# Patient Record
Sex: Female | Born: 1960 | Race: Black or African American | Hispanic: No | Marital: Married | Smoking: Never smoker
Health system: Southern US, Community
[De-identification: ages and names within clinical notes are randomized; demographics above are authoritative.]

## PROBLEM LIST (undated history)

## (undated) DIAGNOSIS — M51369 Other intervertebral disc degeneration, lumbar region without mention of lumbar back pain or lower extremity pain: Secondary | ICD-10-CM

## (undated) DIAGNOSIS — F4310 Post-traumatic stress disorder, unspecified: Secondary | ICD-10-CM

## (undated) DIAGNOSIS — M5136 Other intervertebral disc degeneration, lumbar region: Secondary | ICD-10-CM

## (undated) DIAGNOSIS — K219 Gastro-esophageal reflux disease without esophagitis: Secondary | ICD-10-CM

## (undated) DIAGNOSIS — M1990 Unspecified osteoarthritis, unspecified site: Secondary | ICD-10-CM

## (undated) DIAGNOSIS — H269 Unspecified cataract: Secondary | ICD-10-CM

## (undated) DIAGNOSIS — M5126 Other intervertebral disc displacement, lumbar region: Secondary | ICD-10-CM

## (undated) DIAGNOSIS — F329 Major depressive disorder, single episode, unspecified: Secondary | ICD-10-CM

## (undated) DIAGNOSIS — I10 Essential (primary) hypertension: Secondary | ICD-10-CM

## (undated) DIAGNOSIS — G4730 Sleep apnea, unspecified: Secondary | ICD-10-CM

## (undated) DIAGNOSIS — E1140 Type 2 diabetes mellitus with diabetic neuropathy, unspecified: Secondary | ICD-10-CM

## (undated) DIAGNOSIS — E7800 Pure hypercholesterolemia, unspecified: Secondary | ICD-10-CM

## (undated) DIAGNOSIS — F32A Depression, unspecified: Secondary | ICD-10-CM

## (undated) HISTORY — PX: CHOLECYSTECTOMY: SHX55

## (undated) HISTORY — DX: Unspecified cataract: H26.9

## (undated) HISTORY — PX: ABDOMINAL HYSTERECTOMY: SHX81

## (undated) HISTORY — PX: COLONOSCOPY: SHX174

---

## 1998-09-28 ENCOUNTER — Other Ambulatory Visit: Admission: RE | Admit: 1998-09-28 | Discharge: 1998-09-28 | Payer: Self-pay | Admitting: Obstetrics and Gynecology

## 1998-10-21 ENCOUNTER — Ambulatory Visit (HOSPITAL_COMMUNITY): Admission: RE | Admit: 1998-10-21 | Discharge: 1998-10-21 | Payer: Self-pay | Admitting: *Deleted

## 1999-01-19 ENCOUNTER — Inpatient Hospital Stay (HOSPITAL_COMMUNITY): Admission: RE | Admit: 1999-01-19 | Discharge: 1999-01-22 | Payer: Self-pay | Admitting: *Deleted

## 1999-02-14 ENCOUNTER — Encounter: Payer: Self-pay | Admitting: Emergency Medicine

## 1999-02-14 ENCOUNTER — Emergency Department (HOSPITAL_COMMUNITY): Admission: EM | Admit: 1999-02-14 | Discharge: 1999-02-14 | Payer: Self-pay | Admitting: Emergency Medicine

## 2000-08-22 ENCOUNTER — Encounter: Payer: Self-pay | Admitting: General Surgery

## 2000-08-22 ENCOUNTER — Encounter: Admission: RE | Admit: 2000-08-22 | Discharge: 2000-08-22 | Payer: Self-pay | Admitting: General Surgery

## 2000-09-09 ENCOUNTER — Encounter (INDEPENDENT_AMBULATORY_CARE_PROVIDER_SITE_OTHER): Payer: Self-pay | Admitting: Specialist

## 2000-09-09 ENCOUNTER — Observation Stay (HOSPITAL_COMMUNITY): Admission: RE | Admit: 2000-09-09 | Discharge: 2000-09-10 | Payer: Self-pay | Admitting: General Surgery

## 2001-02-28 ENCOUNTER — Inpatient Hospital Stay (HOSPITAL_COMMUNITY): Admission: EM | Admit: 2001-02-28 | Discharge: 2001-03-01 | Payer: Self-pay | Admitting: *Deleted

## 2001-03-01 ENCOUNTER — Encounter: Payer: Self-pay | Admitting: *Deleted

## 2001-06-09 ENCOUNTER — Other Ambulatory Visit: Admission: RE | Admit: 2001-06-09 | Discharge: 2001-06-09 | Payer: Self-pay | Admitting: *Deleted

## 2002-05-03 ENCOUNTER — Emergency Department (HOSPITAL_COMMUNITY): Admission: EM | Admit: 2002-05-03 | Discharge: 2002-05-03 | Payer: Self-pay | Admitting: Emergency Medicine

## 2002-05-03 ENCOUNTER — Encounter: Payer: Self-pay | Admitting: Emergency Medicine

## 2002-07-30 ENCOUNTER — Ambulatory Visit (HOSPITAL_COMMUNITY): Admission: RE | Admit: 2002-07-30 | Discharge: 2002-07-30 | Payer: Self-pay | Admitting: Pulmonary Disease

## 2003-03-18 ENCOUNTER — Ambulatory Visit (HOSPITAL_COMMUNITY): Admission: RE | Admit: 2003-03-18 | Discharge: 2003-03-18 | Payer: Self-pay | Admitting: Pulmonary Disease

## 2003-06-04 ENCOUNTER — Ambulatory Visit (HOSPITAL_COMMUNITY): Admission: RE | Admit: 2003-06-04 | Discharge: 2003-06-04 | Payer: Self-pay | Admitting: Pulmonary Disease

## 2003-12-15 ENCOUNTER — Other Ambulatory Visit: Admission: RE | Admit: 2003-12-15 | Discharge: 2003-12-15 | Payer: Self-pay | Admitting: *Deleted

## 2003-12-31 ENCOUNTER — Emergency Department (HOSPITAL_COMMUNITY): Admission: EM | Admit: 2003-12-31 | Discharge: 2003-12-31 | Payer: Self-pay | Admitting: Emergency Medicine

## 2004-11-17 ENCOUNTER — Inpatient Hospital Stay (HOSPITAL_COMMUNITY): Admission: EM | Admit: 2004-11-17 | Discharge: 2004-11-18 | Payer: Self-pay | Admitting: Emergency Medicine

## 2004-11-17 ENCOUNTER — Ambulatory Visit: Payer: Self-pay | Admitting: *Deleted

## 2004-11-29 ENCOUNTER — Ambulatory Visit: Payer: Self-pay | Admitting: Cardiology

## 2005-03-14 ENCOUNTER — Emergency Department (HOSPITAL_COMMUNITY): Admission: EM | Admit: 2005-03-14 | Discharge: 2005-03-14 | Payer: Self-pay | Admitting: Emergency Medicine

## 2005-05-24 ENCOUNTER — Other Ambulatory Visit: Admission: RE | Admit: 2005-05-24 | Discharge: 2005-05-24 | Payer: Self-pay | Admitting: Obstetrics and Gynecology

## 2006-02-20 ENCOUNTER — Observation Stay (HOSPITAL_COMMUNITY): Admission: AD | Admit: 2006-02-20 | Discharge: 2006-02-21 | Payer: Self-pay | Admitting: Cardiology

## 2006-02-20 ENCOUNTER — Ambulatory Visit: Payer: Self-pay | Admitting: Cardiology

## 2007-06-11 ENCOUNTER — Encounter (HOSPITAL_COMMUNITY): Payer: Self-pay | Admitting: Obstetrics and Gynecology

## 2007-06-12 ENCOUNTER — Ambulatory Visit (HOSPITAL_COMMUNITY): Admission: RE | Admit: 2007-06-12 | Discharge: 2007-06-12 | Payer: Self-pay | Admitting: Obstetrics and Gynecology

## 2008-09-20 ENCOUNTER — Ambulatory Visit (HOSPITAL_COMMUNITY): Admission: RE | Admit: 2008-09-20 | Discharge: 2008-09-20 | Payer: Self-pay | Admitting: Pulmonary Disease

## 2008-11-25 ENCOUNTER — Ambulatory Visit: Payer: Self-pay | Admitting: Internal Medicine

## 2008-12-20 ENCOUNTER — Ambulatory Visit (HOSPITAL_COMMUNITY): Admission: RE | Admit: 2008-12-20 | Discharge: 2008-12-20 | Payer: Self-pay | Admitting: Internal Medicine

## 2008-12-20 ENCOUNTER — Ambulatory Visit: Payer: Self-pay | Admitting: Internal Medicine

## 2008-12-21 ENCOUNTER — Encounter: Payer: Self-pay | Admitting: Internal Medicine

## 2008-12-21 LAB — CONVERTED CEMR LAB: Creatinine, Ser: 0.63 mg/dL (ref 0.40–1.20)

## 2008-12-24 ENCOUNTER — Ambulatory Visit (HOSPITAL_COMMUNITY): Admission: RE | Admit: 2008-12-24 | Discharge: 2008-12-24 | Payer: Self-pay | Admitting: Internal Medicine

## 2009-03-10 ENCOUNTER — Ambulatory Visit (HOSPITAL_COMMUNITY): Admission: RE | Admit: 2009-03-10 | Discharge: 2009-03-10 | Payer: Self-pay | Admitting: Pulmonary Disease

## 2009-04-12 ENCOUNTER — Encounter: Payer: Self-pay | Admitting: Internal Medicine

## 2009-05-08 ENCOUNTER — Emergency Department (HOSPITAL_COMMUNITY): Admission: EM | Admit: 2009-05-08 | Discharge: 2009-05-08 | Payer: Self-pay | Admitting: Emergency Medicine

## 2009-10-12 ENCOUNTER — Emergency Department (HOSPITAL_COMMUNITY): Admission: EM | Admit: 2009-10-12 | Discharge: 2009-10-12 | Payer: Self-pay | Admitting: Emergency Medicine

## 2009-10-23 ENCOUNTER — Emergency Department (HOSPITAL_COMMUNITY): Admission: EM | Admit: 2009-10-23 | Discharge: 2009-10-23 | Payer: Self-pay | Admitting: Emergency Medicine

## 2010-06-04 ENCOUNTER — Inpatient Hospital Stay (HOSPITAL_COMMUNITY): Admission: EM | Admit: 2010-06-04 | Discharge: 2010-06-06 | Payer: Self-pay | Admitting: Emergency Medicine

## 2010-07-03 ENCOUNTER — Ambulatory Visit (HOSPITAL_COMMUNITY): Admission: RE | Admit: 2010-07-03 | Discharge: 2010-07-03 | Payer: Self-pay | Admitting: Family Medicine

## 2010-07-28 ENCOUNTER — Ambulatory Visit: Admission: RE | Admit: 2010-07-28 | Discharge: 2010-07-28 | Payer: Self-pay | Admitting: Pulmonary Disease

## 2010-08-05 ENCOUNTER — Emergency Department (HOSPITAL_COMMUNITY): Admission: EM | Admit: 2010-08-05 | Discharge: 2010-08-05 | Payer: Self-pay | Admitting: Emergency Medicine

## 2010-10-26 ENCOUNTER — Emergency Department (HOSPITAL_COMMUNITY): Admission: EM | Admit: 2010-10-26 | Discharge: 2010-10-26 | Payer: Self-pay | Admitting: Emergency Medicine

## 2011-02-22 LAB — URINE MICROSCOPIC-ADD ON

## 2011-02-22 LAB — CBC
HCT: 42 % (ref 36.0–46.0)
Hemoglobin: 13.7 g/dL (ref 12.0–15.0)
MCH: 26.5 pg (ref 26.0–34.0)
MCHC: 32.5 g/dL (ref 30.0–36.0)
MCV: 81.7 fL (ref 78.0–100.0)
Platelets: 290 10*3/uL (ref 150–400)
RBC: 5.15 MIL/uL — ABNORMAL HIGH (ref 3.87–5.11)
RDW: 14.8 % (ref 11.5–15.5)
WBC: 6.9 10*3/uL (ref 4.0–10.5)

## 2011-02-22 LAB — URINALYSIS, ROUTINE W REFLEX MICROSCOPIC
Bilirubin Urine: NEGATIVE
Glucose, UA: >1000 mg/dL — AB
Hgb urine dipstick: NEGATIVE
Ketones, ur: NEGATIVE mg/dL
Leukocytes, UA: NEGATIVE
Nitrite: NEGATIVE
Protein, ur: NEGATIVE mg/dL
Specific Gravity, Urine: 1.025 (ref 1.005–1.030)
Urobilinogen, UA: 0.2 mg/dL (ref 0.0–1.0)
pH: 5.5 (ref 5.0–8.0)

## 2011-02-22 LAB — DIFFERENTIAL
Basophils Absolute: 0 10*3/uL (ref 0.0–0.1)
Basophils Relative: 0 % (ref 0–1)
Eosinophils Absolute: 0 10*3/uL (ref 0.0–0.7)
Eosinophils Relative: 0 % (ref 0–5)
Lymphocytes Relative: 25 % (ref 12–46)
Lymphs Abs: 1.7 10*3/uL (ref 0.7–4.0)
Monocytes Absolute: 0.3 10*3/uL (ref 0.1–1.0)
Monocytes Relative: 5 % (ref 3–12)
Neutro Abs: 4.8 10*3/uL (ref 1.7–7.7)
Neutrophils Relative %: 70 % (ref 43–77)

## 2011-02-22 LAB — COMPREHENSIVE METABOLIC PANEL
ALT: 19 U/L (ref 0–35)
AST: 23 U/L (ref 0–37)
Albumin: 3.7 g/dL (ref 3.5–5.2)
Alkaline Phosphatase: 69 U/L (ref 39–117)
BUN: 10 mg/dL (ref 6–23)
CO2: 24 mEq/L (ref 19–32)
Calcium: 8.8 mg/dL (ref 8.4–10.5)
Chloride: 97 mEq/L (ref 96–112)
Creatinine, Ser: 0.72 mg/dL (ref 0.4–1.2)
GFR calc Af Amer: >60 mL/min (ref >60)
GFR calc non Af Amer: >60 mL/min (ref >60)
Glucose, Bld: 260 mg/dL — ABNORMAL HIGH (ref 70–99)
Potassium: 3.5 mEq/L (ref 3.5–5.1)
Sodium: 131 mEq/L — ABNORMAL LOW (ref 135–145)
Total Bilirubin: 0.4 mg/dL (ref 0.3–1.2)
Total Protein: 7.5 g/dL (ref 6.0–8.3)

## 2011-02-22 LAB — LIPASE, BLOOD: Lipase: 25 U/L (ref 11–59)

## 2011-02-25 LAB — GLUCOSE, CAPILLARY
Glucose-Capillary: 224 mg/dL — ABNORMAL HIGH (ref 70–99)
Glucose-Capillary: 239 mg/dL — ABNORMAL HIGH (ref 70–99)
Glucose-Capillary: 272 mg/dL — ABNORMAL HIGH (ref 70–99)
Glucose-Capillary: 280 mg/dL — ABNORMAL HIGH (ref 70–99)
Glucose-Capillary: 288 mg/dL — ABNORMAL HIGH (ref 70–99)

## 2011-02-25 LAB — BASIC METABOLIC PANEL
BUN: 9 mg/dL (ref 6–23)
CO2: 26 mEq/L (ref 19–32)
Calcium: 8.8 mg/dL (ref 8.4–10.5)
Chloride: 100 mEq/L (ref 96–112)
Creatinine, Ser: 0.68 mg/dL (ref 0.4–1.2)
GFR calc Af Amer: >60 mL/min (ref >60)
GFR calc non Af Amer: >60 mL/min (ref >60)
Glucose, Bld: 214 mg/dL — ABNORMAL HIGH (ref 70–99)
Potassium: 3.9 mEq/L (ref 3.5–5.1)
Sodium: 132 mEq/L — ABNORMAL LOW (ref 135–145)

## 2011-02-25 LAB — PROTIME-INR
INR: 0.94 (ref 0.00–1.49)
Prothrombin Time: 12.8 seconds (ref 11.6–15.2)

## 2011-02-25 LAB — CBC
HCT: 37.5 % (ref 36.0–46.0)
Hemoglobin: 12.6 g/dL (ref 12.0–15.0)
MCH: 27.3 pg (ref 26.0–34.0)
MCHC: 33.6 g/dL (ref 30.0–36.0)
MCV: 81.4 fL (ref 78.0–100.0)
Platelets: 235 10*3/uL (ref 150–400)
RBC: 4.61 MIL/uL (ref 3.87–5.11)
RDW: 14.4 % (ref 11.5–15.5)
WBC: 5.3 10*3/uL (ref 4.0–10.5)

## 2011-02-25 LAB — LIPID PANEL
HDL: 50 mg/dL (ref >39)
Total CHOL/HDL Ratio: 4.9 RATIO
Triglycerides: 286 mg/dL — ABNORMAL HIGH (ref <150)
VLDL: 57 mg/dL — ABNORMAL HIGH (ref 0–40)

## 2011-02-25 LAB — DIFFERENTIAL
Basophils Absolute: 0 10*3/uL (ref 0.0–0.1)
Basophils Relative: 1 % (ref 0–1)
Eosinophils Absolute: 0.1 10*3/uL (ref 0.0–0.7)
Eosinophils Relative: 1 % (ref 0–5)
Lymphocytes Relative: 45 % (ref 12–46)
Lymphs Abs: 2.4 10*3/uL (ref 0.7–4.0)
Monocytes Absolute: 0.4 10*3/uL (ref 0.1–1.0)
Monocytes Relative: 8 % (ref 3–12)
Neutro Abs: 2.4 10*3/uL (ref 1.7–7.7)
Neutrophils Relative %: 45 % (ref 43–77)

## 2011-02-25 LAB — POCT CARDIAC MARKERS: CKMB, poc: <1 ng/mL — ABNORMAL LOW (ref 1.0–8.0)

## 2011-02-25 LAB — APTT: aPTT: 29 seconds (ref 24–37)

## 2011-03-05 ENCOUNTER — Emergency Department (HOSPITAL_COMMUNITY)
Admission: EM | Admit: 2011-03-05 | Discharge: 2011-03-06 | Disposition: A | Discharge status: Home / Self Care | Payer: Medicare Other | Attending: Emergency Medicine | Admitting: Emergency Medicine

## 2011-03-05 DIAGNOSIS — M549 Dorsalgia, unspecified: Secondary | ICD-10-CM | POA: Insufficient documentation

## 2011-03-05 DIAGNOSIS — R197 Diarrhea, unspecified: Secondary | ICD-10-CM | POA: Insufficient documentation

## 2011-03-05 DIAGNOSIS — Z79899 Other long term (current) drug therapy: Secondary | ICD-10-CM | POA: Insufficient documentation

## 2011-03-05 DIAGNOSIS — R112 Nausea with vomiting, unspecified: Secondary | ICD-10-CM | POA: Insufficient documentation

## 2011-03-05 LAB — COMPREHENSIVE METABOLIC PANEL
AST: 23 U/L (ref 0–37)
Albumin: 3.7 g/dL (ref 3.5–5.2)
BUN: 13 mg/dL (ref 6–23)
Chloride: 98 mEq/L (ref 96–112)
Creatinine, Ser: 0.79 mg/dL (ref 0.4–1.2)
GFR calc Af Amer: >60 mL/min (ref >60)
Potassium: 4.3 mEq/L (ref 3.5–5.1)
Total Protein: 7.5 g/dL (ref 6.0–8.3)

## 2011-03-05 LAB — CBC
MCH: 26.5 pg (ref 26.0–34.0)
MCV: 79.6 fL (ref 78.0–100.0)
Platelets: 237 10*3/uL (ref 150–400)
RBC: 5.2 MIL/uL — ABNORMAL HIGH (ref 3.87–5.11)
RDW: 13.8 % (ref 11.5–15.5)
WBC: 6.3 10*3/uL (ref 4.0–10.5)

## 2011-03-05 LAB — DIFFERENTIAL
Basophils Relative: 0 % (ref 0–1)
Eosinophils Absolute: 0 10*3/uL (ref 0.0–0.7)
Eosinophils Relative: 0 % (ref 0–5)
Lymphs Abs: 0.4 10*3/uL — ABNORMAL LOW (ref 0.7–4.0)
Neutrophils Relative %: 91 % — ABNORMAL HIGH (ref 43–77)

## 2011-03-14 LAB — BASIC METABOLIC PANEL
BUN: 8 mg/dL (ref 6–23)
Chloride: 100 mEq/L (ref 96–112)
Chloride: 104 mEq/L (ref 96–112)
GFR calc Af Amer: >60 mL/min (ref >60)
GFR calc non Af Amer: >60 mL/min (ref >60)
GFR calc non Af Amer: >60 mL/min (ref >60)
Potassium: 3.4 mEq/L — ABNORMAL LOW (ref 3.5–5.1)
Potassium: 3.7 mEq/L (ref 3.5–5.1)
Sodium: 132 mEq/L — ABNORMAL LOW (ref 135–145)
Sodium: 136 mEq/L (ref 135–145)

## 2011-03-14 LAB — DIFFERENTIAL
Eosinophils Relative: 2 % (ref 0–5)
Eosinophils Relative: 2 % (ref 0–5)
Lymphocytes Relative: 44 % (ref 12–46)
Lymphocytes Relative: 55 % — ABNORMAL HIGH (ref 12–46)
Lymphs Abs: 1.8 10*3/uL (ref 0.7–4.0)
Lymphs Abs: 1.8 10*3/uL (ref 0.7–4.0)
Monocytes Absolute: 0.2 10*3/uL (ref 0.1–1.0)
Monocytes Relative: 5 % (ref 3–12)
Monocytes Relative: 9 % (ref 3–12)

## 2011-03-14 LAB — URINALYSIS, ROUTINE W REFLEX MICROSCOPIC
Bilirubin Urine: NEGATIVE
Glucose, UA: >1000 mg/dL — AB
Glucose, UA: NEGATIVE mg/dL
Hgb urine dipstick: NEGATIVE
Hgb urine dipstick: NEGATIVE
Ketones, ur: NEGATIVE mg/dL
Ketones, ur: NEGATIVE mg/dL
Leukocytes, UA: NEGATIVE
pH: 5.5 (ref 5.0–8.0)
pH: 5.5 (ref 5.0–8.0)

## 2011-03-14 LAB — CBC
HCT: 38.1 % (ref 36.0–46.0)
HCT: 39.2 % (ref 36.0–46.0)
Hemoglobin: 13 g/dL (ref 12.0–15.0)
Hemoglobin: 13.3 g/dL (ref 12.0–15.0)
MCV: 80.2 fL (ref 78.0–100.0)
MCV: 80.6 fL (ref 78.0–100.0)
Platelets: 220 10*3/uL (ref 150–400)
RBC: 4.75 MIL/uL (ref 3.87–5.11)
RBC: 4.87 MIL/uL (ref 3.87–5.11)
WBC: 3.2 10*3/uL — ABNORMAL LOW (ref 4.0–10.5)
WBC: 4 10*3/uL (ref 4.0–10.5)

## 2011-03-14 LAB — GLUCOSE, CAPILLARY
Glucose-Capillary: 158 mg/dL — ABNORMAL HIGH (ref 70–99)
Glucose-Capillary: 169 mg/dL — ABNORMAL HIGH (ref 70–99)
Glucose-Capillary: 283 mg/dL — ABNORMAL HIGH (ref 70–99)

## 2011-03-14 LAB — KETONES, QUALITATIVE: Acetone, Bld: NEGATIVE

## 2011-03-20 LAB — BASIC METABOLIC PANEL
CO2: 24 mEq/L (ref 19–32)
Chloride: 100 mEq/L (ref 96–112)
GFR calc Af Amer: >60 mL/min (ref >60)
Sodium: 134 mEq/L — ABNORMAL LOW (ref 135–145)

## 2011-03-20 LAB — URINALYSIS, ROUTINE W REFLEX MICROSCOPIC
Bilirubin Urine: NEGATIVE
Glucose, UA: >1000 mg/dL — AB
Ketones, ur: NEGATIVE mg/dL
Leukocytes, UA: NEGATIVE
Nitrite: NEGATIVE
Protein, ur: NEGATIVE mg/dL
Specific Gravity, Urine: 1.02 (ref 1.005–1.030)
Urobilinogen, UA: 0.2 mg/dL (ref 0.0–1.0)
pH: 5.5 (ref 5.0–8.0)

## 2011-03-20 LAB — DIFFERENTIAL
Basophils Absolute: 0 K/uL (ref 0.0–0.1)
Basophils Relative: 1 % (ref 0–1)
Eosinophils Absolute: 0.1 K/uL (ref 0.0–0.7)
Eosinophils Relative: 2 % (ref 0–5)
Lymphocytes Relative: 50 % — ABNORMAL HIGH (ref 12–46)
Lymphs Abs: 2.3 K/uL (ref 0.7–4.0)
Monocytes Absolute: 0.3 K/uL (ref 0.1–1.0)
Monocytes Relative: 7 % (ref 3–12)
Neutro Abs: 1.9 K/uL (ref 1.7–7.7)
Neutrophils Relative %: 40 % — ABNORMAL LOW (ref 43–77)

## 2011-03-20 LAB — GLUCOSE, CAPILLARY: Glucose-Capillary: 281 mg/dL — ABNORMAL HIGH (ref 70–99)

## 2011-03-20 LAB — CBC
HCT: 38.3 % (ref 36.0–46.0)
Hemoglobin: 13.2 g/dL (ref 12.0–15.0)
MCHC: 34.4 g/dL (ref 30.0–36.0)
MCV: 80.6 fL (ref 78.0–100.0)
Platelets: 224 K/uL (ref 150–400)
RBC: 4.75 MIL/uL (ref 3.87–5.11)
RDW: 14.1 % (ref 11.5–15.5)
WBC: 4.7 K/uL (ref 4.0–10.5)

## 2011-03-20 LAB — URINE MICROSCOPIC-ADD ON

## 2011-03-20 LAB — WET PREP, GENITAL
Trich, Wet Prep: NONE SEEN
WBC, Wet Prep HPF POC: NONE SEEN

## 2011-03-20 LAB — SYPHILIS: RPR W/REFLEX TO RPR TITER AND TREPONEMAL ANTIBODIES, TRADITIONAL SCREENING AND DIAGNOSIS ALGORITHM: RPR Ser Ql: NONREACTIVE

## 2011-03-26 LAB — GLUCOSE, CAPILLARY: Glucose-Capillary: 203 mg/dL — ABNORMAL HIGH (ref 70–99)

## 2011-04-24 NOTE — Op Note (Signed)
NAMESTEPHANI, Hannah Schultz              ACCOUNT NO.:  [id number]   MEDICAL RECORD NO.:  [id number]          PATIENT TYPE:  AMB   LOCATION:  [place]                           FACILITY:  [facility]   PHYSICIAN:  Marylynn Pearson, MD    DATE OF BIRTH:  1961-11-09   DATE OF PROCEDURE:  06/12/2007  DATE OF DISCHARGE:                               OPERATIVE REPORT   PREOPERATIVE DIAGNOSIS:  Right labial nodules.   POSTOPERATIVE DIAGNOSIS:  Right labial cysts x2.   PROCEDURE:  Excision of two right labial cysts.   SURGEON:  Marylynn Pearson, M.D.   FINDINGS:  Inferior right labial cyst with thick chocolate-like  substance, superior cyst excised intact with clear fluid within.   ANESTHESIA:  General.   SPECIMEN:  Right labial cyst and cyst wall.   ESTIMATED BLOOD LOSS:  Minimal.   COMPLICATIONS:  None.   CONDITION:  Stable and extubated to recovery room.   PROCEDURE IN DETAIL:  Hannah Schultz was taken to the operating room where  general anesthesia was obtained. She was placed in the dorsal lithotomy  position using Allen stirrups.  She was prepped and draped in sterile  fashion and a catheter was used to drain her bladder for approximately  50 mL of clear urine.  Right labial cysts were identified.  Two Allis  clamps were placed on either end of the inferior right labial cyst and  incision was made with the scalpel.  The cyst wall was separated from  the overlying epithelium.  During manipulation of the cyst, the cyst  ruptured and drained a thick chocolate looking substance.  The cyst wall  was then removed and our attention was turned to the superior right  labial cyst. Again, approximately 5 mm in size, two Allis clamps were  placed on either and an incision was made atop the cyst and the cyst was  excised using blunt and sharp dissection with Metzenbaum scissors.  The  cyst was excised intact.  Both were placed on Telfa and sent off for  pathology.  Due to the proximity of the superior  right  labial cyst to the urethra, a catheter was placed again in the  bladder.  The epithelium was reapproximated using a 3-0 Vicryl Rapide in  a running locked fashion.  Hemostasis was assured, the catheter was  easily removed, and the patient was taken to the recovery room in stable  condition.      Marylynn Pearson, MD  Electronically Signed     GA/MEDQ  D:  06/12/2007  T:  06/12/2007  Job:  [id number]

## 2011-04-24 NOTE — Op Note (Signed)
NAMECORINE, Hannah Schultz              ACCOUNT NO.:  [id number]   MEDICAL RECORD NO.:  [id number]          PATIENT TYPE:  AMB   LOCATION:  DAY                           FACILITY:  [facility]   PHYSICIAN:  R. Garfield Cornea, M.D. DATE OF BIRTH:  11-22-1961   DATE OF PROCEDURE:  DATE OF DISCHARGE:                               OPERATIVE REPORT   INDICATIONS FOR PROCEDURE:  A 50 year old lady with burning left lower  quadrant abdominal pain.  Otherwise, she does not have any GI symptoms.  Colonoscopy is now being done.  Family history is positive for rectal  polyps in her sister at age 76.  Colonoscopy is now being done.  Risks,  benefits, alternatives, limitations have been reviewed, questions  answered.  She is agreeable.  Please see the documentation in the  medical record.   PROCEDURE NOTE:  O2 saturation, blood pressure, pulse, respirations were  monitored throughout the entire procedure.   CONSCIOUS SEDATION:  Versed 2 mg IV, Demerol 50 mg IV, in a single dose.   INSTRUMENT:  Pentax video chip system.   FINDINGS:  Digital rectal exam revealed no abnormalities.  Endoscopic  findings:  Prep was good.  Colon:  Colonic mucosa was surveyed from the  rectosigmoid junction through the left transverse, right colon,  appendiceal orifice, ileocecal valve, and cecum.  These structures were  well seen and photographed for the record.  Terminal ileum will be  intubated 5 cm.  From this level, scope was withdrawn.  All previously  mentioned mucosal surfaces were again seen.  The patient had few  scattered pancolonic diverticula.  Remainder of colonic mucosa appeared  normal.  Scope was pulled down the rectum.  A thorough examination of  the rectal mucosa including retroflexed view of the anal verge  demonstrated no abnormalities.  The patient tolerated the procedure very  well and was reactive to Endoscopy.   IMPRESSION:  Normal rectum, very few scattered pancolonic diverticula,  and colonic mucosa  appeared normal.  There was really nothing in her  colon that would explain her symptoms which may be neuropathic in  origin.  She does have a diabetic neuropathy and is already on  Neurontin.   RECOMMENDATIONS:  We will do an abdominopelvic CT with IV normal  contrast.  If this is not revealing for any underlying pathology which  could be attributable to her left-sided abdominal pain, then I would  then recommended she be seen by a pain management specialist.      Bridgette Habermann, M.D.  Electronically Signed     RMR/MEDQ  D:  12/20/2008  T:  12/21/2008  Job:  [id number]   cc:   Percell Miller L. Luan Pulling, M.D.  Fax: [phone]

## 2011-04-24 NOTE — H&P (Signed)
[name], [name]              ACCOUNT NO.:  [id number]   MEDICAL RECORD NO.:  [id number]          PATIENT TYPE:  AMB   LOCATION:  DAY                           FACILITY:  [facility]   PHYSICIAN:  R. Garfield Cornea, M.D. DATE OF BIRTH:  12/30/1960   DATE OF ADMISSION:  DATE OF DISCHARGE:  LH                              HISTORY & PHYSICAL   HISTORY OF PRESENT ILLNESS:  [name] is a very pleasant 50-  year-old obese African American female with long-standing poorly-  controlled diabetes mellitus, who presents today in referral from Dr.  Luan Pulling to further evaluate a 2-46-month history of left mid midabdomen  burning.  She describes this pain like someone pouring gasoline on her  left abdomen saying it blazed.  It is a hot burning sensation.  It does  not have any radiation.  It is localized to the left midabdomen where  she places her palm, does not come around from the back, is not  associated with movement, bowel function, or eating.  In fact, she has  no bowel symptoms.  No constipation, diarrhea, melena, or hematochezia.  No odynophagia, dysphagia, or sign of reflux symptoms.  She is  significantly over her ideal body weight.  She has diabetes for years  and has a hemoglobin A1c recently of 8.9.  She did not bring her  medication list with her and really cannot tell me what she is taking at  this point in time.  She also has similar burning in both hands, chest,  and lower extremities in a stocking glove distribution and occasionally  her legs go out from under her and she falls.  She had been told this is  related to her diabetes and she does have neuropathy.  There is no prior  history gastrointestinal illness.  She is status post cholecystectomy,  hysterectomy, ovarian cyst removal, removed in [place].  She was  having some rectal bleeding.   CURRENT MEDICATIONS:  Not known.  She is not on Coumadin.  She is not  sure if she is taking Neurontin.  To be reviewed.   ALLERGIES:  No known drug allergies.   FAMILY HISTORY:  Sister has rectal polyps at age 38.  No history of  colorectal cancer.  Mother with a history of CVA.  Father with history  of leukemia.   SOCIAL HISTORY:  The patient is married, has 2 healthy sons aged 22 and  45.  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  No tobacco.  No alcohol.   REVIEW OF SYSTEMS:  No chest pain, dyspnea on exertion.  No fever,  chills.   PHYSICAL EXAMINATION:  GENERAL:  Pleasant obese 50 year old [place]  American female, resting comfortably, 264, height 5 feet 5.  VITAL SIGNS:  Temperature 98.1, BP 130/78, pulse 72.  SKIN:  Warm, pink, and dry.  HEENT:  No scleral icterus.  JVD is not prominent.  CHEST:  Lungs are clear to auscultation.  CARDIAC:  Regular rate and rhythm without murmur, gallop, or rub.  BREAST:  Deferred.  ABDOMEN:  Obese,  positive bowel sounds, soft and nontender.  I  appreciate no rash or skin abnormalities.  No appreciable  hepatosplenomegaly or mass.  BACK:  There is no CVA tenderness.  EXTREMITIES:  No edema.  RECTAL:  Deferred colonoscopy.   IMPRESSION:  [name] is a pleasant 50 year old obese diabetic  with poorly controlled blood sugars, has a history of neuropathy, who  has had a 2-48-month history of nonradiating unremitting burning left-  sided abdominal wall pain.  I doubt this is a herpes zoster.  I doubt  this is a sign of any visceral GI pathology more likely this is part of  partial diabetic neuropathy.  I doubt that she has a diskogenic pain.   It is notable, however, that her sister has a history of rectal polyps  which were removed recently and [name] is now at the threshold  screening age for African Americans for colorectal cancer screening,  although I do not think at all there is anything in her colon that would  be contributing to her symptoms of left-sided abdominal pain.   RECOMMENDATIONS:  We will go  ahead and review her medications when they  become available.  I have offered [name] a screening colonoscopy in  the very near future.  I spent some time with her recommending the  importance of weight loss, exercise, and maximization of glycemic  control.  We will need to see whether or not she has been on Neurontin.  We consider a course of Lidoderm patch and therapy and potentially a  referral to a Pain Management Center.  We will make further  recommendations once I reviewed her medication list and an opportunity  to perform a colonoscopy.   I would like to thank Dr. Sinda Du for allowing me to see this  very nice lady in consultation.      Bridgette Habermann, M.D.  Electronically Signed     RMR/MEDQ  D:  11/25/2008  T:  11/26/2008  Job:  [id number]

## 2011-04-27 NOTE — Discharge Summary (Signed)
[name], Hannah Schultz              ACCOUNT NO.:  [id number]   MEDICAL RECORD NO.:  [id number]          PATIENT TYPE:  INP   LOCATION:  3709                         FACILITY:  [facility]   PHYSICIAN:  Kirk Ruths, M.D. LHCDATE OF BIRTH:  1961/05/26   DATE OF ADMISSION:  11/17/2004  DATE OF DISCHARGE:  11/18/2004                                 DISCHARGE SUMMARY   DISCHARGE DIAGNOSES:  1.  Chest pain, etiology unclear.  2.  Insignificant coronary artery disease by catheterization this admission.  3.  Good left ventricular function.  4.  Elevated D-dimer with chest CT negative for pulmonary embolism this      admission.  5.  Hypertension.  6.  History of panic attacks.   HOSPITAL COURSE:  Please seen consultation note from November 17, 2004.  Briefly, this 50 year old patient was transferred from [facility]  on November 17, 2004, for further evaluation of chest discomfort.  Her  cardiac enzymes were negative for myocardial infarction.  She had an  echocardiogram performed at [facility] that showed distal  anteroseptal hypokinesis and good LV function.  She was transferred to [facility] for cardiac catheterization.  This was done by Dr. Vicenta Aly  on December 9.  She had some spasm in the ostial circumflex relieved with  intracoronary nitroglycerin.  She had distal 30% stenosis in the LAD.  She  had no significant coronary artery disease by catheterization.  D-dimer was  checked.  This was elevated at 0.64.  The patient underwent chest CT on  November 18, 2004.  This showed no pulmonary embolus, linear atelectasis in  the lower lobes.  Therefore, it was felt the patient was stable enough for  discharge to home. She will resume her same home medications and follow up  with the PA for Dr. Wilhemina Cash in [place] in a couple of weeks.   The patient had hypokalemia upon admission at [facility] and was  placed on potassium supplementation.  This will be  discontinued at  discharge, and the patient has been asked to eat potassium-containing foods  at home to help her potassium level stay normal.   LABORATORY AND X-RAY DATA:  White count 3900, hemoglobin 11.3, hematocrit  34.1, platelet count 225,000.  D-dimer as noted above.  INR 0.9.  Sodium  139, potassium 4, chloride 107, CO2 20, glucose 102, BUN 6, creatinine 0.8.  Total bilirubin 0.7, alkaline phosphatase 54, AST 25, ALT 20, total protein  6.3, albumin 3.2, calcium 8.4. Total cholesterol 202, triglycerides 207, HDL  46, LDL 102.  TSH 0.902.   Chest CT as noted above.   Chest x-ray from December 8:  No acute cardiopulmonary findings.   DISCHARGE MEDICATIONS:  Mavik 1 mg daily.   PAIN MANAGEMENT:  Tylenol as needed.   ACTIVITY:  No driving, heavy lifting, exertion, work, or sex for two days.   DIET:  Low-fat, low-sodium.  He has been advised to eat an extra banana  every other day to keep potassium level up.   WOUND CARE:  The patient is to call our office in [place]  for any groin  swelling, pain, or bruising.   FOLLOW UP:  She can see Dr. Luan Pulling as needed.  She will see the physician  assistant for Dr. Wilhemina Cash on Wednesday, December 21, at 1 p.m.       SW/MEDQ  D:  11/18/2004  T:  11/18/2004  Job:  [id number]   cc:   Percell Miller L. Luan Pulling, M.D.  [address]  [place]  [place] [address]  Fax: [phone]   Scarlett Presto, M.D.  Fax: [phone]

## 2011-04-27 NOTE — Cardiovascular Report (Signed)
Hannah Schultz, Hannah Schultz              ACCOUNT NO.:  [id number]   MEDICAL RECORD NO.:  [id number]          PATIENT TYPE:  INP   LOCATION:  3709                         FACILITY:  [facility]   PHYSICIAN:  Junious Silk, M.D. LHCDATE OF BIRTH:  12-13-1960   DATE OF PROCEDURE:  11/17/2004  DATE OF DISCHARGE:                              CARDIAC CATHETERIZATION   PROCEDURE PERFORMED:  Left heart catheterization with coronary angiography  and left ventriculography.   INDICATION:  Hannah Schultz is a 50 year old woman with cardiac risk factors.  She was admitted to [facility] with substernal chest pain.  Cardiac  markers showed an elevation of total CK and CK-MB.  However, the relative  index of the CK-MB was less than 3.  Troponin levels were all normal.  An  echocardiogram was read by Dr. Wilhemina Cash and felt to show a possible anterior  septal hypokinesis.  She was therefore referred to [facility] for  cardiac catheterization to rule out coronary artery disease.   CATHETERIZATION PROCEDURAL NOTE:  A 6 French sheath was placed in the right  femoral artery.  The patient had a very short left main coronary artery and  our catheter tended to subselect the left circumflex.  We initially  performed left diagnostic images with a 6 French JL-3.5 catheter.  We then  switched out to a JL-3 guiding catheter for better visualization of the left  anterior descending artery.  The right coronary artery was imaged with a JR-  4 catheter.  Left ventriculography was performed with angled pigtail  catheter.  Contrast was Omnipaque.  There were no complications.   CATHETERIZATION RESULTS:   HEMODYNAMICS:  1.  Left ventricular pressure 126/18.  2.  Aortic pressure 126/84.  3.  There is no aortic valve gradient.   LEFT VENTRICULOGRAM:  Wall motion is normal.  Ejection fraction estimated at  greater than or equal to 65%.  There is no mitral regurgitation.   CORONARY ARTERIOGRAPHY (RIGHT DOMINANT):   Left main is short, but normal.   Left anterior descending artery is a very tortuous vessel.  It gives rise to  two small diagonal branches.  The distal LAD has a tubular 30% stenosis.   Left circumflex had some catheter related spasm in the ostium which was  relieved with intracoronary nitroglycerin.  Circumflex was otherwise normal  giving rise to a large first obtuse marginal, normal size second obtuse  marginal, and a small posterior lateral branch.   Right coronary artery is a dominant vessel giving rise to a large posterior  descending and a very small posterior lateral branch.   IMPRESSION:  1.  Normal left ventricular systolic function.  2.  No significant coronary artery disease identified.   PLAN:  Alternative etiologies for the patient's chest pain will be  investigated.      Mark   MWP/MEDQ  D:  11/17/2004  T:  11/18/2004  Job:  [id number]   cc:   Percell Miller L. Luan Pulling, M.D.  [address]  [place]  [place] [address]  Fax: [phone]   Scarlett Presto, M.D.  Fax: [phone]

## 2011-04-27 NOTE — Consult Note (Signed)
Hannah, Schultz              ACCOUNT NO.:  [id number]   MEDICAL RECORD NO.:  [id number]          PATIENT TYPE:  INP   LOCATION:  IC07                          FACILITY:  [facility]   PHYSICIAN:  Scarlett Presto, M.D.   DATE OF BIRTH:  03/25/1961   DATE OF CONSULTATION:  11/17/2004  DATE OF DISCHARGE:                                   CONSULTATION   PRIMARY CARE PHYSICIAN:  Dr. Sinda Du.   HISTORY OF PRESENT ILLNESS:  [name] is a 50 year old female with no  known coronary artery disease who presents to [facility] with chest  discomfort.  She has been having intermittent discomfort for the last three  days.  She notes deep pressure substernally with occasional sharp pain with  shortness of breath, nausea, diaphoresis.  She reports the pain is  progressive over the last couple of days.  It got so bad she reported to the  emergency department where she was admitted, evaluated and started on  nitroglycerin drip.  The pain worsens with exertion.  It does radiate down  both of her arms. It is associated with some low-back pain.  She has dyspnea  on exertion but no orthopnea, PND or lower-extremity edema.  She will be  given morphine and nitroglycerin in the ER with some relief of the pain, but  it has now returned, and it is equal in intensity.  Prior to admission, she  was on Mavik 1 mg a day.   PAST MEDICAL HISTORY:  Significant for hypertension, history of panic  attacks, hyperlipidemia, history of laparoscopic cholecystectomy in the past  which was without significance.  Back in 2002, she was seen in the hospital  for chest discomfort.  She had a stress Cardiolite which showed a normal  ejection fraction, no ischemia, no evidence of scar, no wall motion  abnormalities.  In the hospital, she was on Protonix, K-Dur, nitroglycerin  drip and Mavik.   SOCIAL HISTORY:  She lives in [place].  She has two children.  Both of  them are healthy.  Her mother died of cardiac  sudden death in her 51's.  Father died in his 32's of coronary artery disease.  She has a sister who  had a recent myocardial infarction in her 82's and a brother who died of  cardiac sudden death at age 29.  She works at [employer].  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   REVIEW OF SYSTEMS:  Generally negative except for that reviewed in the  history of present illness.   PHYSICAL EXAMINATION:  GENERAL:  She is an obese, black female, in mild  distress secondary to her discomfort.  VITAL SIGNS:  She weighs 255 pounds.  Pulse 71.  Respirations 20.  Blood  pressure 115/71.  She is saturating 99% on room air.  HEENT:  Examination of the head, ears, eyes, nose and throat is  unremarkable.  NECK:  Supple.  There is no jugular venous distension or carotid bruits.  CARDIAC:  Exam is regular.  She has a soft, systolic ejection murmur heard  best at the  upper sternal border.  She has an S4 but no S3.  ABDOMEN:  Soft, nontender, obese.  Normoactive bowel sounds.  GU/RECTAL:  Exams are deferred.  EXTREMITIES:  Without significant clubbing, cyanosis or edema.  PULSES:  Her pulses are all 2+.  She has no femoral bruits.  NEUROLOGIC/MUSCULOSKELETAL:  Without significant abnormality.   Her chest x-ray is currently pending.  Her echocardiogram shows normal left  ventricular systolic function.  No significant valvular heart disease, but  she has a wall motion abnormality on her anterior septum from the mid  ventricle to the apex which is concerning.  Electrocardiogram shows sinus  rhythm at a rate of 85 with a normal axis, normal intervals, nonspecific ST-  T wave changes with poor R wave progression.   LABORATORY DATA:  White blood cell count 5.1, H&H of 12 and 38.  Sodium 128,  potassium 3.1. Chloride 96.  Bicarbonate of 26.  BUN 8.  Creatinine 0.7, and  her blood sugar is 109.  A single set of cardiac enzymes is not consistent  with acute myocardial infarction.   ASSESSMENT:   This is a woman with ongoing chest discomfort which is  intermittent with an abnormal wall motion on her echocardiogram. At this  point, with her substantial family history of coronary artery disease and  wall motion abnormality with the ongoing chest pain,  I think it is probably  reasonable to assume that she has a significant unstable angina, and treat  as such.  We are going to add heparin to her medical therapy.  I am going to  add aspirin her to her medical therapy, and I am going to transport her to  [facility] for heart catheterization today.     Merry Proud   JH/MEDQ  D:  11/17/2004  T:  11/17/2004  Job:  [id number]

## 2011-04-27 NOTE — Op Note (Signed)
[facility]  Patient:    Hannah Schultz                        MRN: [id number] Proc. Date: 09/09/00 Attending:  Lew Dawes. Rosana Hoes, M.D.                           Operative Report  PREOPERATIVE DIAGNOSIS:  Chronic cholecystolithiasis.  POSTOPERATIVE DIAGNOSIS:  Chronic cholecystolithiasis.  OPERATION:  Laparoscopic cholecystectomy.  SURGEON:  Timothy E. Rosana Hoes, M.D.  ASSISTANT:  Edsel Petrin. Dalbert Batman, M.D.  ANESTHESIA:  CRNA supervised by M.D.  INDICATIONS:  Hannah Schultz is 32, grossly obese, element of hypertension, significant gastroesophageal reflux with acute and chronic cholecystolithiasis.  After careful consideration of all the issues, the patient wishes to proceed with a laparoscopic cholecystectomy.  Her laboratory data, ECG were otherwise negative.  She has been treated for reflux and she is on a weight losing regimen.  DESCRIPTION OF PROCEDURE:  The patient was brought to the operating room and placed supine.  General endotracheal anesthesia administered.  The abdomen was scrubbed, prepped and draped in the usual fashion.  The keloid scar at the infraumbilical rim was removed.  The midline fascia was identified, opened vertically and the peritoneum entered without complication.  The Hasson catheter placed, tied in place and the abdomen insufflated.  Peritoneoscopy revealed a chronically inflamed gallbladder.  The remainder of the upper abdomen appeared satisfactory.  In the lower abdomen there were adhesions of the omentum and small bowel to the lower midline.  We did not attempt to dissect that area.  Attention was turned to the gallbladder, second 10 mm trocar placed in midepigastrium, and two 5 mm trocars in the right upper quadrant.  Each puncture site had been injected with 0.25% Marcaine with epinephrine. Then under direct vision the gallbladder was grasped, placed on tension, careful dissection at the base of the gallbladder revealed a  spiraled otherwise normal cystic duct.  This was carefully dissected out until it was relatively straight.  Behind that was a cystic artery.  The cystic duct was then triply clipped and divided and the cystic artery was dissected out of the peritoneum up to where it entered the gallbladder where it was triply clipped and divided.  The gallbladder was then removed from the gallbladder bed without complication or incident.  The gallbladder bed was dry.  The clips were all intact.  Irrigant was clear.  The gallbladder was removed through the infraumbilical incision after removal of multiple stones percutaneously through the gallbladder in the umbilicus.  That incision was then closed with #1 Vicryl.  Irrigation was carried out and was clear.  All irrigant, CO2, instruments and trocars removed under direct vision.  The skin incisions were then closed with interrupted 4-0 Monocryl.  Steri-Strips carefully applied. She tolerated it well.  Counts were correct.  She was then awakened and taken to the recovery room in good condition. DD:  09/09/00 TD:  09/10/00 Job: [id number]

## 2011-04-27 NOTE — Discharge Summary (Signed)
Hannah Schultz, Hannah Schultz              ACCOUNT NO.:  [id number]   MEDICAL RECORD NO.:  [id number]          PATIENT TYPE:  INP   LOCATION:  3709                         FACILITY:  [facility]   PHYSICIAN:  Edward L. Luan Pulling, M.D.DATE OF BIRTH:  November 29, 1961   DATE OF ADMISSION:  11/17/2004  DATE OF DISCHARGE:  12/10/2005LH                                 DISCHARGE SUMMARY   FINAL DISCHARGE DIAGNOSES:  1.  Chest pain without definite myocardial infarction.  2.  Panic attacks.  3.  Hypertension.  4.  Wall motion abnormality on echocardiogram.  5.  Hypokalemia.  6.  Hyponatremia.   HISTORY:  [name] is a 50 year old who was in her usual state of fairly  good health with a history of hypertension and panic attacks who came to the  emergency room because of a several day history of chest discomfort.  The  chest discomfort was tightness in her chest.  The tightness was a sensation  that it was pressure on her chest.  She came to the emergency room where she  had an electrocardiogram that did not show any acute changes, and initial  cardiac markers were negative.   PHYSICAL EXAMINATION:  GENERAL:  Obese female who was in no acute distress.  CHEST:  Clear.  HEART:  Regular without gallop.  ABDOMEN:  Soft, no masses were felt.  EXTREMITIES:  No edema.   LABORATORY DATA:  Sodium and potassium were both decreased.   HOSPITAL COURSE:  She had serial cardiac enzymes which were negative for  definite evidence of myocardial infarction.  Serial EKG's also negative for  definite myocardial infarction.  Consultation was obtained with [facility]  Cardiology team and an echocardiogram was ordered.  She was seen on the  echocardiogram to have a wall motion abnormality, and because of that, she  was transferred to [facility] for cardiac catheterization.  Followup will be depending on the results of her cardiac catheterization.     Edwa   ELH/MEDQ  D:  11/19/2004  T:  11/19/2004  Job:  [id number]

## 2011-04-27 NOTE — Discharge Summary (Signed)
[place]  Patient:    Hannah Schultz, Hannah Schultz                     MRN: [id number] Adm. Date:  [id number] Disc. Date: [id number] Attending:  Allene Dillon Dictator:   Tad Moore, P.A. CC:         Sinda Du, M.D., [address]  [place], [place] [address]  Allene Dillon, M.D. [facility]   Discharge Summary  DISCHARGE DIAGNOSIS:  Chest pain, status post negative cardiac exam.  HOSPITAL COURSE:  The patient was transferred from [facility] to [place]. [facility] for evaluation of substernal chest pain.  She denied any radiation or syncope.  She did report associated nausea, shortness of breath, and diaphoresis.  She also reported that she had noticed increased fatigue and dyspnea on exertion over the previous week.  She was seen and admitted by Allene Dillon, M.D.  He felt that her symptoms were somewhat worrisome for unstable angina, however, they were somewhat atypical. His plan was to follow serial cardiac enzymes and schedule the patient for GXT Cardiolite the following day if they were negative.  The following day, the patient underwent a stress Cardiolite exam.  Nuclear imaging revealed no evidence of ischemia or scarring.  The ejection fraction was greater than 70%.  With these results in mind, Denice Bors. Stanford Breed, M.D., felt she was stable for discharge.  DISCHARGE MEDICATIONS:  Vioxx 25 mg q.d.  ACTIVITY:  The patient is advised to return to her normal level of activity.  DIET:  She is to follow a low-fat diet.  FOLLOW-UP:  She is to follow up with Sinda Du, M.D., as needed or scheduled.  LABORATORY VALUES:  Sodium 136, potassium 4.2, chloride 98, CO2 28, BUN 11, creatinine 0.7, glucose 113.  White count 3.5, hemoglobin 13.4, hematocrit 40.2, platelets 349.  Serial cardiac enzymes were negative for MI. Total cholesterol 188, triglycerides 153, HDL 47, LDL 110, total cholesterol to HDL ratio 4.0.  The  TSH was 0.857.  The electrocardiogram revealed sinus rhythm at approximately 78 with sinus arrhythmia noted.  The PR interval was 0.156, QRS 0.074, QTC 0.405, and axis 216. DD:  03/01/01 TD:  03/03/01 Job: [id number]

## 2011-04-27 NOTE — Procedures (Signed)
[name], [name]              ACCOUNT NO.:  [id number]   MEDICAL RECORD NO.:  [id number]          PATIENT TYPE:  INP   LOCATION:  3709                         FACILITY:  [facility]   PHYSICIAN:  Scarlett Presto, M.D.   DATE OF BIRTH:  16-Sep-1961   DATE OF PROCEDURE:  11/17/2004  DATE OF DISCHARGE:                                  ECHOCARDIOGRAM   PRIMARY CARE PHYSICIAN:  Dr. Luan Pulling.   TAPE NUMBER:  [id number].   TAPE COUNT:  [id number] through 3675.   HISTORY:  This is a 50 year old woman in the intensive care unit with  ongoing chest discomfort.  Technical quality of this study is severely  limited.  M-mode tracings are inaccurate.  The evaluation is really limited  just to the left ventricle, and there appears to be normal systolic function  with left ventricular hypertrophy.  There is a wall motion abnormality that  can be seen in certain views, specifically, not so well in the parasternal  lung and parasternal short axis views, but a little bit better in the apical  views.  There appears to be some evidence of some hypokinesis in the  anterior septum from mid ventricle to the apex which is concerning for  coronary artery disease.  Otherwise, this study is quite limited.     Merry Proud   JH/MEDQ  D:  11/17/2004  T:  11/18/2004  Job:  [id number]

## 2011-04-27 NOTE — Discharge Summary (Signed)
NAMEGREIDYS, DELAND NO.:  [id number]   MEDICAL RECORD NO.:  [id number]          PATIENT TYPE:  INP   LOCATION:  5003                         FACILITY:  [facility]   PHYSICIAN:  Dola Argyle, M.D.     DATE OF BIRTH:  Apr 28, 1961   DATE OF ADMISSION:  02/20/2006  DATE OF DISCHARGE:  02/21/2006                           DISCHARGE SUMMARY - REFERRING   DISCHARGE DIAGNOSES:  1.  Prolonged atypical chest discomfort of uncertain etiology, probably      musculoskeletal.  2.  History of hypertension.  3.  Panic attacks.  4.  Elevated hemoglobin A1c without specific diagnosis of diabetes.  5.  Obesity.   SUMMARY OF HISTORY:  Ms. Nix is a 50 year old African American female who  was transferred from [facility] to [place]. [facility]  for further evaluation of chest discomfort.   Ms. Erich describes a left-sided sharp chest discomfort radiating through  to her back for the preceding two weeks.  Usually, it is a 1 to 2 on a scale  of 0 to 10 and would last up to three hours, then go away for about 30  minutes but then come right back.  She denies any associated shortness of  breath, nausea or vomiting, diaphoresis, coughing, wheezing or pleuritic  component to her chest discomfort.  She states it has not changed with  aspirin, Zantac, movement, exertion nor does she have any tenderness.  She  states that the last time it was a zero was approximately two days prior to  admission.  She scheduled an appointment with her primary care physician  this coming Friday secondary to her symptoms.  However, today her discomfort  was worse.  She gave it a 5 on a scale of 0 to 10.  This became worse at  approximately 11 a.m. 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  She went to the medical center at work, they summoned EMS  who transported her to [facility] ER.  She states her discomfort is  different from her hospitalization  in December 2005.   ALLERGIES:  NO KNOWN DRUG ALLERGIES.   MEDICATIONS PRIOR TO ADMISSION:  1.  Mavik 4 mg daily.  2.  Zantac unknown dosage on a p.r.n. basis.  3.  Aspirin 81 daily.   PAST MEDICAL HISTORY:  1.  Hypertension.  2.  Panic attacks.  3.  Hyperlipidemia, unknown last check.  4.  Obesity.   During her admission in December 2005, a cardiac catheterization showed EF  of 65% and a 30% distal LAD.  A D-dimer at that time was elevated but a  chest CT did not show any evidence of pulmonary embolus.  Echocardiogram  prior to her heart catheterization showed possible anterior hypokinesis.  She also has a history of a stress Myoview in March 2002, this showed an EF  of 72%, no ischemia.   PAST SURGICAL HISTORY:  1.  Status post laparoscopic cholecystectomy.  2.  BTL.  3.  Hysterectomy.   LABORATORY DATA:  Chest x-ray on admission did not  show any active disease.   Admission H&H was 12.4 and 37.6, normal indices, platelets 252, wbc 3.5.  PTT 29, PT 13.7.  Sodium 138, potassium 3.7, BUN 8, creatinine 0.7, glucose  113, normal LFTs.  Hemoglobin A1c was elevated at 7.6.  CK-MB and troponins  were negative for myocardial infarction x3.  TSH was normal at 0.96.   EKGs shows normal sinus rhythm, baseline artifact, left axis deviation,  nonspecific changes.   HOSPITAL COURSE:  Ms. Urquiza was admitted to [facility]  overnight for observation. It was felt that if her enzymes were negative for  myocardial infarction, she could be discharged home with outpatient  evaluation by her primary care physician, since her discomfort was atypical  and prior cardiac work-up was essentially unremarkable.  Overnight, she did  not have any further discomfort.  Enzymes and EKGs were negative for  myocardial infarction.  After review, Dr. Ron Parker felt that she could be  discharged home with follow-up of her primary care physician.   DISPOSITION:  Ms. Hable is discharged home.   Asked to maintain a low salt,  fat and cholesterol diet.  Activities are not restricted.  She was asked to  begin a blood pressure diary and to bring all medications and her blood  pressures to Dr. Luan Pulling for review.  She was asked to continue on Mavik 4  mg p.o. daily, aspirin 325 daily, and Zantac as previously.  She was asked  to keep her appointment with Dr. Luan Pulling on Friday.  Dr. Luan Pulling will assess  for the possible diagnosis of diabetes given her hemoglobin A1c of 7.6.      Sharyl Nimrod, P.A. [facility]    ______________________________  Dola Argyle, M.D.    EW/MEDQ  D:  02/21/2006  T:  02/22/2006  Job:  [id number]   cc:   Percell Miller L. Luan Pulling, M.D.  Fax: [phone]   Scarlett Presto, M.D.  Fax: [phone]

## 2011-04-27 NOTE — H&P (Signed)
Hannah Schultz, Hannah Schultz NO.:  [id number]   MEDICAL RECORD NO.:  [id number]          PATIENT TYPE:  INP   LOCATION:  7425                         FACILITY:  [facility]   PHYSICIAN:  Kirk Ruths, M.D. LHCDATE OF BIRTH:  [birth year]   DATE OF ADMISSION:  02/20/2006  DATE OF DISCHARGE:                                HISTORY & PHYSICAL   BRIEF HISTORY:  Hannah Schultz is a  50 year old African-American female who  was transferred from [facility] to [facility] for further  evaluation of her chest discomfort.   She describes a 2-week history of left-sided chest sharp pain radiating  straight through to her back. She states that they rate 1 to 2 on a scale of  0/10 and last up to 3 hours. They may go away for approximately 30 minutes  but then come right back. She denies any associated shortness of breath,  nausea, vomiting, coughing, wheezing pleuritic component to her discomfort.  It does not change with Zantac. She denies any tenderness with movement,  changes with aspirin, recent injuries or accidents. She feels that this is  different from her discomfort from December 2005. She states she presented  today because it was worse at approximately 11:00 a.m. 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 She stated that her discomfort became much worse, a 25 on  a scale of zero to 10. She went to the [facility] at work who summoned  EMS and ultimately transferred her to [facility]. There they called her  primary care physician and were told to transfer her to [facility].   Hannah Schultz states that she had scheduled an appointment with her primary care  physician for Friday in regards to the above. She states that in the  emergency room she received sublingual nitroglycerin which might have eased  it off slightly, though not much.  She states now she has a headache. She  states the last time her discomfort was a zero was over 2 days ago.   PAST MEDICAL HISTORY:  No known drug  allergies.   MEDICATIONS PRIOR TO ADMISSION:  1.  Mavik 4 mg p.o. daily  2.  Zantac p.r.n..  3.  Aspirin 81 mg daily.   She has a history of:  1.  Hypertension.  2.  Panic attacks.  3.  Hyperlipidemia, unknown last check.  4.  Obesity.  5.  She was hospitalized in December 2005 for chest discomfort. D-dimer was      elevated at that time; however, chest CT was negative for pulmonary      embolism. An echocardiogram in December 2005 at [facility]      showed possible anterior hypokinesis. She was sent to [facility] for cardiac      catheterization.  This was performed and showed EF of 65%, a distal 30%      LAD lesion. Other cardiac testing was in March 2002 when she had a      stress Cardiolite that showed EF of 72%, no ischemia.  6.  Her surgical history is notable for laparoscopic cholecystectomy,  bilateral tubal ligation, status post hysterectomy.   She denies any history of cancer, myocardial infarction, CVA, COPD,  diabetes, bleeding dyscrasias, or thyroid dysfunction.   SOCIAL HISTORY:  She resides in [place] with her husband. 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 She has two children alive  and well. She denies any history of tobacco, alcohol or drug use or herbal  medication use. She does not follow a specific diet. She occasionally does  sit ups and occasionally walks but does not exercise on a regular basis.   She stated that her mother died in her 58s with a history of stroke and  myocardial infarction. Her mother also had diabetes. Father died in his 79s  after two myocardial infarctions and also had leukemia.  She has one sister  who has had a myocardial infarction in her 75s and a brother who died at the  age of 52 of sudden death.   REVIEW OF SYSTEMS:  In addition to the above, noted for occasional headache,  glasses, 1-year history of dyspnea on exertion which has not changed,  postmenopausal.   PHYSICAL EXAMINATION:   GENERAL:  Well-nourished, well-developed, pleasant  white a pleasant African-American female in no apparent distress.  VITAL SIGNS: Temperature is 98.2, blood pressure 127/80, pulse 75,  respirations 20, 100% saturation on room air.  HEENT: Is unremarkable except for glasses.  NECK: Supple without thyromegaly, adenopathy, JVD or carotid bruits.  HEART:  PMI is not displaced. Regular rate and rhythm. Cannot appreciate any  murmurs, rubs, clicks or gallops. All pulses are symmetrical and intact.  CHEST: Lungs clear to auscultation bilaterally.  SKIN:  Integument appears to be intact.  ABDOMEN:  Obese. Bowel sounds present without organomegaly, masses or  tenderness. She is obese.  EXTREMITIES: No cyanosis, clubbing or edema. Peripheral pulses are  symmetrical and intact.  MUSCULOSKELETAL:  She does not have any apparent joint deformities or  effusions. However, her discomfort is reproducible with palpation to the  left chest wall and the left posterior rib cage. Her discomfort is also  reproducible with upper extremity range of motion exercises.  NEUROLOGIC: Grossly intact.   Chest x-ray at [facility] shows no active disease.   EKG from [facility] shows normal sinus rhythm, left axis deviation, normal  intervals baseline artifact nonspecific ST-T wave changes.   Hemoglobin was 12.4, hematocrit 37.6, normal indices, platelets 252, WBC  3.5. Point care markers were negative x2.   She has a history of hypertension, hyperlipidemia, and panic attacks,  nonobstructive coronary artery disease on catheterization in December 2005   PLAN:  Dr. Stanford Breed reviewed the patient's history, spoke with and examined  the patient, and agrees with the above. He agrees that her chest discomfort  is most compatible with musculoskeletal discomfort. We will treat her  with his nonsteroidal anti-inflammatory agents, rule out myocardial infarction, and continue her home medications. If her enzymes are  negative,  anticipation of discharge in the morning without further cardiac workup and  to follow up with her primary care physician.      Sharyl Nimrod, P.A. [facility]    ______________________________  Kirk Ruths, M.D. [facility]    EW/MEDQ  D:  02/20/2006  T:  02/20/2006  Job:  [id number]   cc:   Percell Miller L. Luan Pulling, M.D.  Fax: [phone]   Scarlett Presto, M.D.  Fax: [phone]

## 2011-04-27 NOTE — H&P (Signed)
[name], [name]              ACCOUNT NO.:  [id number]   MEDICAL RECORD NO.:  [id number]          PATIENT TYPE:  INP   LOCATION:  IC07                          FACILITY:  [facility]   PHYSICIAN:  Edward L. Juanetta Gosling, M.D.DATE OF BIRTH:  [birth year]   DATE OF ADMISSION:  11/16/2004  DATE OF DISCHARGE:  LH                                HISTORY & PHYSICAL   Hannah Schultz is a 50 year old who was in her usual state of fairly good health  when she developed substernal chest pain about three days ago.  She had some  shortness of breath associated with it.  The pain initially was on somewhat  to the right of the sternum, then moved to the left of the sternum.  She did  not get diarrhea.  She did not get any nausea.  She did not get any  vomiting.  She has just not felt well.  She had eventually gotten to the  point where she came to the emergency room.  She was evaluated in the  emergency room and admitted.  She continues to have chest pain now.  She was  admitted about 1:00 a.m. this morning.   PAST MEDICAL HISTORY:  1.  History of panic attacks, but this feels very different.  2.  She has a history of hypertension and has been on Mavik.   There is a very positive family history of heart disease in that her mother  had sudden cardiac death, her father died in his 82s of cardiac disease.  She has a sister who is in her 40s who recently had a myocardial infarction.   SOCIAL HISTORY:  She works for the [employer].  She is a nonsmoker.  She does not drink any alcohol.   REVIEW OF SYSTEMS:  Except as mentioned, negative.  She is not really having  any other complaints.  She did receive Ativan in the emergency room.   PHYSICAL EXAMINATION:  GENERAL:  Exam now shows a well-developed, well-  nourished female who is in no acute distress.  She still has chest  discomfort, however.  VITAL SIGNS:  Pulse 69; respirations 16; blood pressure 105/63; O2 sat is  93%.  HEENT:   Pupils are reactive.  Nose and throat are clear.  NECK:  Supple without masses.  CHEST:  Fairly clear with decreased breath sounds.  HEART:  Regular without gallop.  ABDOMEN:  Soft.  No masses are felt.  EXTREMITIES:  No edema.  CNS:  Grossly intact.   Electrocardiogram really does not show any ischemia.  Her lab work so far is  negative, but her potassium is 3.1, sodium is down to 129.   ASSESSMENT:  Since she is still having chest discomfort, despite the fact  that she has had a previous Cardiolite stress test about three years ago, I  do not think we can make the assumption that this is not cardiac, so I am  going to go ahead and start her on nitroglycerin infusion, I have asked for  [facility] consultation, give her some oxygen and then re-evaluate.  Edwa   ELH/MEDQ  D:  11/17/2004  T:  11/17/2004  Job:  [id number]

## 2011-05-24 ENCOUNTER — Ambulatory Visit: Payer: Self-pay | Admitting: Orthopedic Surgery

## 2011-05-29 ENCOUNTER — Other Ambulatory Visit (HOSPITAL_COMMUNITY): Payer: Self-pay | Admitting: Pulmonary Disease

## 2011-05-29 DIAGNOSIS — Z139 Encounter for screening, unspecified: Secondary | ICD-10-CM

## 2011-07-09 ENCOUNTER — Ambulatory Visit (HOSPITAL_COMMUNITY)
Admission: RE | Admit: 2011-07-09 | Discharge: 2011-07-09 | Disposition: A | Discharge status: Home / Self Care | Payer: Medicare Other | Source: Ambulatory Visit | Attending: Pulmonary Disease | Admitting: Pulmonary Disease

## 2011-07-09 DIAGNOSIS — Z139 Encounter for screening, unspecified: Secondary | ICD-10-CM

## 2011-07-09 DIAGNOSIS — Z1231 Encounter for screening mammogram for malignant neoplasm of breast: Secondary | ICD-10-CM | POA: Insufficient documentation

## 2011-08-10 ENCOUNTER — Other Ambulatory Visit (HOSPITAL_COMMUNITY): Payer: Self-pay | Admitting: "Endocrinology

## 2011-08-10 ENCOUNTER — Other Ambulatory Visit (HOSPITAL_COMMUNITY): Payer: Self-pay | Admitting: Internal Medicine

## 2011-08-10 DIAGNOSIS — E049 Nontoxic goiter, unspecified: Secondary | ICD-10-CM

## 2011-08-15 ENCOUNTER — Ambulatory Visit (HOSPITAL_COMMUNITY): Payer: Medicare HMO

## 2011-09-25 LAB — COMPREHENSIVE METABOLIC PANEL
Albumin: 3.6
Alkaline Phosphatase: 73
BUN: 12
Creatinine, Ser: 0.6
Potassium: 4.4
Total Protein: 6.7

## 2011-09-25 LAB — CBC
Hemoglobin: 12.7
RDW: 14.3 — ABNORMAL HIGH

## 2011-09-25 LAB — TYPE AND SCREEN: Antibody Screen: NEGATIVE

## 2011-09-30 ENCOUNTER — Emergency Department (HOSPITAL_COMMUNITY)
Admission: EM | Admit: 2011-09-30 | Discharge: 2011-09-30 | Disposition: A | Discharge status: Home / Self Care | Payer: Medicare HMO | Attending: Emergency Medicine | Admitting: Emergency Medicine

## 2011-09-30 DIAGNOSIS — I10 Essential (primary) hypertension: Secondary | ICD-10-CM | POA: Insufficient documentation

## 2011-09-30 DIAGNOSIS — F329 Major depressive disorder, single episode, unspecified: Secondary | ICD-10-CM | POA: Insufficient documentation

## 2011-09-30 DIAGNOSIS — B9789 Other viral agents as the cause of diseases classified elsewhere: Secondary | ICD-10-CM | POA: Insufficient documentation

## 2011-09-30 DIAGNOSIS — E119 Type 2 diabetes mellitus without complications: Secondary | ICD-10-CM | POA: Insufficient documentation

## 2011-09-30 DIAGNOSIS — F3289 Other specified depressive episodes: Secondary | ICD-10-CM | POA: Insufficient documentation

## 2011-09-30 DIAGNOSIS — N898 Other specified noninflammatory disorders of vagina: Secondary | ICD-10-CM

## 2011-09-30 DIAGNOSIS — K219 Gastro-esophageal reflux disease without esophagitis: Secondary | ICD-10-CM | POA: Insufficient documentation

## 2011-09-30 DIAGNOSIS — F4310 Post-traumatic stress disorder, unspecified: Secondary | ICD-10-CM | POA: Insufficient documentation

## 2011-09-30 DIAGNOSIS — Z794 Long term (current) use of insulin: Secondary | ICD-10-CM | POA: Insufficient documentation

## 2011-09-30 HISTORY — DX: Gastro-esophageal reflux disease without esophagitis: K21.9

## 2011-09-30 HISTORY — DX: Type 2 diabetes mellitus with diabetic neuropathy, unspecified: E11.40

## 2011-09-30 HISTORY — DX: Depression, unspecified: F32.A

## 2011-09-30 HISTORY — DX: Major depressive disorder, single episode, unspecified: F32.9

## 2011-09-30 HISTORY — DX: Post-traumatic stress disorder, unspecified: F43.10

## 2011-09-30 HISTORY — DX: Essential (primary) hypertension: I10

## 2011-09-30 MED ORDER — FLUCONAZOLE 100 MG PO TABS
150.0000 mg | ORAL_TABLET | Freq: Once | ORAL | Status: AC
  Administered 2011-09-30: 150 mg via ORAL
  Filled 2011-09-30: qty 2

## 2011-09-30 NOTE — ED Notes (Signed)
Pt seen and assessed by physician.

## 2011-09-30 NOTE — ED Notes (Signed)
"  scratchy throat " since Wednesday, started coughing tonight, unable to sleep, feels like throat is closing up

## 2011-09-30 NOTE — ED Provider Notes (Signed)
History     CSN: [id number] Arrival date & time: 09/30/2011  4:23 AM   First MD Initiated Contact with Patient 09/30/11 0434      Chief Complaint  Patient presents with  . Sore Throat    (Consider location/radiation/quality/duration/timing/severity/associated sxs/prior treatment) HPI Comments: Seen 2426. Patient with scratchy throat since Wednesday. Has been treating sore throat with OTC cold medications. Tonight developed cough. Between sore throat and cough, unable to sleep. Has lost her voice. Denies fever, headache, nausea, vomiting. Patient has a h/o diabetes under the care of Dr. Dorris Fetch who is working to get better control of sugars. Patient sugars have in in 200-300's. She has noticed vaginal  itching and discharge for several days. Single partner.   Patient is a 50 y.o. female presenting with pharyngitis. The history is provided by the patient.  Sore Throat This is a new (scratchy throat began Wednesday, cough yesterday.) problem. The current episode started more than 2 days ago. The problem has not changed since onset.Pertinent negatives include no chest pain, no abdominal pain, no headaches and no shortness of breath. Exacerbated by: lying down. The symptoms are relieved by nothing. Treatments tried: OTC cold medicines. The treatment provided no relief.    Past Medical History  Diagnosis Date  . Hypertension   . Diabetes mellitus   . Diabetic neuropathy   . Depression   . Post traumatic stress disorder (PTSD)   . Acid reflux     Past Surgical History  Procedure Date  . Abdominal hysterectomy   . Cholecystectomy     No family history on file.  History  Substance Use Topics  . Smoking status: Never Smoker   . Smokeless tobacco: Not on file  . Alcohol Use: No    OB History    Grav Para Term Preterm Abortions TAB SAB Ect Mult Living                  Review of Systems  HENT: Positive for sore throat.   Respiratory: Positive for cough. Negative for shortness  of breath and wheezing.   Cardiovascular: Negative for chest pain.  Gastrointestinal: Negative for abdominal pain.  Genitourinary: Positive for vaginal discharge.  Neurological: Negative for headaches.    Allergies  Review of patient's allergies indicates no known allergies.  Home Medications   Current Outpatient Rx  Name Route Sig Dispense Refill  . CITALOPRAM HYDROBROMIDE 40 MG PO TABS Oral Take 40 mg by mouth daily.      Marland Kitchen CLONAZEPAM 0.5 MG PO TABS Oral Take 0.5 mg by mouth 3 (three) times daily as needed.      Marland Kitchen GABAPENTIN 300 MG PO CAPS Oral Take 300 mg by mouth 2 (two) times daily.      . INSULIN ASPART 100 UNIT/ML [place] SOLN Subcutaneous Inject into the skin 3 (three) times daily before meals. Sliding scale only when bs up     . INSULIN DETEMIR 100 UNIT/ML [place] SOLN Subcutaneous Inject 80 Units into the skin at bedtime.      . NEBIVOLOL HCL 5 MG PO TABS Oral Take 5 mg by mouth daily.      Marland Kitchen RANITIDINE HCL 150 MG PO TABS Oral Take 150 mg by mouth 2 (two) times daily.      . TRAZODONE HCL 300 MG PO TABS Oral Take 300 mg by mouth at bedtime.      Marland Kitchen VITAMIN B-12 500 MCG PO TABS Oral Take 500 mcg by mouth daily.  BP 142/77  Pulse 82  Temp(Src) 98.4 F (36.9 C) (Oral)  Resp 22  Ht $R'5\' 5"'kX$  (1.651 m)  Wt 255 lb (115.667 kg)  BMI 42.43 kg/m2  SpO2 98%  Physical Exam  Nursing note and vitals reviewed. Constitutional: She is oriented to person, place, and time. She appears well-developed and well-nourished.  HENT:  Head: Normocephalic and atraumatic.  Right Ear: External ear normal.  Left Ear: External ear normal.  Nose: Nose normal.  Mouth/Throat: Oropharynx is clear and moist. No oropharyngeal exudate.  Eyes: EOM are normal.  Neck: Normal range of motion. Neck supple. Tracheal deviation present. No thyromegaly present.  Cardiovascular: Normal rate, normal heart sounds and intact distal pulses.   Pulmonary/Chest: Effort normal and breath sounds normal. No respiratory  distress. She has no wheezes. She has no rales. She exhibits no tenderness.  Abdominal: Soft. Bowel sounds are normal.  Genitourinary:       Scant vaginal discharge  Musculoskeletal: Normal range of motion.  Lymphadenopathy:    She has no cervical adenopathy.  Neurological: She is alert and oriented to person, place, and time. She has normal reflexes.  Skin: Skin is warm and dry.    ED Course  Procedures (including critical care time)  Labs Reviewed - No data to display No results found.   No diagnosis found.    MDM  Patient with cold symptoms x 3 days. Sore throat, cough, congestion, laryngitis. Also with vaginal discharge in the setting of elevated glucose readings. Reviewed symptomatic treatment for viral illnesses. Provided diflucan for yeast infection.The patient appears reasonably screened and/or stabilized for discharge and I doubt any other medical condition or other [facility] requiring further screening, evaluation, or treatment in the ED at this time prior to discharge. MDM Reviewed: nursing note and vitals           Hannah Schultz. Olin Hauser, MD 09/30/11 3545

## 2012-01-10 ENCOUNTER — Ambulatory Visit (INDEPENDENT_AMBULATORY_CARE_PROVIDER_SITE_OTHER): Payer: Medicare HMO | Admitting: Otolaryngology

## 2012-01-10 DIAGNOSIS — J351 Hypertrophy of tonsils: Secondary | ICD-10-CM

## 2012-01-10 DIAGNOSIS — J310 Chronic rhinitis: Secondary | ICD-10-CM

## 2012-01-10 DIAGNOSIS — M950 Acquired deformity of nose: Secondary | ICD-10-CM

## 2012-03-06 ENCOUNTER — Ambulatory Visit (INDEPENDENT_AMBULATORY_CARE_PROVIDER_SITE_OTHER): Payer: Medicare HMO | Admitting: Otolaryngology

## 2012-03-06 DIAGNOSIS — J351 Hypertrophy of tonsils: Secondary | ICD-10-CM

## 2012-03-06 DIAGNOSIS — J3501 Chronic tonsillitis: Secondary | ICD-10-CM

## 2012-03-19 ENCOUNTER — Other Ambulatory Visit: Payer: Self-pay | Admitting: Otolaryngology

## 2012-03-25 ENCOUNTER — Encounter (HOSPITAL_COMMUNITY): Payer: Self-pay | Admitting: Pharmacy Technician

## 2012-03-26 ENCOUNTER — Encounter (HOSPITAL_COMMUNITY)
Admission: RE | Admit: 2012-03-26 | Discharge: 2012-03-26 | Disposition: A | Discharge status: Home / Self Care | Payer: Medicare HMO | Source: Ambulatory Visit | Attending: Anesthesiology | Admitting: Anesthesiology

## 2012-03-26 ENCOUNTER — Encounter (HOSPITAL_COMMUNITY)
Admission: RE | Admit: 2012-03-26 | Discharge: 2012-03-26 | Disposition: A | Discharge status: Home / Self Care | Payer: Medicare HMO | Source: Ambulatory Visit | Attending: Otolaryngology | Admitting: Otolaryngology

## 2012-03-26 ENCOUNTER — Encounter (HOSPITAL_COMMUNITY): Payer: Self-pay

## 2012-03-26 HISTORY — DX: Sleep apnea, unspecified: G47.30

## 2012-03-26 LAB — BASIC METABOLIC PANEL
BUN: 12 mg/dL (ref 6–23)
Chloride: 99 mEq/L (ref 96–112)
Creatinine, Ser: 0.83 mg/dL (ref 0.50–1.10)
GFR calc Af Amer: >90 mL/min (ref >90)
GFR calc non Af Amer: 82 mL/min — ABNORMAL LOW (ref >90)
Glucose, Bld: 250 mg/dL — ABNORMAL HIGH (ref 70–99)

## 2012-03-26 LAB — SURGICAL PCR SCREEN: MRSA, PCR: NEGATIVE

## 2012-03-26 LAB — CBC
HCT: 39.2 % (ref 36.0–46.0)
Hemoglobin: 13.1 g/dL (ref 12.0–15.0)
MCH: 26.5 pg (ref 26.0–34.0)
MCHC: 33.4 g/dL (ref 30.0–36.0)
RDW: 13.7 % (ref 11.5–15.5)

## 2012-03-26 NOTE — Pre-Procedure Instructions (Signed)
[place]  03/26/2012   Your procedure is scheduled on:  April 02, 2012  Report to [facility] at 8:00 AM.  Call this number if you have problems the morning of surgery: [phone]   Remember:   Do not eat food:After Midnight.  May have clear liquids: up to 4 Hours before arrival.  Clear liquids include soda, tea, black coffee, apple or grape juice, broth.  Take these medicines the morning of surgery with A SIP OF WATER: KLONOPIN AS NEEDED, BYSTOLIC   Do not wear jewelry, make-up or nail polish.  Do not wear lotions, powders, or perfumes. You may wear deodorant.  Do not shave 48 hours prior to surgery.  Do not bring valuables to the hospital.  Contacts, dentures or bridgework may not be worn into surgery.  Leave suitcase in the car. After surgery it may be brought to your room.  For patients admitted to the hospital, checkout time is 11:00 AM the day of discharge.   Patients discharged the day of surgery will not be allowed to drive home.  Name and phone number of your driver: roland Favaro  Special Instructions: CHG Shower Use Special Wash: 1/2 bottle night before surgery and 1/2 bottle morning of surgery.   Please read over the following fact sheets that you were given: Pain Booklet, MRSA Information and Surgical Site Infection Prevention

## 2012-04-02 ENCOUNTER — Encounter (HOSPITAL_COMMUNITY)
Admission: RE | Disposition: A | Discharge status: Home / Self Care | Payer: Self-pay | Source: Ambulatory Visit | Attending: Otolaryngology

## 2012-04-02 ENCOUNTER — Ambulatory Visit (HOSPITAL_COMMUNITY)
Admission: RE | Admit: 2012-04-02 | Discharge: 2012-04-02 | Disposition: A | Discharge status: Home / Self Care | Payer: Medicare HMO | Source: Ambulatory Visit | Attending: Otolaryngology | Admitting: Otolaryngology

## 2012-04-02 ENCOUNTER — Encounter (HOSPITAL_COMMUNITY): Payer: Self-pay | Admitting: Anesthesiology

## 2012-04-02 ENCOUNTER — Encounter (HOSPITAL_COMMUNITY): Payer: Self-pay | Admitting: Surgery

## 2012-04-02 ENCOUNTER — Ambulatory Visit (HOSPITAL_COMMUNITY): Payer: Medicare HMO | Admitting: Anesthesiology

## 2012-04-02 DIAGNOSIS — Z794 Long term (current) use of insulin: Secondary | ICD-10-CM | POA: Insufficient documentation

## 2012-04-02 DIAGNOSIS — G4730 Sleep apnea, unspecified: Secondary | ICD-10-CM | POA: Insufficient documentation

## 2012-04-02 DIAGNOSIS — J312 Chronic pharyngitis: Secondary | ICD-10-CM | POA: Insufficient documentation

## 2012-04-02 DIAGNOSIS — J3501 Chronic tonsillitis: Secondary | ICD-10-CM | POA: Insufficient documentation

## 2012-04-02 DIAGNOSIS — Z9089 Acquired absence of other organs: Secondary | ICD-10-CM

## 2012-04-02 DIAGNOSIS — Z01812 Encounter for preprocedural laboratory examination: Secondary | ICD-10-CM | POA: Insufficient documentation

## 2012-04-02 DIAGNOSIS — J351 Hypertrophy of tonsils: Secondary | ICD-10-CM

## 2012-04-02 DIAGNOSIS — K219 Gastro-esophageal reflux disease without esophagitis: Secondary | ICD-10-CM | POA: Insufficient documentation

## 2012-04-02 DIAGNOSIS — E109 Type 1 diabetes mellitus without complications: Secondary | ICD-10-CM | POA: Insufficient documentation

## 2012-04-02 DIAGNOSIS — Z01810 Encounter for preprocedural cardiovascular examination: Secondary | ICD-10-CM | POA: Insufficient documentation

## 2012-04-02 DIAGNOSIS — Z01818 Encounter for other preprocedural examination: Secondary | ICD-10-CM | POA: Insufficient documentation

## 2012-04-02 DIAGNOSIS — I10 Essential (primary) hypertension: Secondary | ICD-10-CM | POA: Insufficient documentation

## 2012-04-02 HISTORY — PX: TONSILLECTOMY: SHX5217

## 2012-04-02 LAB — GLUCOSE, CAPILLARY
Glucose-Capillary: 253 mg/dL — ABNORMAL HIGH (ref 70–99)
Glucose-Capillary: 301 mg/dL — ABNORMAL HIGH (ref 70–99)

## 2012-04-02 SURGERY — TONSILLECTOMY
Anesthesia: General | Site: Mouth | Laterality: Bilateral | Wound class: Clean Contaminated

## 2012-04-02 MED ORDER — LACTATED RINGERS IV SOLN
INTRAVENOUS | Status: DC | PRN
  Administered 2012-04-02 (×2): via INTRAVENOUS

## 2012-04-02 MED ORDER — ONDANSETRON HCL 4 MG/2ML IJ SOLN
INTRAMUSCULAR | Status: DC | PRN
  Administered 2012-04-02: 4 mg via INTRAVENOUS

## 2012-04-02 MED ORDER — SODIUM CHLORIDE 0.9 % IR SOLN
Status: DC | PRN
  Administered 2012-04-02 (×2): 1000 mL

## 2012-04-02 MED ORDER — HYDROMORPHONE HCL PF 1 MG/ML IJ SOLN
0.2500 mg | INTRAMUSCULAR | Status: DC | PRN
  Administered 2012-04-02: 0.25 mg via INTRAVENOUS

## 2012-04-02 MED ORDER — LIDOCAINE HCL (CARDIAC) 20 MG/ML IV SOLN
INTRAVENOUS | Status: DC | PRN
  Administered 2012-04-02: 50 mg via INTRAVENOUS

## 2012-04-02 MED ORDER — ACETAMINOPHEN 10 MG/ML IV SOLN
INTRAVENOUS | Status: AC
  Filled 2012-04-02: qty 100

## 2012-04-02 MED ORDER — PROPOFOL 10 MG/ML IV EMUL
INTRAVENOUS | Status: DC | PRN
  Administered 2012-04-02: 200 mg via INTRAVENOUS
  Administered 2012-04-02 (×2): 50 mg via INTRAVENOUS

## 2012-04-02 MED ORDER — DEXAMETHASONE SODIUM PHOSPHATE 4 MG/ML IJ SOLN
INTRAMUSCULAR | Status: DC | PRN
  Administered 2012-04-02: 8 mg via INTRAVENOUS

## 2012-04-02 MED ORDER — SUCCINYLCHOLINE CHLORIDE 20 MG/ML IJ SOLN
INTRAMUSCULAR | Status: DC | PRN
  Administered 2012-04-02: 100 mg via INTRAVENOUS

## 2012-04-02 MED ORDER — LACTATED RINGERS IV SOLN
INTRAVENOUS | Status: DC
  Administered 2012-04-02: 09:00:00 via INTRAVENOUS

## 2012-04-02 MED ORDER — MIDAZOLAM HCL 5 MG/5ML IJ SOLN
INTRAMUSCULAR | Status: DC | PRN
  Administered 2012-04-02: 2 mg via INTRAVENOUS

## 2012-04-02 MED ORDER — FENTANYL CITRATE 0.05 MG/ML IJ SOLN
INTRAMUSCULAR | Status: DC | PRN
  Administered 2012-04-02: 150 ug via INTRAVENOUS
  Administered 2012-04-02: 100 ug via INTRAVENOUS

## 2012-04-02 MED ORDER — OXYMETAZOLINE HCL 0.05 % NA SOLN
NASAL | Status: DC | PRN
  Administered 2012-04-02: 1

## 2012-04-02 SURGICAL SUPPLY — 25 items
CANISTER SUCTION 2500CC (MISCELLANEOUS) ×3 IMPLANT
CATH ROBINSON RED A/P 10FR (CATHETERS) ×2 IMPLANT
CLOTH BEACON ORANGE TIMEOUT ST (SAFETY) ×3 IMPLANT
ELECT REM PT RETURN 9FT ADLT (ELECTROSURGICAL) ×3
ELECT REM PT RETURN 9FT PED (ELECTROSURGICAL)
ELECTRODE REM PT RETRN 9FT PED (ELECTROSURGICAL) IMPLANT
ELECTRODE REM PT RTRN 9FT ADLT (ELECTROSURGICAL) ×1 IMPLANT
GAUZE SPONGE 4X4 16PLY XRAY LF (GAUZE/BANDAGES/DRESSINGS) ×3 IMPLANT
GLOVE BIO SURGEON STRL SZ7.5 (GLOVE) ×3 IMPLANT
GLOVE SURG SS PI 6.5 STRL IVOR (GLOVE) ×2 IMPLANT
GOWN STRL NON-REIN LRG LVL3 (GOWN DISPOSABLE) ×6 IMPLANT
KIT BASIN OR (CUSTOM PROCEDURE TRAY) ×3 IMPLANT
KIT ROOM TURNOVER OR (KITS) ×3 IMPLANT
NS IRRIG 1000ML POUR BTL (IV SOLUTION) ×3 IMPLANT
PACK SURGICAL SETUP 50X90 (CUSTOM PROCEDURE TRAY) ×3 IMPLANT
PAD ARMBOARD 7.5X6 YLW CONV (MISCELLANEOUS) ×6 IMPLANT
SPECIMEN JAR SMALL (MISCELLANEOUS) ×4 IMPLANT
SPONGE TONSIL 1 RF SGL (DISPOSABLE) ×3 IMPLANT
SYR BULB 3OZ (MISCELLANEOUS) ×3 IMPLANT
TOWEL OR 17X24 6PK STRL BLUE (TOWEL DISPOSABLE) ×2 IMPLANT
TOWEL OR 17X26 10 PK STRL BLUE (TOWEL DISPOSABLE) ×2 IMPLANT
TUBE CONNECTING 12X1/4 (SUCTIONS) ×3 IMPLANT
TUBE SALEM SUMP 12R W/ARV (TUBING) IMPLANT
WAND COBLATOR 70 EVAC XTRA (SURGICAL WAND) ×3 IMPLANT
WATER STERILE IRR 1000ML POUR (IV SOLUTION) ×1 IMPLANT

## 2012-04-02 NOTE — Progress Notes (Signed)
Call to dr. Verita Lamb regarding blood sugar of 301.  No new orders received.

## 2012-04-02 NOTE — Op Note (Signed)
DATE OF PROCEDURE:  04/02/2012                              OPERATIVE REPORT  SURGEON:  Leta Baptist, MD  PREOPERATIVE DIAGNOSES: 1. Tonsillar hypertrophy. 2. Chronic tonsillitis and pharyngitis  POSTOPERATIVE DIAGNOSES: 1. Tonsillar hypertrophy. 2. Chronic tonsillitis and pharyngitis  PROCEDURE PERFORMED:  Adult tonsillectomy.  ANESTHESIA:  General endotracheal tube anesthesia.  COMPLICATIONS:  None.  ESTIMATED BLOOD LOSS:  Minimal.  INDICATION FOR PROCEDURE:  Hannah Schultz is a 51 y.o. female with a history of chronic tonsillitis/pharyngitis and halitosis.  According to the patient, She has been experiencing chronic throat discomfort with halitosis for several years. The patient continues to be symptomatic despite medical treatments. On examination, the patient was noted to have bilateral cryptic tonsils, with numerous tonsilloliths. Based on the above findings, the decision was made for the patient to undergo the tonsillectomy procedure. Likelihood of success in reducing symptoms was also discussed.  The risks, benefits, alternatives, and details of the procedure were discussed with the mother.  Questions were invited and answered.  Informed consent was obtained.  DESCRIPTION:  The patient was taken to the operating room and placed supine on the operating table.  General endotracheal tube anesthesia was administered by the anesthesiologist.  The patient was positioned and prepped and draped in a standard fashion for adenotonsillectomy.  A Crowe-Davis mouth gag was inserted into the oral cavity for exposure. 3+ cryptic tonsils were noted bilaterally.  No bifidity was noted.  Indirect mirror examination of the nasopharynx revealed no adenoid hypertrophy. Hemostasis was achieved with the Coblator device.  The right tonsil was then grasped with a straight Allis clamp and retracted medially.  It was resected free from the underlying pharyngeal constrictor muscles with the Coblator device.  The  same procedure was repeated on the left side without exception.  The surgical sites were copiously irrigated.  The mouth gag was removed.  The care of the patient was turned over to the anesthesiologist.  The patient was awakened from anesthesia without difficulty.  The patient was extubated and transferred to the recovery room in good condition.  OPERATIVE FINDINGS:  Tonsillar hypertrophy.  SPECIMEN:  Bilateral tonsils  FOLLOWUP CARE:  The patient will be discharged home once awake and alert.  She will be placed on amoxicillin 800 mg p.o. b.i.d. for 5 days, and Roxicet 5-19ml po q 6 hours for postop pain control.   The patient will follow up in my office in approximately 2 weeks.  Lam Bjorklund,SUI W 04/02/2012 10:30 AM

## 2012-04-02 NOTE — Transfer of Care (Signed)
Immediate Anesthesia Transfer of Care Note  Patient: Hannah Schultz  Procedure(s) Performed: Procedure(s) (LRB): TONSILLECTOMY (Bilateral)  Patient Location: PACU  Anesthesia Type: General  Level of Consciousness: responds to stimulation, follows commands  Airway & Oxygen Therapy: Patient Spontanous Breathing and Patient connected to nasal cannula oxygen  Post-op Assessment: Report given to PACU RN and Post -op Vital signs reviewed and stable  Post vital signs: Reviewed and stable  Complications: No apparent anesthesia complications

## 2012-04-02 NOTE — Anesthesia Postprocedure Evaluation (Signed)
  Anesthesia Post-op Note  Patient: Hannah Schultz  Procedure(s) Performed: Procedure(s) (LRB): TONSILLECTOMY (Bilateral)  Patient Location: PACU  Anesthesia Type: General  Level of Consciousness: awake  Airway and Oxygen Therapy: Patient Spontanous Breathing and Patient connected to nasal cannula oxygen  Post-op Pain: mild  Post-op Assessment: Post-op Vital signs reviewed, Patient's Cardiovascular Status Stable, Respiratory Function Stable, Patent Airway and No signs of Nausea or vomiting  Post-op Vital Signs: Reviewed and stable  Complications: No apparent anesthesia complications

## 2012-04-02 NOTE — Brief Op Note (Signed)
04/02/2012  10:29 AM  PATIENT:  Hannah Schultz  51 y.o. female  PRE-OPERATIVE DIAGNOSIS:  Tonsillar hypertrophy and chronic tonsillitis  POST-OPERATIVE DIAGNOSIS:  Tonsillar hypertrophy and chronic tonsillitis  PROCEDURE:  Procedure(s) (LRB): TONSILLECTOMY (Bilateral)  SURGEON:  Surgeon(s) and Role:    * Ascencion Dike, MD - Primary  PHYSICIAN ASSISTANT:   ASSISTANTS: none   ANESTHESIA:   general  EBL:     BLOOD ADMINISTERED:none  DRAINS: none   LOCAL MEDICATIONS USED:  NONE  SPECIMEN:  Source of Specimen:  Bilateral tonsils  DISPOSITION OF SPECIMEN:  PATHOLOGY  COUNTS:  YES  TOURNIQUET:  * No tourniquets in log *  DICTATION: .Note written in EPIC  PLAN OF CARE: Discharge to home after PACU  PATIENT DISPOSITION:  PACU - hemodynamically stable.   Delay start of Pharmacological VTE agent (>24hrs) due to surgical blood loss or risk of bleeding: not applicable

## 2012-04-02 NOTE — H&P (Signed)
Cc: Recurrent tonsillitis, halitosis, loud snoring  HPI: The patient returns today complaining of recurrent tonsillitis and halitosis. She has a history of chronic halitosis secondary to cryptic tonsils and chronic rhinitis. She has been treated with Mary's magic mouthwash and Flonase nasal spray. The patient reports improvement in her nasal drainage. However, she complains that her throat symptoms have not improved with Mary's magic mouthwash. The patient also complains of loud snoring at night. She has a history of obstructive sleep apnea. No other ENT, GI, or respiratory issue noted since the last visit.  The patient's review of systems (constitutional, eyes, ENT, cardiovascular, respiratory, GI, musculoskeletal, skin, neurologic, psychiatric, endocrine, hematologic, allergic) is noted in the ROS questionnaire.  It is reviewed with the patient.    Past Medical History (Major events, hospitalizations, surgeries):  Hysterectomy.     Known allergies: NKDA.     Ongoing medical problems: Hypertension, reflux, depression.     Family medical history: Diabetes, heart disease.     Social history: The patient is married. She denies the use of alcohol, tobacco or illegal drugs.  Exam: General: Communicates without difficulty, well nourished, no acute distress. Head: Normocephalic, no evidence injury, no tenderness, facial buttresses intact without stepoff. Eyes: PERRL, EOMI. No scleral icterus, conjunctivae clear. Neuro: CN II exam reveals vision grossly intact. No nystagmus at any point of gaze. Ears: Auricles well formed without lesions. Ear canals are intact without mass or lesion. No erythema or edema is appreciated. The TMs are intact without fluid. Nose: External evaluation reveals normal support and skin without lesions. Dorsum is intact. Anterior rhinoscopy reveals congested mucosa and crusting over anterior aspect of inferior turbinates and nasal cavity. A small 65mm anterior septal perforation is  noted. No bleeding or purulence. Oral:  Oral cavity and oropharynx are intact, symmetric, without erythema or edema.  Mucosa is moist without lesions. Tonsils are 2+ and cryptic. Tonsils free of erythema and exudate. Neck: Full range of motion without pain. There is no significant lymphadenopathy. No masses palpable. Thyroid bed within normal limits to palpation. Parotid glands and submandibular glands equal bilaterally without mass. Trachea is midline. Neuro:  CN 2-12 grossly intact. Wide-based gait.  A: Chronic tonsillitis/pharyngitis with tonsillar hypertrophy.  P: 1. The treatment options include continuing conservative observation versus tonsillectomy. Based on the patient's history and physical exam findings, she will likely benefit from having the tonsils and possible adenoid removed. The risks, benefits, alternatives, and details of the procedure are reviewed with the patient. Questions are invited and answered.  2. The patient is interested in proceeding with the  procedure. We will schedule the procedure for [facility] main OR.  Kaijah Abts Raynelle Bring, MD

## 2012-04-02 NOTE — Discharge Instructions (Addendum)
[facility] Raynelle Bring M.D., P.A[facility] Postoperative Instructions for Tonsillectomy & Adenoidectomy (T&A) Activity Restrict activity at home for the first two days, resting as much as possible. Light indoor activity is best. You may usually return to school or work within a week but void strenuous activity and sports for two weeks. Sleep with your head elevated on 2-3 pillows for 3-4 days to help decrease swelling. Diet Due to tissue swelling and throat discomfort, you may have little desire to drink for several days. However fluids are very important to prevent dehydration. You will find that non-acidic juices, soups, popsicles, Jell-O, custard, puddings, and any soft or mashed foods taken in small quantities can be swallowed fairly easily. Try to increase your fluid and food intake as the discomfort subsides. It is recommended that a child receive 1-1/2 quarts of fluid in a 24-hour period. Adult require twice this amount.  Discomfort Your sore throat may be relieved by applying an ice collar to your neck and/or by taking Tylenol. You may experience an earache, which is due to referred pain from the throat. Referred ear pain is commonly felt at night when trying to rest.  Bleeding                        Although rare, there is risk of having some bleeding during the first 2 weeks after having a T&A. This usually happens between days 7-10 postoperatively. If you or your child should have any bleeding, try to remain calm. We recommend sitting up quietly in a chair and gently spitting out the blood into a bowl. For adults, gargling gently with ice water may help. If the bleeding does not stop after a short time (5 minutes), is more than 1 teaspoonful, or if you become worried, please call our office at [phone] or go directly to the nearest hospital emergency room. Do not eat or drink anything prior to going to the hospital as you may need to be taken to the operating room in order to control the bleeding. GENERAL  CONSIDERATIONS 1. Brush your teeth regularly. Avoid mouthwashes and gargles for three weeks. You may gargle gently with warm salt-water as necessary or spray with Chloraseptic. You may make salt-water by placing 2 teaspoons of table salt into a quart of fresh water. Warm the salt-water in a microwave to a luke warm temperature.  2. Avoid exposure to colds and upper respiratory infections if possible.  3. If you look into a mirror or into your child's mouth, you will see white-gray patches in the back of the throat. This is normal after having a T&A and is like a scab that forms on the skin after an abrasion. It will disappear once the back of the throat heals completely. However, it may cause a noticeable odor; this too will disappear with time. Again, warm salt-water gargles may be used to help keep the throat clean and promote healing.  4. You may notice a temporary change in voice quality, such as a higher pitched voice or a nasal sound, until healing is complete. This may last for 1-2 weeks and should resolve.  5. Do not take or give you child any medications that we have not prescribed or recommended.  6. Snoring may occur, especially at night, for the first week after a T&A. It is due to swelling of the soft palate and will usually resolve.  Please call our office at [phone] if you have any questions.  Instructions Following General Anesthetic, Adult A nurse specialized in giving anesthesia (anesthetist) or a doctor specialized in giving anesthesia (anesthesiologist) gave you a medicine that made you sleep while a procedure was performed. For as long as 24 hours following this procedure, you may feel:  Dizzy.   Weak.   Drowsy.  AFTER THE PROCEDURE After surgery, you will be taken to the recovery area where a nurse will monitor your progress. You will be allowed to go home when you are awake, stable, taking fluids well, and without complications. For the first 24 hours following an  anesthetic:  Have a responsible person with you.   Do not drive a car. If you are alone, do not take public transportation.   Do not drink alcohol.   Do not take medicine that has not been prescribed by your caregiver.   Do not sign important papers or make important decisions.   You may resume normal diet and activities as directed.   Change bandages (dressings) as directed.   Only take over-the-counter or prescription medicines for pain, discomfort, or fever as directed by your caregiver.  If you have questions or problems that seem related to the anesthetic, call the hospital and ask for the anesthetist or anesthesiologist on call. SEEK IMMEDIATE MEDICAL CARE IF:   You develop a rash.   You have difficulty breathing.   You have chest pain.   You develop any allergic problems.  Document Released: 03/04/2001 Document Revised: 11/15/2011 Document Reviewed: 10/13/2007 [facility] Patient Information 2012 [place].

## 2012-04-02 NOTE — Preoperative (Signed)
Beta Blockers   Reason not to administer Beta Blockers:Not Applicable, took Bystolic at [id number]

## 2012-04-02 NOTE — Anesthesia Preprocedure Evaluation (Addendum)
Anesthesia Evaluation  Patient identified by MRN, date of birth, ID band Patient awake    Reviewed: Allergy & Precautions, H&P , NPO status , Patient's Chart, lab work & pertinent test results, reviewed documented beta blocker date and time   Airway Mallampati: II TM Distance: >3 FB Neck ROM: Full    Dental No notable dental hx. (+) Teeth Intact and Dental Advisory Given   Pulmonary sleep apnea ,  breath sounds clear to auscultation  Pulmonary exam normal       Cardiovascular hypertension, On Medications and On Home Beta Blockers Rhythm:Regular Rate:Normal     Neuro/Psych PSYCHIATRIC DISORDERS negative neurological ROS     GI/Hepatic Neg liver ROS, GERD-  Medicated and Controlled,  Endo/Other  Diabetes mellitus-, Type 1, Insulin DependentMorbid obesity  Renal/GU negative Renal ROS  negative genitourinary   Musculoskeletal   Abdominal (+) + obese,   Peds  Hematology negative hematology ROS (+)   Anesthesia Other Findings   Reproductive/Obstetrics negative OB ROS                          Anesthesia Physical Anesthesia Plan  ASA: III  Anesthesia Plan: General   Post-op Pain Management:    Induction: Intravenous  Airway Management Planned: Oral ETT  Additional Equipment:   Intra-op Plan:   Post-operative Plan: Extubation in OR  Informed Consent: I have reviewed the patients History and Physical, chart, labs and discussed the procedure including the risks, benefits and alternatives for the proposed anesthesia with the patient or authorized representative who has indicated his/her understanding and acceptance.   Dental advisory given  Plan Discussed with: CRNA and Surgeon  Anesthesia Plan Comments:        Anesthesia Quick Evaluation

## 2012-04-03 ENCOUNTER — Encounter (HOSPITAL_COMMUNITY): Payer: Self-pay | Admitting: Otolaryngology

## 2012-04-03 ENCOUNTER — Telehealth (INDEPENDENT_AMBULATORY_CARE_PROVIDER_SITE_OTHER): Payer: Self-pay

## 2012-04-03 NOTE — Progress Notes (Signed)
Clinical Social Work Department BRIEF PSYCHOSOCIAL ASSESSMENT 04/03/2012  Patient:  Hannah Schultz, Hannah Schultz     Account Number:  [id number]     Admit date:  03/30/2012  Clinical Social Worker:  [employer]  Date/Time:  04/02/2012 02:00 PM  Referred by:  RN  Date Referred:  04/02/2012 Referred for  Other - See comment   Other Referral:   food, nutrition   Interview type:  Patient Other interview type:   and patient mother    PSYCHOSOCIAL DATA Living Status:  FAMILY Admitted from facility:   Level of care:   Primary support name:  Ms. Markgraf Primary support relationship to patient:  PARENT Degree of support available:   adequate    CURRENT CONCERNS Current Concerns  Other - See comment   Other Concerns:   food resources    SOCIAL WORK ASSESSMENT / PLAN CSW met iwht pt and pt mother in patient room in regards to food resources. patient stated that since his mother lost her job, sometimes he is not able to get as much food as he needs. Pt states he eats, however needs more food.    CSW and Pt discussed food pantries, and the free meals in . pt stated that he had tranportation to get to the pantries and to the locations of meals.    Pt mother who was present in the room stated that she needed a job, and this csw needed to find her one.    CSW and Pt mother discussed the [facility] and their role in helping find employment and other resources iwthin the community for food and assistance. CSW provided pt and pt mother with the irc information and food pantry information.    CSW and pt also dicussed applying for food stamps at [place].    Pt plans to dc home with self car.e no futher csw needs, sign off.   Assessment/plan status:  No Further Intervention Required Other assessment/ plan:   Information/referral to community resources:   food pantry, irc, and dss-food stamps    PATIENT'S/FAMILY'S RESPONSE TO PLAN OF CARE: Pt appreicatied csw concern and support. Pt  mother was frustrated and wanted csw to find pt mother employment. CSW provided pt and pt family with adequate resources. Pt and pt family agreed this should help in the mean time while pt mother looks for a job. no further csw needs, sign off.      Dorathy Kinsman, [place]  [phone] 04/02/2012 1600 pm

## 2012-04-03 NOTE — Telephone Encounter (Signed)
Incoming call from Mrs. Hannah Schultz, called in stating she's having difficulty eating, drinking and pain when swallowing.  Review of records shows patient is s/p Tonsillectomy by Dr. Benjamine Mola.  Patient given correct phone number to Surgeon that performed her procedure.

## 2012-04-17 ENCOUNTER — Ambulatory Visit (INDEPENDENT_AMBULATORY_CARE_PROVIDER_SITE_OTHER): Payer: Medicare HMO | Admitting: Otolaryngology

## 2012-08-13 ENCOUNTER — Other Ambulatory Visit (HOSPITAL_COMMUNITY): Payer: Self-pay | Admitting: Pulmonary Disease

## 2012-08-13 DIAGNOSIS — Z139 Encounter for screening, unspecified: Secondary | ICD-10-CM

## 2012-08-19 ENCOUNTER — Ambulatory Visit (HOSPITAL_COMMUNITY)
Admission: RE | Admit: 2012-08-19 | Discharge: 2012-08-19 | Disposition: A | Discharge status: Home / Self Care | Payer: Medicare HMO | Source: Ambulatory Visit | Attending: Pulmonary Disease | Admitting: Pulmonary Disease

## 2012-08-19 DIAGNOSIS — Z139 Encounter for screening, unspecified: Secondary | ICD-10-CM

## 2012-08-19 DIAGNOSIS — Z1231 Encounter for screening mammogram for malignant neoplasm of breast: Secondary | ICD-10-CM | POA: Insufficient documentation

## 2012-11-19 ENCOUNTER — Other Ambulatory Visit (HOSPITAL_COMMUNITY): Payer: Self-pay | Admitting: Pulmonary Disease

## 2012-11-19 ENCOUNTER — Ambulatory Visit (HOSPITAL_COMMUNITY)
Admission: RE | Admit: 2012-11-19 | Discharge: 2012-11-19 | Disposition: A | Discharge status: Home / Self Care | Payer: Medicare HMO | Source: Ambulatory Visit | Attending: Pulmonary Disease | Admitting: Pulmonary Disease

## 2012-11-19 DIAGNOSIS — M79609 Pain in unspecified limb: Secondary | ICD-10-CM | POA: Insufficient documentation

## 2012-11-19 DIAGNOSIS — M79646 Pain in unspecified finger(s): Secondary | ICD-10-CM

## 2012-11-20 LAB — BASIC METABOLIC PANEL
BUN: 13 (ref 4–21)
Chloride: 102 (ref 99–108)
Creatinine: 0.7 (ref <=1.1)
Glucose: 119
Potassium: 4.2 (ref 3.4–5.3)
Sodium: 139 (ref 137–147)

## 2012-11-20 LAB — LIPID PANEL
Cholesterol: 166 (ref 0–200)
HDL: 51 (ref 35–70)
LDL Cholesterol: 89
Triglycerides: 128 (ref 40–160)

## 2012-11-20 LAB — HEPATIC FUNCTION PANEL
ALT: 20 (ref 7–35)
AST: 18 (ref 13–35)
Alkaline Phosphatase: 84 (ref 25–125)

## 2012-11-20 LAB — POCT ERYTHROCYTE SEDIMENTATION RATE, NON-AUTOMATED: Sed Rate: 32

## 2012-11-20 LAB — COMPREHENSIVE METABOLIC PANEL: Calcium: 9.1 (ref 8.7–10.7)

## 2012-11-27 ENCOUNTER — Encounter: Payer: Self-pay | Admitting: Orthopedic Surgery

## 2012-11-27 ENCOUNTER — Ambulatory Visit (INDEPENDENT_AMBULATORY_CARE_PROVIDER_SITE_OTHER): Payer: Medicare HMO | Admitting: Orthopedic Surgery

## 2012-11-27 ENCOUNTER — Telehealth: Payer: Self-pay | Admitting: *Deleted

## 2012-11-27 VITALS — BP 150/90 | Ht 65.0 in | Wt 267.6 lb

## 2012-11-27 DIAGNOSIS — M65311 Trigger thumb, right thumb: Secondary | ICD-10-CM | POA: Insufficient documentation

## 2012-11-27 DIAGNOSIS — M6530 Trigger finger, unspecified finger: Secondary | ICD-10-CM

## 2012-11-27 DIAGNOSIS — G5600 Carpal tunnel syndrome, unspecified upper limb: Secondary | ICD-10-CM

## 2012-11-27 MED ORDER — HYDROCODONE-ACETAMINOPHEN 5-325 MG PO TABS: 1.0000 | ORAL_TABLET | Freq: Four times a day (QID) | ORAL | Status: DC | PRN

## 2012-11-27 NOTE — Patient Instructions (Addendum)
Carpal tunnel test   Wear splint x 10 days   After 10 days wear wrist splint at night   Take norco 5 mg for pain

## 2012-11-27 NOTE — Progress Notes (Signed)
Patient ID: Hannah Schultz, female   DOB: Mar 06, 1961, 51 y.o.   MRN: [id number] Chief Complaint  Patient presents with  . Hand Pain    Right hand pain finger locking up and pain running up arm. Started 2 months ago no injury.    HPI: 51 YO female with two-month history of pain in her palm radiating upper arm and locking of the RIGHT thumb. She has her brace that she wears during the day and she is taking some ibuprofen without relief.  She complains of sharp throbbing, burning, 9/10. Constant pain, worse with writing, associated with tingling, swelling, and locking especially of the thumb. Review of systems is negative for chest pain, and adverse food, reaction otherwise, all other systems had a positive finding including snoring, heartburn, frequency, itching, nervousness, anxiety, depression, easy bleeding and bruising, excessive thirst, and excessive urination, as well as blurred vision, bordering of the eyes and weight gain.  Past Medical History  Diagnosis Date  . Hypertension   . Diabetes mellitus   . Diabetic neuropathy   . Depression   . Post traumatic stress disorder (PTSD)   . Acid reflux   . Sleep apnea     Physical Exam(12)  Vital signs:   GENERAL: normal development   CDV: pulses are normal   Skin: normal  Lymph: nodes were not palpable/normal  Psychiatric: awake, alert and oriented  Neuro: Decreased sensation in the RIGHT upper extremity, mainly in the hand along the palm area MSK  Gait: Ambulation is normal 1 Inspection RIGHT hand shows a locked RIGHT thumb in flexion, tenderness in the palm 2 Range of Motion Normal range of motion in the wrist 3 Motor No muscle atrophy 4 Stability Wrist joint, stable  Imaging No imaging  Assessment: Differential diagnosis includes trigger thumb, and carpal tunnel syndrome. Cervical radiculopathy. However, the pain is radiating distal to proximal    Plan: Injected the RIGHT trigger thumb and splint in extension. She is  already on Neurontin continue. Had Norco for pain relief.  Recommend carpal tunnel test. Return after test for review.

## 2012-11-27 NOTE — Telephone Encounter (Signed)
Faxed referral to Dr. Merlene Laughter. Awaiting appointment

## 2012-12-29 ENCOUNTER — Other Ambulatory Visit: Payer: Self-pay | Admitting: *Deleted

## 2012-12-29 DIAGNOSIS — M65319 Trigger thumb, unspecified thumb: Secondary | ICD-10-CM

## 2013-01-20 ENCOUNTER — Other Ambulatory Visit (HOSPITAL_COMMUNITY): Payer: Self-pay | Admitting: Pulmonary Disease

## 2013-01-20 ENCOUNTER — Ambulatory Visit (HOSPITAL_COMMUNITY)
Admission: RE | Admit: 2013-01-20 | Discharge: 2013-01-20 | Disposition: A | Discharge status: Home / Self Care | Payer: Medicare HMO | Source: Ambulatory Visit | Attending: Pulmonary Disease | Admitting: Pulmonary Disease

## 2013-01-20 DIAGNOSIS — R079 Chest pain, unspecified: Secondary | ICD-10-CM

## 2013-07-06 ENCOUNTER — Other Ambulatory Visit: Payer: Self-pay | Admitting: *Deleted

## 2013-07-06 MED ORDER — INSULIN GLARGINE 100 UNIT/ML ~~LOC~~ SOLN: SUBCUTANEOUS | Status: DC

## 2013-07-06 MED ORDER — INSULIN REGULAR HUMAN (CONC) 500 UNIT/ML ~~LOC~~ SOLN: SUBCUTANEOUS | Status: DC

## 2013-07-20 ENCOUNTER — Other Ambulatory Visit: Payer: Self-pay | Admitting: *Deleted

## 2013-07-20 DIAGNOSIS — E119 Type 2 diabetes mellitus without complications: Secondary | ICD-10-CM | POA: Insufficient documentation

## 2013-07-22 ENCOUNTER — Other Ambulatory Visit (HOSPITAL_COMMUNITY): Payer: Self-pay | Admitting: Pulmonary Disease

## 2013-07-22 ENCOUNTER — Ambulatory Visit: Payer: Medicare HMO | Admitting: Endocrinology

## 2013-07-22 ENCOUNTER — Other Ambulatory Visit (INDEPENDENT_AMBULATORY_CARE_PROVIDER_SITE_OTHER): Payer: Medicare HMO

## 2013-07-22 DIAGNOSIS — E119 Type 2 diabetes mellitus without complications: Secondary | ICD-10-CM

## 2013-07-22 DIAGNOSIS — Z139 Encounter for screening, unspecified: Secondary | ICD-10-CM

## 2013-07-22 LAB — URINALYSIS
Bilirubin Urine: NEGATIVE
Hgb urine dipstick: NEGATIVE
Nitrite: NEGATIVE
Urine Glucose: >=1000
Urobilinogen, UA: 0.2 (ref 0.0–1.0)

## 2013-07-22 LAB — MICROALBUMIN / CREATININE URINE RATIO: Microalb, Ur: 1.2 mg/dL (ref 0.0–1.9)

## 2013-07-22 LAB — COMPREHENSIVE METABOLIC PANEL
ALT: 28 U/L (ref 0–35)
CO2: 27 mEq/L (ref 19–32)
Calcium: 9 mg/dL (ref 8.4–10.5)
Chloride: 104 mEq/L (ref 96–112)
GFR: 103.52 mL/min (ref >60.00)
Glucose, Bld: 88 mg/dL (ref 70–99)
Sodium: 139 mEq/L (ref 135–145)
Total Bilirubin: 0.6 mg/dL (ref 0.3–1.2)
Total Protein: 7.3 g/dL (ref 6.0–8.3)

## 2013-08-04 ENCOUNTER — Encounter: Payer: Self-pay | Admitting: Endocrinology

## 2013-08-04 ENCOUNTER — Ambulatory Visit (INDEPENDENT_AMBULATORY_CARE_PROVIDER_SITE_OTHER): Payer: Medicare HMO | Admitting: Endocrinology

## 2013-08-04 VITALS — BP 110/70 | HR 63 | Temp 98.1°F | Resp 12 | Ht 65.0 in | Wt 264.1 lb

## 2013-08-04 DIAGNOSIS — E119 Type 2 diabetes mellitus without complications: Secondary | ICD-10-CM

## 2013-08-04 MED ORDER — INSULIN DETEMIR 100 UNIT/ML FLEXPEN: 90.0000 [IU] | PEN_INJECTOR | Freq: Two times a day (BID) | SUBCUTANEOUS | Status: DC

## 2013-08-04 MED ORDER — PIOGLITAZONE HCL 15 MG PO TABS: 15.0000 mg | ORAL_TABLET | Freq: Every day | ORAL | Status: DC

## 2013-08-04 NOTE — Patient Instructions (Addendum)
Pioglitazone 15 mg daily  U-500 insulin 18 in am and lunch and 10 at supper  Lantus 60 in am 25 in pm and check on cost of Levemir  Invokana $RemoveB'300mg'MgsRRKit$ , 1/2 daily

## 2013-08-04 NOTE — Progress Notes (Signed)
Patient ID: Hannah Schultz, female   DOB: [birth year], 52 y.o.   MRN: [id number]  Hannah Schultz is an 52 y.o. female.   Reason for Appointment: Diabetes follow-up   History of Present Illness   Diagnosis: Type 2 DIABETES MELITUS, long-standing         She has had poorly controlled diabetes for several years and has been on various treatment regimens She has usually required very large doses of insulin but with continued poor control She also has difficulty losing weight  Since she was not well controlled with basal bolus regimen of Lantus and NovoLog the NovoLog was changed to U-500 insulin  about 6 months ago Her dose was adjusted on the last visit but she is still having consistently high readings during the day However her fasting blood sugars are generally better She was also given Invokana 2 months ago to help with control but blood sugars have been only modestly better She also has had some difficulties with yeast infections and has taken Diflucan twice Also the last 2 weeks she has not taken any U-500 insulin because of cost   Oral hypoglycemic drugs: Invokana       Side effects from medications: victoza and metformin cause GI side effects, candidiasis from Invokana Insulin regimen: Lantus 55 units AM, 30 in p.m. U.-500 a.c. 15-15-12 units           Proper timing of medications in relation to meals: Yes.She is   taking U-500 insulin about 20 minutes before eating        Monitors blood glucose: Once a day.    Glucometer: ?         Blood Glucose readings from recall: readings before breakfast: 243 today; usually readings before: Am 125; acl 212  Acs 230. At bedtime 186   Hypoglycemia frequency: Never.          Meals: 3 meals per day.          Physical activity: exercise: Unable to do much           Dietician visit: Most recent: ? 2 years ago    The last HbgA1c was reported as 8.5?  Wt Readings from Last 3 Encounters:  08/04/13 264 lb 1.6 oz (119.795 kg)  11/27/12 267 lb 9.6  oz (121.383 kg)  03/26/12 256 lb 2.8 oz (116.2 kg)    No visits with results within 1 Week(s) from this visit. Latest known visit with results is:  Appointment on 07/22/2013  Component Date Value Range Status  . Color, Urine 07/22/2013 LT. YELLOW  Yellow;Lt. Yellow Final  . APPearance 07/22/2013 CLEAR  Clear Final  . Specific Gravity, Urine 07/22/2013 1.020  1.000-1.030 Final  . pH 07/22/2013 5.5  5.0 - 8.0 Final  . Total Protein, Urine 07/22/2013 NEGATIVE  Negative Final  . Urine Glucose 07/22/2013 >=1000  Negative Final  . Ketones, ur 07/22/2013 NEGATIVE  Negative Final  . Bilirubin Urine 07/22/2013 NEGATIVE  Negative Final  . Hgb urine dipstick 07/22/2013 NEGATIVE  Negative Final  . Urobilinogen, UA 07/22/2013 0.2  0.0 - 1.0 Final  . Leukocytes, UA 07/22/2013 NEGATIVE  Negative Final  . Nitrite 07/22/2013 NEGATIVE  Negative Final  . Microalb, Ur 07/22/2013 1.2  0.0 - 1.9 mg/dL Final  . Creatinine,U 07/22/2013 101.6   Final  . Microalb Creat Ratio 07/22/2013 1.2  0.0 - 30.0 mg/g Final  . Hemoglobin A1C 07/22/2013 9.6* 4.6 - 6.5 % Final   Glycemic Control Guidelines for People  with Diabetes:Non Diabetic:  <6%Goal of Therapy: <7%Additional Action Suggested:  >8%   . Sodium 07/22/2013 139  135 - 145 mEq/L Final  . Potassium 07/22/2013 3.6  3.5 - 5.1 mEq/L Final  . Chloride 07/22/2013 104  96 - 112 mEq/L Final  . CO2 07/22/2013 27  19 - 32 mEq/L Final  . Glucose, Bld 07/22/2013 88  70 - 99 mg/dL Final  . BUN 07/22/2013 13  6 - 23 mg/dL Final  . Creatinine, Ser 07/22/2013 0.8  0.4 - 1.2 mg/dL Final  . Total Bilirubin 07/22/2013 0.6  0.3 - 1.2 mg/dL Final  . Alkaline Phosphatase 07/22/2013 88  39 - 117 U/L Final  . AST 07/22/2013 26  0 - 37 U/L Final  . ALT 07/22/2013 28  0 - 35 U/L Final  . Total Protein 07/22/2013 7.3  6.0 - 8.3 g/dL Final  . Albumin 07/22/2013 3.9  3.5 - 5.2 g/dL Final  . Calcium 07/22/2013 9.0  8.4 - 10.5 mg/dL Final  . GFR 07/22/2013 103.52  >60.00 mL/min  Final      Medication List       This list is accurate as of: 08/04/13 12:11 PM.  Always use your most recent med list.               atorvastatin 40 MG tablet  Commonly known as:  LIPITOR  Take 40 mg by mouth daily.     BD INSULIN SYRINGE ULTRAFINE 31G X 5/16" 1 ML Misc  Generic drug:  Insulin Syringe-Needle U-100     citalopram 40 MG tablet  Commonly known as:  CELEXA  Take 40 mg by mouth daily.     clonazePAM 0.5 MG tablet  Commonly known as:  KLONOPIN  Take 0.5 mg by mouth 3 (three) times daily as needed. For anxiety     fluconazole 100 MG tablet  Commonly known as:  DIFLUCAN     gabapentin 300 MG capsule  Commonly known as:  NEURONTIN  Take 600 mg by mouth 3 (three) times daily.     HYDROcodone-acetaminophen 5-325 MG per tablet  Commonly known as:  NORCO/VICODIN  Take 1 tablet by mouth every 6 (six) hours as needed for pain.     insulin glargine 100 UNIT/ML injection  Commonly known as:  LANTUS  Take 55 units in the am and 30 units in pm     insulin regular human CONCENTRATED 500 UNIT/ML Soln injection  Commonly known as:  HUMULIN R  Take 15 units in the am, 15 units at lunch and 12 units in pm     insulin regular 100 units/mL injection  Commonly known as:  NOVOLIN R,HUMULIN R  Inject into the skin 3 (three) times daily before meals.     INVOKANA 300 MG Tabs  Generic drug:  Canagliflozin  Take 300 mg by mouth.     nebivolol 5 MG tablet  Commonly known as:  BYSTOLIC  Take 5 mg by mouth daily.     ONE TOUCH ULTRA TEST test strip  Generic drug:  glucose blood     OVER THE COUNTER MEDICATION  Take 1 tablet by mouth daily as needed. [employer] allergy relief     simvastatin 40 MG tablet  Commonly known as:  ZOCOR  Take 40 mg by mouth every evening.     trazodone 300 MG tablet  Commonly known as:  DESYREL  Take 300 mg by mouth at bedtime.     vitamin B-12 500 MCG tablet  Commonly known as:  CYANOCOBALAMIN  Take 500 mcg by mouth daily.      Vitamin D (Ergocalciferol) 50000 UNITS Caps capsule  Commonly known as:  DRISDOL  Take 50,000 Units by mouth.        Allergies:  Allergies  Allergen Reactions  . Adhesive [Tape] Other (See Comments)    Adhesive tape "tears skin"    Past Medical History  Diagnosis Date  . Hypertension   . Diabetes mellitus   . Diabetic neuropathy   . Depression   . Post traumatic stress disorder (PTSD)   . Acid reflux   . Sleep apnea     Past Surgical History  Procedure Laterality Date  . Abdominal hysterectomy    . Cholecystectomy    . Tonsillectomy  04/02/2012    Procedure: TONSILLECTOMY;  Surgeon: Ascencion Dike, MD;  Location: [facility] OR;  Service: ENT;  Laterality: Bilateral;    Family History  Problem Relation Age of Onset  . Heart disease    . Cancer    . Diabetes      Social History:  reports that she has never smoked. She does not have any smokeless tobacco history on file. She reports that she does not drink alcohol or use illicit drugs.  Review of Systems:  HYPERTENSION:  this is well-controlled with Bystolic, followed by PCP  HYPERLIPIDEMIA: The lipid abnormality consists of elevated LDL and is on treatment with simvastatin.     Examination:   BP 110/70  Pulse 63  Temp(Src) 98.1 F (36.7 C)  Resp 12  Ht $R'5\' 5"'AJ$  (1.651 m)  Wt 264 lb 1.6 oz (119.795 kg)  BMI 43.95 kg/m2  SpO2 97%  Body mass index is 43.95 kg/(m^2).   ASSESSMENT/ PLAN::   Diabetes type 2   Her blood sugars are still poorly controlled despite large doses of insulin and also using Invokana 100 mg She continues to be insulin resistant and having difficulty losing weight Also recently is having trouble with her finances and being in the donut hole and blood sugars are higher with A1c 9.6 from noncompliance  PLAN:  She will be given co-pay cards and forms have been filled out for assistance from drug companies  Start Pioglitazone 15 mg daily for insulin resistance  New doses of insulin: U-500 insulin  18 in am and lunch and 10 at supper  Lantus 60 in am 25 in pm and check on cost of Levemir  May use Invokana $RemoveBefo'300mg'QgFtghYYchS$ , 1/2 daily  Berneda Piccininni 08/04/2013, 12:11 PM

## 2013-08-12 ENCOUNTER — Telehealth: Payer: Self-pay | Admitting: Endocrinology

## 2013-08-12 MED ORDER — INSULIN PEN NEEDLE 32G X 4 MM MISC: 1.0000 | Freq: Four times a day (QID) | Status: DC

## 2013-08-12 NOTE — Telephone Encounter (Signed)
rx sent

## 2013-08-25 ENCOUNTER — Ambulatory Visit (HOSPITAL_COMMUNITY)
Admission: RE | Admit: 2013-08-25 | Discharge: 2013-08-25 | Disposition: A | Discharge status: Home / Self Care | Payer: Medicare HMO | Source: Ambulatory Visit | Attending: Pulmonary Disease | Admitting: Pulmonary Disease

## 2013-08-25 DIAGNOSIS — Z1231 Encounter for screening mammogram for malignant neoplasm of breast: Secondary | ICD-10-CM | POA: Insufficient documentation

## 2013-08-25 DIAGNOSIS — Z139 Encounter for screening, unspecified: Secondary | ICD-10-CM

## 2013-09-04 ENCOUNTER — Emergency Department (HOSPITAL_COMMUNITY): Payer: Medicare HMO

## 2013-09-04 ENCOUNTER — Encounter (HOSPITAL_COMMUNITY): Payer: Self-pay

## 2013-09-04 ENCOUNTER — Ambulatory Visit: Payer: Medicare HMO | Admitting: Endocrinology

## 2013-09-04 ENCOUNTER — Emergency Department (HOSPITAL_COMMUNITY)
Admission: EM | Admit: 2013-09-04 | Discharge: 2013-09-04 | Disposition: A | Discharge status: Home / Self Care | Payer: Medicare HMO | Attending: Emergency Medicine | Admitting: Emergency Medicine

## 2013-09-04 DIAGNOSIS — Z8719 Personal history of other diseases of the digestive system: Secondary | ICD-10-CM | POA: Insufficient documentation

## 2013-09-04 DIAGNOSIS — Z79899 Other long term (current) drug therapy: Secondary | ICD-10-CM | POA: Insufficient documentation

## 2013-09-04 DIAGNOSIS — R61 Generalized hyperhidrosis: Secondary | ICD-10-CM | POA: Insufficient documentation

## 2013-09-04 DIAGNOSIS — R079 Chest pain, unspecified: Secondary | ICD-10-CM

## 2013-09-04 DIAGNOSIS — F329 Major depressive disorder, single episode, unspecified: Secondary | ICD-10-CM | POA: Insufficient documentation

## 2013-09-04 DIAGNOSIS — E1149 Type 2 diabetes mellitus with other diabetic neurological complication: Secondary | ICD-10-CM | POA: Insufficient documentation

## 2013-09-04 DIAGNOSIS — I10 Essential (primary) hypertension: Secondary | ICD-10-CM | POA: Insufficient documentation

## 2013-09-04 DIAGNOSIS — Z794 Long term (current) use of insulin: Secondary | ICD-10-CM | POA: Insufficient documentation

## 2013-09-04 DIAGNOSIS — F3289 Other specified depressive episodes: Secondary | ICD-10-CM | POA: Insufficient documentation

## 2013-09-04 DIAGNOSIS — E7800 Pure hypercholesterolemia, unspecified: Secondary | ICD-10-CM | POA: Insufficient documentation

## 2013-09-04 DIAGNOSIS — E1142 Type 2 diabetes mellitus with diabetic polyneuropathy: Secondary | ICD-10-CM | POA: Insufficient documentation

## 2013-09-04 HISTORY — DX: Pure hypercholesterolemia, unspecified: E78.00

## 2013-09-04 LAB — BASIC METABOLIC PANEL
BUN: 10 mg/dL (ref 6–23)
CO2: 26 mEq/L (ref 19–32)
Calcium: 9.4 mg/dL (ref 8.4–10.5)
Creatinine, Ser: 0.62 mg/dL (ref 0.50–1.10)
Glucose, Bld: 147 mg/dL — ABNORMAL HIGH (ref 70–99)

## 2013-09-04 LAB — CBC
HCT: 42.5 % (ref 36.0–46.0)
Hemoglobin: 13.8 g/dL (ref 12.0–15.0)
MCH: 25.7 pg — ABNORMAL LOW (ref 26.0–34.0)
MCV: 79.1 fL (ref 78.0–100.0)
RBC: 5.37 MIL/uL — ABNORMAL HIGH (ref 3.87–5.11)
WBC: 3.8 10*3/uL — ABNORMAL LOW (ref 4.0–10.5)

## 2013-09-04 MED ORDER — ASPIRIN 81 MG PO CHEW
324.0000 mg | CHEWABLE_TABLET | Freq: Once | ORAL | Status: AC
  Administered 2013-09-04: 324 mg via ORAL
  Filled 2013-09-04: qty 4

## 2013-09-04 NOTE — ED Notes (Signed)
Pt informed about test results.

## 2013-09-04 NOTE — ED Notes (Signed)
Pt c/o constant non radiating Left side chest pain starting last night, pt's endocrinologists sent her to ED for further evaluation. Pt denies N/V/D, cough, congestion, back/abd pain, SOB, or weakness

## 2013-09-04 NOTE — ED Provider Notes (Signed)
CSN: [id number]     Arrival date & time 09/04/13  1131 History   First MD Initiated Contact with Patient 09/04/13 1150     Chief Complaint  Patient presents with  . Chest Pain   (Consider location/radiation/quality/duration/timing/severity/associated sxs/prior Treatment) Patient is a 52 y.o. female presenting with chest pain. The history is provided by the patient.  Chest Pain Pain location:  L chest Associated symptoms: diaphoresis   Associated symptoms: no abdominal pain, no back pain, no headache, no nausea, no numbness, no shortness of breath, not vomiting and no weakness    patient presents with dull left-sided chest pain has been going since yesterday. His been constant. No shortness of breath. No cough. Not worse with exertion. No fevers. She states she saw her endocrinologist today who sent her to the ED. No hemoptysis. No relief with ranitidine. No swelling or legs. She is a diabetic. She does have hypertension high cholesterol  Past Medical History  Diagnosis Date  . Hypertension   . Diabetes mellitus   . Diabetic neuropathy   . Depression   . Post traumatic stress disorder (PTSD)   . Acid reflux   . Sleep apnea   . High cholesterol    Past Surgical History  Procedure Laterality Date  . Abdominal hysterectomy    . Cholecystectomy    . Tonsillectomy  04/02/2012    Procedure: TONSILLECTOMY;  Surgeon: Ascencion Dike, MD;  Location: [facility] OR;  Service: ENT;  Laterality: Bilateral;   Family History  Problem Relation Age of Onset  . Heart disease    . Cancer    . Diabetes     History  Substance Use Topics  . Smoking status: Never Smoker   . Smokeless tobacco: Not on file  . Alcohol Use: No   OB History   Grav Para Term Preterm Abortions TAB SAB Ect Mult Living                 Review of Systems  Constitutional: Positive for diaphoresis. Negative for activity change and appetite change.  HENT: Negative for neck stiffness.   Eyes: Negative for pain.  Respiratory:  Negative for chest tightness and shortness of breath.   Cardiovascular: Positive for chest pain. Negative for leg swelling.  Gastrointestinal: Negative for nausea, vomiting, abdominal pain and diarrhea.  Genitourinary: Negative for flank pain.  Musculoskeletal: Negative for back pain.  Skin: Negative for rash.  Neurological: Negative for weakness, numbness and headaches.  Psychiatric/Behavioral: Negative for behavioral problems.    Allergies  Adhesive  Home Medications   Current Outpatient Rx  Name  Route  Sig  Dispense  Refill  . Canagliflozin (INVOKANA) 300 MG TABS   Oral   Take 300 mg by mouth.         . citalopram (CELEXA) 40 MG tablet   Oral   Take 40 mg by mouth daily.           . clonazePAM (KLONOPIN) 0.5 MG tablet   Oral   Take 0.5 mg by mouth 3 (three) times daily as needed. For anxiety         . gabapentin (NEURONTIN) 300 MG capsule   Oral   Take 600 mg by mouth 3 (three) times daily.          . Insulin Detemir (LEVEMIR FLEXTOUCH) 100 UNIT/ML SOPN   Subcutaneous   Inject 90 Units into the skin 2 (two) times daily.   10 pen   3   . insulin glargine (  LANTUS) 100 UNIT/ML injection   Subcutaneous   Inject 60 Units into the skin daily.         . nebivolol (BYSTOLIC) 5 MG tablet   Oral   Take 5 mg by mouth daily.           . ranitidine (ZANTAC) 75 MG tablet   Oral   Take 75 mg by mouth at bedtime.         . simvastatin (ZOCOR) 40 MG tablet   Oral   Take 40 mg by mouth every evening.         . trazodone (DESYREL) 300 MG tablet   Oral   Take 300 mg by mouth at bedtime.           . vitamin B-12 (CYANOCOBALAMIN) 500 MCG tablet   Oral   Take 500 mcg by mouth daily.           . Vitamin D, Ergocalciferol, (DRISDOL) 50000 UNITS CAPS   Oral   Take 50,000 Units by mouth every 7 (seven) days. Take on monday         . BD INSULIN SYRINGE ULTRAFINE 31G X 5/16" 1 ML MISC               . Insulin Pen Needle (BD PEN NEEDLE NANO U/F) 32G  X 4 MM MISC   Does not apply   1 each by Does not apply route 4 (four) times daily.   150 each   3   . ONE TOUCH ULTRA TEST test strip                BP 102/49  Pulse 57  Temp(Src) 97.9 F (36.6 C) (Oral)  Resp 15  Ht $R'5\' 5"'xY$  (1.651 m)  Wt 263 lb (119.296 kg)  BMI 43.77 kg/m2  SpO2 99% Physical Exam  Nursing note and vitals reviewed. Constitutional: She is oriented to person, place, and time. She appears well-developed and well-nourished.  Patient is obese  HENT:  Head: Normocephalic and atraumatic.  Eyes: EOM are normal. Pupils are equal, round, and reactive to light.  Neck: Normal range of motion. Neck supple.  Cardiovascular: Normal rate, regular rhythm and normal heart sounds.   No murmur heard. Pulmonary/Chest: Effort normal and breath sounds normal. No respiratory distress. She has no wheezes. She has no rales. She exhibits tenderness.  Tenderness to left upper chest wall. No crepitus or deformity.  Abdominal: Soft. Bowel sounds are normal. She exhibits no distension. There is no tenderness. There is no rebound and no guarding.  Musculoskeletal: Normal range of motion.  Neurological: She is alert and oriented to person, place, and time. No cranial nerve deficit.  Skin: Skin is warm and dry.  Psychiatric: She has a normal mood and affect. Her speech is normal.    ED Course  Procedures (including critical care time) Labs Review Labs Reviewed  CBC - Abnormal; Notable for the following:    WBC 3.8 (*)    RBC 5.37 (*)    MCH 25.7 (*)    All other components within normal limits  BASIC METABOLIC PANEL - Abnormal; Notable for the following:    Glucose, Bld 147 (*)    All other components within normal limits  POCT I-STAT TROPONIN I  POCT I-STAT TROPONIN I   Imaging Review Dg Chest 2 View  09/04/2013   CLINICAL DATA:  Chest pain  EXAM: CHEST  2 VIEW  COMPARISON:  01/20/2013  FINDINGS: Heart size is mildly enlarged.  Vascularity is normal. Lungs are clear without  infiltrate effusion or mass.  IMPRESSION: No active cardiopulmonary abnormality.  Mild cardiac enlargement.   Electronically Signed   By: Franchot Gallo M.D.   On: 09/04/2013 12:44     Date: 09/04/2013  Rate: 64  Rhythm: normal sinus rhythm  QRS Axis: normal  Intervals: normal  ST/T Wave abnormalities: normal  Conduction Disutrbances:none  Narrative Interpretation:   Old EKG Reviewed: unchanged   MDM   1. Chest pain    Patient with chest pain since yesterday. EKg reassuring. Enzymes negative x2. Will d/c with cardiology follow up.     Jasper Riling. Alvino Chapel, MD 09/04/13 1622

## 2013-09-16 ENCOUNTER — Ambulatory Visit (INDEPENDENT_AMBULATORY_CARE_PROVIDER_SITE_OTHER): Payer: Medicare HMO | Admitting: Endocrinology

## 2013-09-16 ENCOUNTER — Encounter: Payer: Self-pay | Admitting: Endocrinology

## 2013-09-16 VITALS — BP 122/80 | HR 68 | Temp 97.9°F | Resp 12 | Ht 65.0 in | Wt 267.0 lb

## 2013-09-16 DIAGNOSIS — I10 Essential (primary) hypertension: Secondary | ICD-10-CM

## 2013-09-16 DIAGNOSIS — E785 Hyperlipidemia, unspecified: Secondary | ICD-10-CM

## 2013-09-16 DIAGNOSIS — E119 Type 2 diabetes mellitus without complications: Secondary | ICD-10-CM

## 2013-09-16 LAB — LIPID PANEL
Cholesterol: 224 mg/dL — ABNORMAL HIGH (ref 0–200)
HDL: 49.7 mg/dL (ref >39.00)
Triglycerides: 208 mg/dL — ABNORMAL HIGH (ref 0.0–149.0)
VLDL: 41.6 mg/dL — ABNORMAL HIGH (ref 0.0–40.0)

## 2013-09-16 LAB — BASIC METABOLIC PANEL
BUN: 9 mg/dL (ref 6–23)
Chloride: 104 mEq/L (ref 96–112)
Creatinine, Ser: 0.7 mg/dL (ref 0.4–1.2)
GFR: 121.75 mL/min (ref >60.00)

## 2013-09-16 NOTE — Progress Notes (Signed)
Patient ID: Hannah Schultz, female   DOB: [birth year], 52 y.o.   MRN: [id number]  Hannah Schultz is an 52 y.o. female.   Reason for Appointment: Diabetes follow-up   History of Present Illness    Diagnosis: Type 2 DIABETES MELITUS, long-standing         Previous history: She has had poorly controlled diabetes for several years and has been on various treatment regimens She has usually required very large doses of insulin but with continued poor control She also has difficulty losing weight Since she was not well controlled with basal bolus regimen of Lantus and NovoLog the NovoLog was changed to U-500 insulin  earlier this year  months. The blood sugars did improve only a little but overall had continued poor control However her fasting blood sugars were generally better She was also given Invokana  to help with control but blood sugars improved only modestly She also has had some difficulties with yeast infections from this and has taken Diflucan twice, subsequently doing better with taking a half of 300 mg  RECENT history: For the last 8 weeks she has not taken any U-500 insulin because of cost She was told to try Levemir instead of Lantus because of the high cost but  by mistake she is taking both together and is not taking any mealtime insulin. Blood sugars are poorly controlled with only some good readings in the mornings Still has difficulty losing weight   Insulin regimen: Lantus 60 units BID ? LEVEMIR 90 BID , 30 in p.m. NOT ON U.-500 insulin previously on 15-15-12 units            Oral hypoglycemic drugs: Invokana       Side effects from medications: Victoza and metformin cause GI side effects, candidiasis from Invokana  Proper timing of medications in relation to meals:  not applicable now     Monitors blood glucose: Once a day.    Glucometer:  One Touch         Blood Glucose readings from meter download: readings before breakfast: 81-234, afternoon and evening 236-393 with  only occasional readings and highest readings are bedtime. Her monitor does not have the correct date or time   Hypoglycemia frequency: Never.          Meals: 3 meals per day.          Physical activity: exercise: walking 15 - 20 minutes, on most days           Dietician visit: Most recent: ? 2 years ago     Lab Results  Component Value Date   HGBA1C 9.6* 07/22/2013    Wt Readings from Last 3 Encounters:  09/16/13 267 lb (121.11 kg)  09/04/13 263 lb (119.296 kg)  08/04/13 264 lb 1.6 oz (119.795 kg)        Medication List       This list is accurate as of: 09/16/13 10:49 AM.  Always use your most recent med list.               BD INSULIN SYRINGE ULTRAFINE 31G X 5/16" 1 ML Misc  Generic drug:  Insulin Syringe-Needle U-100     citalopram 40 MG tablet  Commonly known as:  CELEXA  Take 40 mg by mouth daily.     clonazePAM 0.5 MG tablet  Commonly known as:  KLONOPIN  Take 0.5 mg by mouth 3 (three) times daily as needed. For anxiety     gabapentin 300  MG capsule  Commonly known as:  NEURONTIN  Take 600 mg by mouth 3 (three) times daily.     Insulin Detemir 100 UNIT/ML Sopn  Commonly known as:  LEVEMIR FLEXTOUCH  Inject 90 Units into the skin 2 (two) times daily.     insulin glargine 100 UNIT/ML injection  Commonly known as:  LANTUS  Inject 60 Units into the skin daily.     Insulin Pen Needle 32G X 4 MM Misc  Commonly known as:  BD PEN NEEDLE NANO U/F  1 each by Does not apply route 4 (four) times daily.     INVOKANA 300 MG Tabs  Generic drug:  Canagliflozin  Take 300 mg by mouth. Takes 1/2 tablet daily     nebivolol 5 MG tablet  Commonly known as:  BYSTOLIC  Take 5 mg by mouth daily.     ONE TOUCH ULTRA TEST test strip  Generic drug:  glucose blood  3 times per day     pioglitazone 15 MG tablet  Commonly known as:  ACTOS     ranitidine 75 MG tablet  Commonly known as:  ZANTAC  Take 75 mg by mouth at bedtime.     simvastatin 40 MG tablet  Commonly  known as:  ZOCOR  Take 40 mg by mouth every evening.     trazodone 300 MG tablet  Commonly known as:  DESYREL  Take 300 mg by mouth at bedtime.     vitamin B-12 500 MCG tablet  Commonly known as:  CYANOCOBALAMIN  Take 500 mcg by mouth daily.     Vitamin D (Ergocalciferol) 50000 UNITS Caps capsule  Commonly known as:  DRISDOL  Take 50,000 Units by mouth every 7 (seven) days. Take on monday        Allergies:  Allergies  Allergen Reactions  . Adhesive [Tape] Other (See Comments)    Adhesive tape "tears skin"    Past Medical History  Diagnosis Date  . Hypertension   . Diabetes mellitus   . Diabetic neuropathy   . Depression   . Post traumatic stress disorder (PTSD)   . Acid reflux   . Sleep apnea   . High cholesterol     Past Surgical History  Procedure Laterality Date  . Abdominal hysterectomy    . Cholecystectomy    . Tonsillectomy  04/02/2012    Procedure: TONSILLECTOMY;  Surgeon: Ascencion Dike, MD;  Location: [facility] OR;  Service: ENT;  Laterality: Bilateral;    Family History  Problem Relation Age of Onset  . Heart disease    . Cancer    . Diabetes      Social History:  reports that she has never smoked. She does not have any smokeless tobacco history on file. She reports that she does not drink alcohol or use illicit drugs.  Review of Systems:  HYPERTENSION:  this is well-controlled with Bystolic, followed by PCP  HYPERLIPIDEMIA: The lipid abnormality consists of elevated LDL and is on treatment with 40 mg simvastatin.  Had episode of chest pain, evaluated in ER, this was negative     Examination:   BP 122/80  Pulse 68  Temp(Src) 97.9 F (36.6 C)  Resp 12  Ht $R'5\' 5"'Xd$  (1.651 m)  Wt 267 lb (121.11 kg)  BMI 44.43 kg/m2  SpO2 98%  Body mass index is 44.43 kg/(m^2).   ASSESSMENT/ PLAN::   Diabetes type 2   Her blood sugars are still poorly controlled despite large doses of insulin and  also using Invokana 150 mg, last A1c 9.6 She continues to be insulin  resistant and having difficulty losing weight She had misunderstood directions for her insulin and is taking 2 kinds of basal insulin now. Blood sugars are significantly higher nonfasting and only occasionally good in the morning She had done relatively well with U - 500 insulin previously instead of rapid acting analog insulin She will need to go back to Lantus insulin instead of Levemir when she finishes her Levemir as she appears to be requiring larger doses of Levemir Also recently is having trouble with her finances and being in the donut hole   PLAN:  She will be given co-pay cards and forms have been filled out for assistance from drug companies  Start Pioglitazone 15 mg daily for insulin resistance  New doses of insulin: U-500 insulin 18 in am and lunch and 10 at supper  Levemir 90 in am and 60 in pm, may go back to Lantus when Levemir finished. Discussed adjusting the dose in the evening at least by 5 units up or down if blood sugar in the morning over 140 or under 100  May continue Invokana $RemoveBeforeDEI'300mg'[id number]$ , 1/2 daily  Regular exercise  Consider insulin pump, she is currently concerned about the cost  Counseling time over 50% of today's 25 minute visit  Gohan Collister 09/16/2013, 10:49 AM   Addendum: LDL 152, need to see if she is taking her simvastatin

## 2013-09-16 NOTE — Patient Instructions (Addendum)
U-500 INSULIN 30 MIN BEFORE MEALS: 18 in am and lunch and 10 at supper  STAY ON Levemir 90 in am and 60 in pm and go back on Lantus when out of Levemir  Start Pioglitazone 15mg   Please check blood sugars at least half the time about 2 hours after any meal and as directed on waking up.  Please bring blood sugar monitor to each visit

## 2013-09-17 LAB — FRUCTOSAMINE: Fructosamine: 273 umol/L (ref <285)

## 2013-09-17 LAB — LDL CHOLESTEROL, DIRECT: Direct LDL: 152.7 mg/dL

## 2013-09-18 ENCOUNTER — Telehealth: Payer: Self-pay | Admitting: *Deleted

## 2013-09-18 NOTE — Telephone Encounter (Signed)
Left message to return call 

## 2013-09-18 NOTE — Telephone Encounter (Signed)
Message copied by Roxanna Mew on Fri Sep 18, 2013 10:58 AM ------      Message from: Elayne Snare      Created: Fri Sep 18, 2013  8:31 AM       Cholesterol very high, need to know if she is taking her simvastatin ------

## 2013-09-19 DIAGNOSIS — E785 Hyperlipidemia, unspecified: Secondary | ICD-10-CM | POA: Insufficient documentation

## 2013-09-19 DIAGNOSIS — I10 Essential (primary) hypertension: Secondary | ICD-10-CM | POA: Insufficient documentation

## 2013-09-22 ENCOUNTER — Other Ambulatory Visit: Payer: Self-pay | Admitting: *Deleted

## 2013-09-22 MED ORDER — SIMVASTATIN 40 MG PO TABS: 40.0000 mg | ORAL_TABLET | Freq: Every evening | ORAL | Status: DC

## 2013-09-28 ENCOUNTER — Telehealth: Payer: Self-pay | Admitting: *Deleted

## 2013-09-28 NOTE — Telephone Encounter (Signed)
Noted, pt is aware 

## 2013-09-28 NOTE — Telephone Encounter (Signed)
No other substitute at this time

## 2013-09-28 NOTE — Telephone Encounter (Signed)
Pt said she had an allergic reaction to the Actos, she said her lips and throat and face swelled, she called the pharmacist who told her to take 2 benadryl and stop taking the medication.  She said before she tries anything else she would like a sample, she also wants to know if there is something else you want her to try?

## 2013-10-12 ENCOUNTER — Other Ambulatory Visit (INDEPENDENT_AMBULATORY_CARE_PROVIDER_SITE_OTHER): Payer: Commercial Managed Care - HMO

## 2013-10-12 DIAGNOSIS — E119 Type 2 diabetes mellitus without complications: Secondary | ICD-10-CM

## 2013-10-12 LAB — COMPREHENSIVE METABOLIC PANEL
ALT: 21 U/L (ref 0–35)
Albumin: 3.8 g/dL (ref 3.5–5.2)
CO2: 27 mEq/L (ref 19–32)
Calcium: 9.4 mg/dL (ref 8.4–10.5)
Creatinine, Ser: 0.8 mg/dL (ref 0.4–1.2)
GFR: 105.02 mL/min (ref >60.00)
Sodium: 138 mEq/L (ref 135–145)
Total Bilirubin: 0.6 mg/dL (ref 0.3–1.2)

## 2013-10-12 LAB — LIPID PANEL
HDL: 43.8 mg/dL (ref >39.00)
Triglycerides: 256 mg/dL — ABNORMAL HIGH (ref 0.0–149.0)
VLDL: 51.2 mg/dL — ABNORMAL HIGH (ref 0.0–40.0)

## 2013-10-12 LAB — LDL CHOLESTEROL, DIRECT: Direct LDL: 108.5 mg/dL

## 2013-10-15 ENCOUNTER — Ambulatory Visit: Payer: Medicare HMO | Admitting: Endocrinology

## 2013-10-15 DIAGNOSIS — Z0289 Encounter for other administrative examinations: Secondary | ICD-10-CM

## 2013-10-27 ENCOUNTER — Encounter: Payer: Medicare HMO | Attending: Endocrinology

## 2013-10-27 VITALS — Ht 65.0 in | Wt 272.7 lb

## 2013-10-27 DIAGNOSIS — Z713 Dietary counseling and surveillance: Secondary | ICD-10-CM | POA: Insufficient documentation

## 2013-10-27 DIAGNOSIS — IMO0001 Reserved for inherently not codable concepts without codable children: Secondary | ICD-10-CM | POA: Insufficient documentation

## 2013-10-29 NOTE — Progress Notes (Signed)
Patient was seen on 10/27/13 for the first of a series of three diabetes self-management courses at the [facility].  Current HbA1c: 9.5%  10/12/13  The following learning objectives were met by the patient during this class:  Describe diabetes  State some common risk factors for diabetes  Defines the role of glucose and insulin  Identifies type of diabetes and pathophysiology  Describe the relationship between diabetes and cardiovascular risk  State the members of the Healthcare Team  States the rationale for glucose monitoring  State when to test glucose  State their individual Target Range  State the importance of logging glucose readings  Describe how to interpret glucose readings  Identifies A1C target  Explain the correlation between A1c and eAG values  State symptoms and treatment of high blood glucose  State symptoms and treatment of low blood glucose  Explain proper technique for glucose testing  Identifies proper sharps disposal  Handouts given during class include:  Living Well with Diabetes book  Carb Counting and Meal Planning book  Meal Plan Card  Carbohydrate guide  Meal planning worksheet  Low Sodium Flavoring Tips  The diabetes portion plate  Low Carbohydrate Snack Suggestions  A1c to eAG Conversion Chart  Diabetes Medications  Stress Management  Diabetes Recommended Care Schedule  Diabetes Success Plan  Core Class Satisfaction Survey  Your patient has identified their diabetes care support plan as:  [facility]  Staff  Primary care Physician   Follow-Up Plan:  Attend core 2

## 2013-11-03 DIAGNOSIS — IMO0001 Reserved for inherently not codable concepts without codable children: Secondary | ICD-10-CM

## 2013-11-04 NOTE — Progress Notes (Signed)
Patient was seen on 11/03/13 for the second of a series of three diabetes self-management courses at the [facility]. The following learning objectives were met by the patient during this class:   Describe the role of different macronutrients on glucose  Explain how carbohydrates affect blood glucose  State what foods contain the most carbohydrates  Demonstrate carbohydrate counting  Demonstrate how to read Nutrition Facts food label  Describe effects of various fats on heart health  Describe the importance of good nutrition for health and healthy eating strategies  Describe techniques for managing your shopping, cooking and meal planning  List strategies to follow meal plan when dining out  Describe the effects of alcohol on glucose and how to use it safely    Follow-Up Plan:  Attend Core 3  Work towards following your personal food plan.    

## 2013-11-10 ENCOUNTER — Encounter: Payer: Medicare HMO | Attending: Endocrinology

## 2013-11-10 DIAGNOSIS — Z713 Dietary counseling and surveillance: Secondary | ICD-10-CM | POA: Insufficient documentation

## 2013-11-10 DIAGNOSIS — IMO0001 Reserved for inherently not codable concepts without codable children: Secondary | ICD-10-CM | POA: Insufficient documentation

## 2013-11-11 NOTE — Progress Notes (Signed)
Patient was seen on 11/10/13 for the third of a series of three diabetes self-management courses at the [facility]. The following learning objectives were met by the patient during this class:    State the amount of activity recommended for healthy living   Describe activities suitable for individual needs   Identify ways to regularly incorporate activity into daily life   Identify barriers to activity and ways to over come these barriers  Identify diabetes medications being personally used and their primary action for lowering glucose and possible side effects   Describe role of stress on blood glucose and develop strategies to address psychosocial issues   Identify diabetes complications and ways to prevent them  Explain how to manage diabetes during illness   Evaluate success in meeting personal goal   Establish 2-3 goals that they will plan to diligently work on until they return for the  6-month follow-up visit  Goals:  Follow Diabetes Meal Plan as instructed  Aim for 15-30 mins of physical activity daily as tolerated  Bring food record and glucose log to your follow up visit  Your patient has established the following 4 month goals in their individualized success plan:  Count carbohydrates at most meal and snacks  Increase my activity at least 5 days a week for 30 minutes  Take my diabetes medications as scheduled  Test my glucose at least 3 times a day 7 days a week  Your patient has identified these potential barriers to change:  To eat exercise, and take meds on time.  Your patient has identified their diabetes self-care support plan as  [facility]

## 2013-11-14 ENCOUNTER — Ambulatory Visit: Payer: Medicare HMO

## 2013-12-29 ENCOUNTER — Other Ambulatory Visit: Payer: Self-pay | Admitting: *Deleted

## 2013-12-29 MED ORDER — GLUCOSE BLOOD VI STRP: ORAL_STRIP | Status: DC

## 2014-03-03 ENCOUNTER — Other Ambulatory Visit: Payer: Self-pay | Admitting: Endocrinology

## 2014-03-04 ENCOUNTER — Other Ambulatory Visit: Payer: Self-pay | Admitting: Endocrinology

## 2014-03-16 ENCOUNTER — Encounter: Payer: Medicare HMO | Attending: Pulmonary Disease | Admitting: *Deleted

## 2014-03-16 ENCOUNTER — Encounter: Payer: Self-pay | Admitting: *Deleted

## 2014-03-16 NOTE — Patient Instructions (Addendum)
DO NOT SKIP MEALS Eat three small meals daily to include only 1-2 carbohydrate servings... Refer to My Plate picture Have a 1 carb serving/15grams with a protein before bed to prevent AM lows Consider switching to Reduced calorie bread Hannah Schultz) to reduce carb grams Continue walking 1 miles ( 49mins) daily Begin recording glucose readings in log book Avoid fruit juice Always have protein with carbohydrate Reduce portion of pasta to 1C per meal Continue drinking plenty of water.

## 2014-03-16 NOTE — Progress Notes (Signed)
  Patient was seen on 03/16/14 for her 4 month follow-up as a part of the diabetes self-management courses at the [facility].   In review of patient goals and achievements, the following recommendations were made:  DO NOT SKIP MEALS Eat three small meals daily to include only 1-2 carbohydrate servings... Refer to My Plate picture Have a 1 carb serving/15grams with a protein before bed to prevent AM lows Consider switching to Reduced calorie bread Lamar Benes) to reduce carb grams Continue walking 1 miles ( 31mins) daily Begin recording glucose readings in log book Avoid fruit juice Always have protein with carbohydrate Reduce portion of pasta to 1C per meal Continue drinking plenty of water.  Follow up: as needed

## 2014-08-03 ENCOUNTER — Other Ambulatory Visit (HOSPITAL_COMMUNITY): Payer: Self-pay | Admitting: Pulmonary Disease

## 2014-08-03 DIAGNOSIS — Z1231 Encounter for screening mammogram for malignant neoplasm of breast: Secondary | ICD-10-CM

## 2014-08-27 ENCOUNTER — Ambulatory Visit (HOSPITAL_COMMUNITY): Payer: Commercial Managed Care - HMO

## 2014-08-30 ENCOUNTER — Ambulatory Visit (HOSPITAL_COMMUNITY)
Admission: RE | Admit: 2014-08-30 | Discharge: 2014-08-30 | Disposition: A | Discharge status: Home / Self Care | Payer: Medicare HMO | Source: Ambulatory Visit | Attending: Pulmonary Disease | Admitting: Pulmonary Disease

## 2014-08-30 DIAGNOSIS — Z1231 Encounter for screening mammogram for malignant neoplasm of breast: Secondary | ICD-10-CM | POA: Diagnosis not present

## 2014-09-09 ENCOUNTER — Ambulatory Visit (HOSPITAL_COMMUNITY)
Admission: RE | Admit: 2014-09-09 | Discharge: 2014-09-09 | Disposition: A | Discharge status: Home / Self Care | Payer: Medicare HMO | Source: Ambulatory Visit | Attending: Pulmonary Disease | Admitting: Pulmonary Disease

## 2014-09-09 ENCOUNTER — Other Ambulatory Visit (HOSPITAL_COMMUNITY): Payer: Self-pay | Admitting: Pulmonary Disease

## 2014-09-09 DIAGNOSIS — R52 Pain, unspecified: Secondary | ICD-10-CM

## 2014-09-09 DIAGNOSIS — R200 Anesthesia of skin: Secondary | ICD-10-CM

## 2014-09-09 DIAGNOSIS — M25562 Pain in left knee: Secondary | ICD-10-CM | POA: Insufficient documentation

## 2014-09-24 ENCOUNTER — Other Ambulatory Visit: Payer: Self-pay | Admitting: Endocrinology

## 2014-10-01 ENCOUNTER — Other Ambulatory Visit: Payer: Self-pay | Admitting: Endocrinology

## 2014-12-21 ENCOUNTER — Encounter (HOSPITAL_COMMUNITY): Payer: Self-pay | Admitting: *Deleted

## 2014-12-21 ENCOUNTER — Emergency Department (HOSPITAL_COMMUNITY)
Admission: EM | Admit: 2014-12-21 | Discharge: 2014-12-21 | Disposition: A | Discharge status: Home / Self Care | Payer: Medicare HMO | Attending: Emergency Medicine | Admitting: Emergency Medicine

## 2014-12-21 DIAGNOSIS — Z794 Long term (current) use of insulin: Secondary | ICD-10-CM | POA: Diagnosis not present

## 2014-12-21 DIAGNOSIS — I10 Essential (primary) hypertension: Secondary | ICD-10-CM | POA: Diagnosis not present

## 2014-12-21 DIAGNOSIS — Z79899 Other long term (current) drug therapy: Secondary | ICD-10-CM | POA: Diagnosis not present

## 2014-12-21 DIAGNOSIS — F329 Major depressive disorder, single episode, unspecified: Secondary | ICD-10-CM | POA: Insufficient documentation

## 2014-12-21 DIAGNOSIS — Z9049 Acquired absence of other specified parts of digestive tract: Secondary | ICD-10-CM | POA: Insufficient documentation

## 2014-12-21 DIAGNOSIS — E1140 Type 2 diabetes mellitus with diabetic neuropathy, unspecified: Secondary | ICD-10-CM | POA: Insufficient documentation

## 2014-12-21 DIAGNOSIS — R112 Nausea with vomiting, unspecified: Secondary | ICD-10-CM

## 2014-12-21 DIAGNOSIS — E780 Pure hypercholesterolemia: Secondary | ICD-10-CM | POA: Diagnosis not present

## 2014-12-21 DIAGNOSIS — Z90710 Acquired absence of both cervix and uterus: Secondary | ICD-10-CM | POA: Insufficient documentation

## 2014-12-21 DIAGNOSIS — K219 Gastro-esophageal reflux disease without esophagitis: Secondary | ICD-10-CM | POA: Insufficient documentation

## 2014-12-21 DIAGNOSIS — Z8669 Personal history of other diseases of the nervous system and sense organs: Secondary | ICD-10-CM | POA: Diagnosis not present

## 2014-12-21 DIAGNOSIS — R197 Diarrhea, unspecified: Secondary | ICD-10-CM | POA: Insufficient documentation

## 2014-12-21 DIAGNOSIS — R109 Unspecified abdominal pain: Secondary | ICD-10-CM | POA: Insufficient documentation

## 2014-12-21 LAB — CBC WITH DIFFERENTIAL/PLATELET
Basophils Absolute: 0 10*3/uL (ref 0.0–0.1)
Basophils Relative: 0 % (ref 0–1)
EOS PCT: 1 % (ref 0–5)
Eosinophils Absolute: 0.1 10*3/uL (ref 0.0–0.7)
HEMATOCRIT: 39.7 % (ref 36.0–46.0)
Hemoglobin: 13.2 g/dL (ref 12.0–15.0)
LYMPHS PCT: 6 % — AB (ref 12–46)
Lymphs Abs: 0.4 10*3/uL — ABNORMAL LOW (ref 0.7–4.0)
MCH: 26.6 pg (ref 26.0–34.0)
MCHC: 33.2 g/dL (ref 30.0–36.0)
MCV: 80 fL (ref 78.0–100.0)
MONOS PCT: 5 % (ref 3–12)
Monocytes Absolute: 0.3 10*3/uL (ref 0.1–1.0)
Neutro Abs: 5.5 10*3/uL (ref 1.7–7.7)
Neutrophils Relative %: 88 % — ABNORMAL HIGH (ref 43–77)
Platelets: 227 10*3/uL (ref 150–400)
RBC: 4.96 MIL/uL (ref 3.87–5.11)
RDW: 15 % (ref 11.5–15.5)
WBC: 6.2 10*3/uL (ref 4.0–10.5)

## 2014-12-21 LAB — BASIC METABOLIC PANEL
Anion gap: 9 (ref 5–15)
BUN: 15 mg/dL (ref 6–23)
CO2: 24 mmol/L (ref 19–32)
Calcium: 8.8 mg/dL (ref 8.4–10.5)
Chloride: 101 mEq/L (ref 96–112)
Creatinine, Ser: 0.73 mg/dL (ref 0.50–1.10)
GFR calc Af Amer: >90 mL/min (ref >90)
Glucose, Bld: 203 mg/dL — ABNORMAL HIGH (ref 70–99)
Potassium: 4.2 mmol/L (ref 3.5–5.1)
Sodium: 134 mmol/L — ABNORMAL LOW (ref 135–145)

## 2014-12-21 LAB — CBG MONITORING, ED: Glucose-Capillary: 184 mg/dL — ABNORMAL HIGH (ref 70–99)

## 2014-12-21 LAB — LIPASE, BLOOD: LIPASE: 20 U/L (ref 11–59)

## 2014-12-21 MED ORDER — SODIUM CHLORIDE 0.9 % IV BOLUS (SEPSIS)
1000.0000 mL | Freq: Once | INTRAVENOUS | Status: AC
  Administered 2014-12-21: 1000 mL via INTRAVENOUS

## 2014-12-21 MED ORDER — ONDANSETRON HCL 4 MG/2ML IJ SOLN
4.0000 mg | Freq: Once | INTRAMUSCULAR | Status: DC
  Filled 2014-12-21: qty 2

## 2014-12-21 MED ORDER — HYDROMORPHONE HCL 1 MG/ML IJ SOLN
1.0000 mg | Freq: Once | INTRAMUSCULAR | Status: AC
  Administered 2014-12-21: 1 mg via INTRAVENOUS
  Filled 2014-12-21: qty 1

## 2014-12-21 MED ORDER — ONDANSETRON 8 MG PO TBDP: 8.0000 mg | ORAL_TABLET | Freq: Three times a day (TID) | ORAL | Status: DC | PRN

## 2014-12-21 MED ORDER — ONDANSETRON HCL 4 MG/2ML IJ SOLN
4.0000 mg | Freq: Once | INTRAMUSCULAR | Status: AC
  Administered 2014-12-21: 4 mg via INTRAVENOUS

## 2014-12-21 NOTE — ED Provider Notes (Signed)
CSN: [id number]     Arrival date & time 12/21/14  0217 History   First MD Initiated Contact with Patient 12/21/14 0242     Chief Complaint  Patient presents with  . Abdominal Pain      HPI Patient presents to the emergency department complaining of nausea vomiting and diarrhea since this evening.  No recent sick contacts.  She denies blood in her vomit.  No blood in her stool.  Denies fevers or chills.  Reports left-sided abdominal cramping. Symptoms are mild in severity.  Nothing worsens or improves her discomfort   Past Medical History  Diagnosis Date  . Hypertension   . Diabetes mellitus   . Diabetic neuropathy   . Depression   . Post traumatic stress disorder (PTSD)   . Acid reflux   . Sleep apnea   . High cholesterol    Past Surgical History  Procedure Laterality Date  . Abdominal hysterectomy    . Cholecystectomy    . Tonsillectomy  04/02/2012    Procedure: TONSILLECTOMY;  Surgeon: Ascencion Dike, MD;  Location: [facility] OR;  Service: ENT;  Laterality: Bilateral;   Family History  Problem Relation Age of Onset  . Heart disease    . Cancer    . Diabetes     History  Substance Use Topics  . Smoking status: Never Smoker   . Smokeless tobacco: Not on file  . Alcohol Use: No   OB History    No data available     Review of Systems  All other systems reviewed and are negative.     Allergies  Adhesive  Home Medications   Prior to Admission medications   Medication Sig Start Date End Date Taking? Authorizing Provider  FLUoxetine (PROZAC) 20 MG tablet Take 20 mg by mouth daily.   Yes Historical Provider, MD  insulin regular (NOVOLIN R,HUMULIN R) 100 units/mL injection Inject 20 Units into the skin 3 (three) times daily before meals.   Yes Historical Provider, MD  BD INSULIN SYRINGE ULTRAFINE 31G X 5/16" 1 ML MISC  07/06/13   Historical Provider, MD  gabapentin (NEURONTIN) 300 MG capsule Take 600 mg by mouth 3 (three) times daily.     Historical Provider, MD  glucose  blood (ONE TOUCH ULTRA TEST) test strip 3 times per day 12/29/13   Elayne Snare, MD  Insulin Pen Needle (BD PEN NEEDLE NANO U/F) 32G X 4 MM MISC 1 each by Does not apply route 4 (four) times daily. 08/12/13   Elayne Snare, MD  nebivolol (BYSTOLIC) 5 MG tablet Take 5 mg by mouth daily.      Historical Provider, MD  ondansetron (ZOFRAN ODT) 8 MG disintegrating tablet Take 1 tablet (8 mg total) by mouth every 8 (eight) hours as needed for nausea or vomiting. 12/21/14   Hoy Morn, MD  ranitidine (ZANTAC) 75 MG tablet Take 75 mg by mouth at bedtime.    Historical Provider, MD  simvastatin (ZOCOR) 40 MG tablet Take 1 tablet (40 mg total) by mouth every evening. 09/22/13   Elayne Snare, MD  trazodone (DESYREL) 300 MG tablet Take 300 mg by mouth at bedtime.      Historical Provider, MD  vitamin B-12 (CYANOCOBALAMIN) 500 MCG tablet Take 500 mcg by mouth daily.      Historical Provider, MD  Vitamin D, Ergocalciferol, (DRISDOL) 50000 UNITS CAPS Take 50,000 Units by mouth every 7 (seven) days. Take on monday    Historical Provider, MD   BP 147/82  mmHg  Pulse 94  Temp(Src) 98.5 F (36.9 C) (Oral)  Resp 24  Ht $R'5\' 5"'qQ$  (1.651 m)  Wt 280 lb (127.007 kg)  BMI 46.59 kg/m2  SpO2 97% Physical Exam  Constitutional: She is oriented to person, place, and time. She appears well-developed and well-nourished. No distress.  HENT:  Head: Normocephalic and atraumatic.  Eyes: EOM are normal.  Neck: Normal range of motion.  Cardiovascular: Normal rate, regular rhythm and normal heart sounds.   Pulmonary/Chest: Effort normal and breath sounds normal.  Abdominal: Soft. She exhibits no distension. There is no tenderness.  Musculoskeletal: Normal range of motion.  Neurological: She is alert and oriented to person, place, and time.  Skin: Skin is warm and dry.  Psychiatric: She has a normal mood and affect. Judgment normal.  Nursing note and vitals reviewed.   ED Course  Procedures (including critical care time) Labs  Review Labs Reviewed  CBC WITH DIFFERENTIAL - Abnormal; Notable for the following:    Neutrophils Relative % 88 (*)    Lymphocytes Relative 6 (*)    Lymphs Abs 0.4 (*)    All other components within normal limits  BASIC METABOLIC PANEL - Abnormal; Notable for the following:    Sodium 134 (*)    Glucose, Bld 203 (*)    All other components within normal limits  LIPASE, BLOOD    Imaging Review No results found.   EKG Interpretation None      MDM   Final diagnoses:  Nausea vomiting and diarrhea  Abdominal pain, unspecified abdominal location    4:19 AM Patient feels much better after IV fluids, antiemetics, pain medicine.  Suspect viral process.  Doubt intra-abdominal pathology.  Discharge home in good condition.  She understands to return to the ER for new or worsening symptoms.    Hoy Morn, MD 12/21/14 [phone]

## 2014-12-21 NOTE — ED Notes (Signed)
Pt c/o abdominal pain with n/v that started 6 hours ago

## 2014-12-21 NOTE — ED Notes (Signed)
Pt alert & oriented x4, stable gait. Patient  given discharge instructions, paperwork & prescription(s). Patient  instructed to stop at the registration desk to finish any additional paperwork. Patient verbalized understanding. Pt left department by wheelchair w/ no further questions.

## 2015-06-10 ENCOUNTER — Other Ambulatory Visit: Payer: Self-pay | Admitting: Pulmonary Disease

## 2015-06-10 DIAGNOSIS — M545 Low back pain: Secondary | ICD-10-CM

## 2015-06-26 ENCOUNTER — Ambulatory Visit
Admission: RE | Admit: 2015-06-26 | Discharge: 2015-06-26 | Disposition: A | Discharge status: Home / Self Care | Payer: Commercial Managed Care - HMO | Source: Ambulatory Visit | Attending: Pulmonary Disease | Admitting: Pulmonary Disease

## 2015-06-26 DIAGNOSIS — M545 Low back pain: Secondary | ICD-10-CM

## 2015-06-29 ENCOUNTER — Other Ambulatory Visit: Payer: Self-pay | Admitting: Pulmonary Disease

## 2015-06-29 DIAGNOSIS — M545 Low back pain, unspecified: Secondary | ICD-10-CM

## 2015-06-29 DIAGNOSIS — G8929 Other chronic pain: Secondary | ICD-10-CM

## 2015-07-05 ENCOUNTER — Ambulatory Visit
Admission: RE | Admit: 2015-07-05 | Discharge: 2015-07-05 | Disposition: A | Discharge status: Home / Self Care | Payer: Commercial Managed Care - HMO | Source: Ambulatory Visit | Attending: Pulmonary Disease | Admitting: Pulmonary Disease

## 2015-07-05 DIAGNOSIS — M545 Low back pain, unspecified: Secondary | ICD-10-CM

## 2015-07-05 DIAGNOSIS — G8929 Other chronic pain: Secondary | ICD-10-CM

## 2015-07-05 MED ORDER — METHYLPREDNISOLONE ACETATE 40 MG/ML INJ SUSP (RADIOLOG
120.0000 mg | Freq: Once | INTRAMUSCULAR | Status: AC
  Administered 2015-07-05: 120 mg via EPIDURAL

## 2015-07-05 MED ORDER — IOHEXOL 180 MG/ML  SOLN
1.0000 mL | Freq: Once | INTRAMUSCULAR | Status: AC | PRN
Start: 2015-07-05 — End: 2015-07-05
  Administered 2015-07-05: 1 mL via EPIDURAL

## 2015-07-05 NOTE — Discharge Instructions (Signed)

## 2015-07-28 ENCOUNTER — Emergency Department (HOSPITAL_COMMUNITY): Payer: Commercial Managed Care - HMO

## 2015-07-28 ENCOUNTER — Encounter (HOSPITAL_COMMUNITY): Payer: Self-pay | Admitting: Emergency Medicine

## 2015-07-28 ENCOUNTER — Emergency Department (HOSPITAL_COMMUNITY)
Admission: EM | Admit: 2015-07-28 | Discharge: 2015-07-28 | Disposition: A | Discharge status: Home / Self Care | Payer: Commercial Managed Care - HMO | Attending: Emergency Medicine | Admitting: Emergency Medicine

## 2015-07-28 DIAGNOSIS — M1990 Unspecified osteoarthritis, unspecified site: Secondary | ICD-10-CM | POA: Insufficient documentation

## 2015-07-28 DIAGNOSIS — I10 Essential (primary) hypertension: Secondary | ICD-10-CM | POA: Insufficient documentation

## 2015-07-28 DIAGNOSIS — Y998 Other external cause status: Secondary | ICD-10-CM | POA: Insufficient documentation

## 2015-07-28 DIAGNOSIS — E1140 Type 2 diabetes mellitus with diabetic neuropathy, unspecified: Secondary | ICD-10-CM | POA: Diagnosis not present

## 2015-07-28 DIAGNOSIS — Y9289 Other specified places as the place of occurrence of the external cause: Secondary | ICD-10-CM | POA: Insufficient documentation

## 2015-07-28 DIAGNOSIS — K219 Gastro-esophageal reflux disease without esophagitis: Secondary | ICD-10-CM | POA: Diagnosis not present

## 2015-07-28 DIAGNOSIS — E780 Pure hypercholesterolemia: Secondary | ICD-10-CM | POA: Insufficient documentation

## 2015-07-28 DIAGNOSIS — S99922A Unspecified injury of left foot, initial encounter: Secondary | ICD-10-CM | POA: Diagnosis present

## 2015-07-28 DIAGNOSIS — F329 Major depressive disorder, single episode, unspecified: Secondary | ICD-10-CM | POA: Diagnosis not present

## 2015-07-28 DIAGNOSIS — S9032XA Contusion of left foot, initial encounter: Secondary | ICD-10-CM | POA: Insufficient documentation

## 2015-07-28 DIAGNOSIS — W010XXA Fall on same level from slipping, tripping and stumbling without subsequent striking against object, initial encounter: Secondary | ICD-10-CM | POA: Diagnosis not present

## 2015-07-28 DIAGNOSIS — Y9389 Activity, other specified: Secondary | ICD-10-CM | POA: Insufficient documentation

## 2015-07-28 DIAGNOSIS — Z794 Long term (current) use of insulin: Secondary | ICD-10-CM | POA: Insufficient documentation

## 2015-07-28 DIAGNOSIS — Z8669 Personal history of other diseases of the nervous system and sense organs: Secondary | ICD-10-CM | POA: Diagnosis not present

## 2015-07-28 DIAGNOSIS — Z79899 Other long term (current) drug therapy: Secondary | ICD-10-CM | POA: Insufficient documentation

## 2015-07-28 HISTORY — DX: Other intervertebral disc degeneration, lumbar region: M51.36

## 2015-07-28 HISTORY — DX: Other intervertebral disc displacement, lumbar region: M51.26

## 2015-07-28 HISTORY — DX: Other intervertebral disc degeneration, lumbar region without mention of lumbar back pain or lower extremity pain: M51.369

## 2015-07-28 HISTORY — DX: Unspecified osteoarthritis, unspecified site: M19.90

## 2015-07-28 MED ORDER — HYDROCODONE-ACETAMINOPHEN 5-325 MG PO TABS: 1.0000 | ORAL_TABLET | ORAL | Status: DC | PRN

## 2015-07-28 MED ORDER — KETOROLAC TROMETHAMINE 10 MG PO TABS
10.0000 mg | ORAL_TABLET | Freq: Once | ORAL | Status: AC
  Administered 2015-07-28: 10 mg via ORAL
  Filled 2015-07-28: qty 1

## 2015-07-28 MED ORDER — ACETAMINOPHEN 325 MG PO TABS
650.0000 mg | ORAL_TABLET | Freq: Once | ORAL | Status: AC
  Administered 2015-07-28: 650 mg via ORAL
  Filled 2015-07-28: qty 2

## 2015-07-28 NOTE — ED Notes (Signed)
Pt reported she stepped in a mud hole yesterday and fell.  C/O pain to left foot.  Swelling noted across the top of foot.  Pt can wiggle toes, skin warm and dry.

## 2015-07-28 NOTE — ED Notes (Signed)
Pt states that she tripped and fell last night and injured top of left foot.

## 2015-07-28 NOTE — Discharge Instructions (Signed)
Your x-ray is negative for fracture or dislocation. Please see Dr. Luan Pulling in the office as soon as possible for reevaluation due to your diabetic status. Please apply ice over the next 2 or 3 days. Please keep your foot elevated above your waist. Use crutches when up and about until you can safely apply weight. Use Tylenol Extra Strength every 4 hours. May use Norco for more severe pain. Norco may cause drowsiness, please use with caution. Foot Contusion  A foot contusion is a deep bruise to the foot. Contusions happen when an injury causes bleeding under the skin. Signs of bruising include pain, puffiness (swelling), and discolored skin. The contusion may turn blue, purple, or yellow. HOME CARE  Put ice on the injured area.  Put ice in a plastic bag.  Place a towel between your skin and the bag.  Leave the ice on for 15-20 minutes, 03-04 times a day.  Only take medicines as told by your doctor.  Use an elastic wrap only as told. You may remove the wrap for sleeping, showering, and bathing. Take the wrap off if you lose feeling (numb) in your toes, or they turn blue or cold. Put the wrap on more loosely.  Keep the foot raised (elevated) with pillows.  If your foot hurts, avoid standing or walking.  When your doctor says it is okay to use your foot, start using it slowly. If you have pain, lessen how much you use your foot.  See your doctor as told. GET HELP RIGHT AWAY IF:   You have more redness, puffiness, or pain in your foot.  Your puffiness or pain does not get better with medicine.  You lose feeling in your foot, or you cannot move your toes.  Your foot turns cold or blue.  You have pain when you move your toes.  Your foot feels warm.  Your contusion does not get better in 2 days. MAKE SURE YOU:   Understand these instructions.  Will watch this condition.  Will get help right away if you or your child is not doing well or gets worse. Document Released: 09/04/2008  Document Revised: 05/27/2012 Document Reviewed: 10/30/2011 [facility] Patient Information 2015 [place], [place]. This information is not intended to replace advice given to you by your health care provider. Make sure you discuss any questions you have with your health care provider.

## 2015-07-28 NOTE — ED Provider Notes (Signed)
CSN: [id number]     Arrival date & time 07/28/15  1000 History   First MD Initiated Contact with Patient 07/28/15 1017     Chief Complaint  Patient presents with  . Foot Injury     (Consider location/radiation/quality/duration/timing/severity/associated sxs/prior Treatment) Patient is a 54 y.o. female presenting with foot injury. The history is provided by the patient.  Foot Injury Location:  Foot Time since incident:  1 day Foot location:  L foot Pain details:    Quality:  Aching   Severity:  Moderate   Onset quality:  Sudden   Duration:  1 day   Timing:  Intermittent   Progression:  Unchanged Chronicity:  New Dislocation: no   Prior injury to area:  No Relieved by:  Nothing Worsened by:  Bearing weight Ineffective treatments:  Rest Associated symptoms: back pain and swelling   Associated symptoms: no numbness and no tingling   Risk factors: no known bone disorder     Past Medical History  Diagnosis Date  . Hypertension   . Diabetes mellitus   . Diabetic neuropathy   . Depression   . Post traumatic stress disorder (PTSD)   . Acid reflux   . Sleep apnea   . High cholesterol   . Bulging lumbar disc   . Arthritis    Past Surgical History  Procedure Laterality Date  . Abdominal hysterectomy    . Cholecystectomy    . Tonsillectomy  04/02/2012    Procedure: TONSILLECTOMY;  Surgeon: Darletta Moll, MD;  Location: [facility] OR;  Service: ENT;  Laterality: Bilateral;   Family History  Problem Relation Age of Onset  . Heart disease    . Cancer    . Diabetes     Social History  Substance Use Topics  . Smoking status: Never Smoker   . Smokeless tobacco: None  . Alcohol Use: No   OB History    No data available     Review of Systems  Musculoskeletal: Positive for back pain and arthralgias.  Psychiatric/Behavioral:       Depression  All other systems reviewed and are negative.     Allergies  Adhesive  Home Medications   Prior to Admission medications     Medication Sig Start Date End Date Taking? Authorizing Provider  BD INSULIN SYRINGE ULTRAFINE 31G X 5/16" 1 ML MISC  07/06/13  Yes Historical Provider, MD  FLUoxetine (PROZAC) 20 MG tablet Take 20 mg by mouth daily.   Yes Historical Provider, MD  gabapentin (NEURONTIN) 300 MG capsule Take 600 mg by mouth 3 (three) times daily.    Yes Historical Provider, MD  glimepiride (AMARYL) 4 MG tablet Take 4 mg by mouth daily with breakfast.   Yes Historical Provider, MD  glucose blood (ONE TOUCH ULTRA TEST) test strip 3 times per day 12/29/13  Yes Reather Littler, MD  HYDROcodone-acetaminophen (NORCO) 10-325 MG per tablet Take 1 tablet by mouth every 6 (six) hours as needed.   Yes Historical Provider, MD  insulin aspart protamine - aspart (NOVOLOG 70/30 MIX) (70-30) 100 UNIT/ML FlexPen Inject into the skin.   Yes Historical Provider, MD  insulin detemir (LEVEMIR) 100 UNIT/ML injection Inject 70 Units into the skin.   Yes Historical Provider, MD  Insulin Pen Needle (BD PEN NEEDLE NANO U/F) 32G X 4 MM MISC 1 each by Does not apply route 4 (four) times daily. 08/12/13  Yes Reather Littler, MD  insulin regular (NOVOLIN R,HUMULIN R) 100 units/mL injection Inject 20 Units into  the skin 3 (three) times daily before meals.   Yes Historical Provider, MD  linagliptin (TRADJENTA) 5 MG TABS tablet Take 5 mg by mouth daily.   Yes Historical Provider, MD  nebivolol (BYSTOLIC) 5 MG tablet Take 5 mg by mouth daily.     Yes Historical Provider, MD  ranitidine (ZANTAC) 75 MG tablet Take 75 mg by mouth at bedtime.   Yes Historical Provider, MD  simvastatin (ZOCOR) 40 MG tablet Take 1 tablet (40 mg total) by mouth every evening. 09/22/13  Yes Elayne Snare, MD  trazodone (DESYREL) 300 MG tablet Take 300 mg by mouth at bedtime.     Yes Historical Provider, MD  valsartan-hydrochlorothiazide (DIOVAN-HCT) 160-25 MG per tablet Take 1 tablet by mouth daily.   Yes Historical Provider, MD  Cholecalciferol (VITAMIN D3) 5000 UNITS TABS Take 1 tablet by  mouth daily.    Historical Provider, MD   BP 106/50 mmHg  Pulse 71  Temp(Src) 97.5 F (36.4 C) (Oral)  Resp 16  Ht $R'5\' 5"'hb$  (1.651 m)  Wt 296 lb (134.265 kg)  BMI 49.26 kg/m2  SpO2 97% Physical Exam  Constitutional: She is oriented to person, place, and time. She appears well-developed and well-nourished.  Non-toxic appearance.  HENT:  Head: Normocephalic.  Right Ear: Tympanic membrane and external ear normal.  Left Ear: Tympanic membrane and external ear normal.  Eyes: EOM and lids are normal. Pupils are equal, round, and reactive to light.  Neck: Normal range of motion. Neck supple. Carotid bruit is not present.  Cardiovascular: Normal rate, regular rhythm, normal heart sounds, intact distal pulses and normal pulses.   Pulmonary/Chest: Breath sounds normal. No respiratory distress.  Abdominal: Soft. Bowel sounds are normal. There is no tenderness. There is no guarding.  Musculoskeletal: Normal range of motion.       Left hip: Normal.       Left knee: Normal.       Left foot: There is tenderness and swelling. There is normal capillary refill, no crepitus, no deformity and no laceration.       Feet:  Lymphadenopathy:       Head (right side): No submandibular adenopathy present.       Head (left side): No submandibular adenopathy present.    She has no cervical adenopathy.  Neurological: She is alert and oriented to person, place, and time. She has normal strength. No cranial nerve deficit or sensory deficit.  Skin: Skin is warm and dry.  Psychiatric: She has a normal mood and affect. Her speech is normal.  Nursing note and vitals reviewed.   ED Course  Procedures (including critical care time) Labs Review Labs Reviewed - No data to display  Imaging Review Dg Foot Complete Left  07/28/2015   CLINICAL DATA:  Fall. Foot pain and swelling. Injury last night. Initial encounter.  EXAM: LEFT FOOT - COMPLETE 3+ VIEW  COMPARISON:  None.  FINDINGS: There is no evidence of fracture or  dislocation. There is no evidence of arthropathy or other focal bone abnormality. Soft tissues are unremarkable.  IMPRESSION: Negative.   Electronically Signed   By: Dereck Ligas M.D.   On: 07/28/2015 10:32   I have personally reviewed and evaluated these images and lab results as part of my medical decision-making.   EKG Interpretation None      MDM  There no gross neurovascular changes of the left lower extremity. The x-ray of the left foot is negative for fracture, dislocation, or foreign body. The plan at  this time is for the patient to have an Ace wrap applied to the foot. The patient will be fitted with crutches as well as a postoperative shoe. I've advised the patient to use ice, and to keep the area elevated as much as possible. The patient is treated with 12 tablets of Norco, one to be used every 4 hours if needed for pain not improved by extra strength Tylenol. Patient will follow with her primary physician next week.    Final diagnoses:  None    **I have reviewed nursing notes, vital signs, and all appropriate lab and imaging results for this patient.Lily Kocher, PA-C 07/28/15 1109  Nat Christen, MD 07/28/15 [phone]

## 2015-08-02 ENCOUNTER — Other Ambulatory Visit (HOSPITAL_COMMUNITY): Payer: Self-pay | Admitting: Pulmonary Disease

## 2015-08-02 DIAGNOSIS — Z1231 Encounter for screening mammogram for malignant neoplasm of breast: Secondary | ICD-10-CM

## 2015-09-01 ENCOUNTER — Ambulatory Visit (HOSPITAL_COMMUNITY)
Admission: RE | Admit: 2015-09-01 | Discharge: 2015-09-01 | Disposition: A | Discharge status: Home / Self Care | Payer: Commercial Managed Care - HMO | Source: Ambulatory Visit | Attending: Pulmonary Disease | Admitting: Pulmonary Disease

## 2015-09-01 ENCOUNTER — Other Ambulatory Visit (HOSPITAL_COMMUNITY): Payer: Self-pay | Admitting: Pulmonary Disease

## 2015-09-01 DIAGNOSIS — M545 Low back pain: Secondary | ICD-10-CM | POA: Diagnosis not present

## 2015-09-01 DIAGNOSIS — M25552 Pain in left hip: Secondary | ICD-10-CM | POA: Diagnosis not present

## 2015-09-01 DIAGNOSIS — M79672 Pain in left foot: Secondary | ICD-10-CM | POA: Diagnosis not present

## 2015-09-01 DIAGNOSIS — M5442 Lumbago with sciatica, left side: Secondary | ICD-10-CM

## 2015-09-01 DIAGNOSIS — M25551 Pain in right hip: Secondary | ICD-10-CM | POA: Insufficient documentation

## 2015-09-01 DIAGNOSIS — M7732 Calcaneal spur, left foot: Secondary | ICD-10-CM | POA: Diagnosis not present

## 2015-09-02 ENCOUNTER — Ambulatory Visit (HOSPITAL_COMMUNITY)
Admission: RE | Admit: 2015-09-02 | Discharge: 2015-09-02 | Disposition: A | Discharge status: Home / Self Care | Payer: Commercial Managed Care - HMO | Source: Ambulatory Visit | Attending: Pulmonary Disease | Admitting: Pulmonary Disease

## 2015-09-02 DIAGNOSIS — Z1231 Encounter for screening mammogram for malignant neoplasm of breast: Secondary | ICD-10-CM | POA: Insufficient documentation

## 2016-01-03 ENCOUNTER — Ambulatory Visit: Payer: Commercial Managed Care - HMO | Admitting: Orthopedic Surgery

## 2016-01-06 ENCOUNTER — Other Ambulatory Visit (HOSPITAL_COMMUNITY): Payer: Self-pay | Admitting: Respiratory Therapy

## 2016-01-06 DIAGNOSIS — G4730 Sleep apnea, unspecified: Secondary | ICD-10-CM

## 2016-01-31 ENCOUNTER — Other Ambulatory Visit (HOSPITAL_COMMUNITY)
Admission: RE | Admit: 2016-01-31 | Discharge: 2016-01-31 | Disposition: A | Discharge status: Home / Self Care | Payer: Commercial Managed Care - HMO | Source: Ambulatory Visit | Attending: Pulmonary Disease | Admitting: Pulmonary Disease

## 2016-01-31 DIAGNOSIS — I10 Essential (primary) hypertension: Secondary | ICD-10-CM | POA: Insufficient documentation

## 2016-01-31 DIAGNOSIS — E119 Type 2 diabetes mellitus without complications: Secondary | ICD-10-CM | POA: Diagnosis not present

## 2016-01-31 DIAGNOSIS — E669 Obesity, unspecified: Secondary | ICD-10-CM | POA: Insufficient documentation

## 2016-01-31 LAB — TSH: TSH: 1.225 u[IU]/mL (ref 0.350–4.500)

## 2016-01-31 LAB — LIPID PANEL
CHOLESTEROL: 181 mg/dL (ref 0–200)
HDL: 49 mg/dL (ref >40)
LDL CALC: 109 mg/dL — AB (ref 0–99)
TRIGLYCERIDES: 113 mg/dL (ref <150)
Total CHOL/HDL Ratio: 3.7 RATIO
VLDL: 23 mg/dL (ref 0–40)

## 2016-01-31 LAB — CBC WITH DIFFERENTIAL/PLATELET
BASOS PCT: 1 %
Basophils Absolute: 0 10*3/uL (ref 0.0–0.1)
Eosinophils Absolute: 0.2 10*3/uL (ref 0.0–0.7)
Eosinophils Relative: 4 %
HEMATOCRIT: 37.4 % (ref 36.0–46.0)
Hemoglobin: 12.1 g/dL (ref 12.0–15.0)
LYMPHS ABS: 2 10*3/uL (ref 0.7–4.0)
Lymphocytes Relative: 46 %
MCH: 25.6 pg — AB (ref 26.0–34.0)
MCHC: 32.4 g/dL (ref 30.0–36.0)
MCV: 79.1 fL (ref 78.0–100.0)
MONO ABS: 0.3 10*3/uL (ref 0.1–1.0)
MONOS PCT: 8 %
NEUTROS ABS: 1.8 10*3/uL (ref 1.7–7.7)
Neutrophils Relative %: 41 %
Platelets: 224 10*3/uL (ref 150–400)
RBC: 4.73 MIL/uL (ref 3.87–5.11)
RDW: 15.2 % (ref 11.5–15.5)
WBC: 4.3 10*3/uL (ref 4.0–10.5)

## 2016-01-31 LAB — COMPREHENSIVE METABOLIC PANEL
ALBUMIN: 3.8 g/dL (ref 3.5–5.0)
ALK PHOS: 66 U/L (ref 38–126)
ALT: 25 U/L (ref 14–54)
ANION GAP: 6 (ref 5–15)
AST: 24 U/L (ref 15–41)
BILIRUBIN TOTAL: 0.5 mg/dL (ref 0.3–1.2)
BUN: 14 mg/dL (ref 6–20)
CALCIUM: 8.7 mg/dL — AB (ref 8.9–10.3)
CO2: 27 mmol/L (ref 22–32)
Chloride: 105 mmol/L (ref 101–111)
Creatinine, Ser: 0.65 mg/dL (ref 0.44–1.00)
Glucose, Bld: 209 mg/dL — ABNORMAL HIGH (ref 65–99)
POTASSIUM: 4 mmol/L (ref 3.5–5.1)
Sodium: 138 mmol/L (ref 135–145)
TOTAL PROTEIN: 7.3 g/dL (ref 6.5–8.1)

## 2016-02-01 LAB — HEMOGLOBIN A1C
HEMOGLOBIN A1C: 8.5 % — AB (ref 4.8–5.6)
Mean Plasma Glucose: 197 mg/dL

## 2016-02-15 ENCOUNTER — Ambulatory Visit: Payer: Commercial Managed Care - HMO | Admitting: Orthopedic Surgery

## 2016-06-01 ENCOUNTER — Other Ambulatory Visit: Payer: Self-pay | Admitting: Pharmacist

## 2016-06-01 MED FILL — HUMULIN R 500 UNITS/ML VIAL: 500 | 30 days supply | Qty: 20 | Fill #0

## 2016-06-01 NOTE — Patient Outreach (Addendum)
Receive a phone call from Yasmin with Dr. Elyse Hsu at [facility]. States that Dr. Elyse Hsu has prescribed Humulin R U-500 for the patient, but the patient has not yet been able to obtain this medication due to cost. Reports that the medication is considered a specialty medication through the patient's insurance and that this copay is not affordable to the patient. Yasmin states that she has tried to obtain patient assistance through [employer] for Ms. Gonzalez, but the patient does not yet meet the out-of-pocket expense for this year. Reports that the office has also applied for a tier exception through the patient's insurance, but that this application was denied. Reports that the patient requires 300 units of insulin/day and that her most recent A1C was 14.7%.   Hotel manager. Patient qualifies for a 1 month pharmacy emergency supply based on approval received from Pharmacy Manager due to financial need. Prescription is called in by Dr. Darryl Nestle office to [place]. Call [place] and let Jinny Blossom know that the prescription has been approved for this emergency fund.  Speak with Ms. Feldmeier who is currently in the office for her appointment. Ms. Alia reports that she has applied for the extra help through social security, but has been denied. Reports that she has already spent about $400 out-of-pocket this year. Let patient know that she can pick up this one month supply from the [facility] and provide her with the pharmacy's address. Ms. Gertz reports that she has no medication questions for me at this time.  Yasmin states that she will help the patient to complete the patient assistance application, as the out-of-pocket requirement should now be met or close to being met.   Ask Yasmin to call me with any future medication concerns/questions for this patient. Will close pharmacy episode at this  time.  Harlow Asa, PharmD Clinical Pharmacist [place] Management [phone]

## 2016-08-07 ENCOUNTER — Other Ambulatory Visit (HOSPITAL_COMMUNITY): Payer: Self-pay | Admitting: Pulmonary Disease

## 2016-08-07 DIAGNOSIS — Z1231 Encounter for screening mammogram for malignant neoplasm of breast: Secondary | ICD-10-CM

## 2016-08-31 ENCOUNTER — Telehealth: Payer: Self-pay | Admitting: *Deleted

## 2016-08-31 NOTE — Patient Outreach (Signed)
[facility]) Care Management  08/31/2016  CHASSIDY LAYSON 10/17/1961 [id number]  EMM- Prevent referral:  Telephone call to patient; left HIPPa compliant voice mail requesting return call.  Plan: Will follow up.  Sherrin Daisy, RN BSN [place] Management Coordinator [facility] Care Management  [phone]

## 2016-09-03 ENCOUNTER — Ambulatory Visit (HOSPITAL_COMMUNITY)
Admission: RE | Admit: 2016-09-03 | Discharge: 2016-09-03 | Disposition: A | Discharge status: Home / Self Care | Payer: Commercial Managed Care - HMO | Source: Ambulatory Visit | Attending: Pulmonary Disease | Admitting: Pulmonary Disease

## 2016-09-03 DIAGNOSIS — Z1231 Encounter for screening mammogram for malignant neoplasm of breast: Secondary | ICD-10-CM | POA: Insufficient documentation

## 2016-09-04 ENCOUNTER — Other Ambulatory Visit: Payer: Self-pay | Admitting: *Deleted

## 2016-09-04 NOTE — Patient Outreach (Signed)
[facility]) Care Management  09/04/2016  Hannah Schultz 02-12-1961 [id number]  EMMI-Prevent Referral:  Telephone call to patient who was advised of reason for call & of [place] Management services. HIPPA verification received from patient.   Patient voices that major health concern is that Diabetes is not controlled and that she is having difficulty obtaining medications.  States " A1c level was 15 several months ago".  States she experienced vision problem in both eyes. States she is seeing another endocrinologist and has had some medication changes. Voices that blood sugar before eating breakfast was 220 at noon today.  States checking blood sugar three times daily before meals.   States she started on new diabetes medication today-Triseba injectable and Novolin sliding scale insulin. Patient voices that she is also taking Glimepiride and Januvia. States she is in donut hole & currently has 3 pills left and unable afford to get more. States she plans to check with MD office to see if samples are available. States using mail order & local pharmacy to fill prescriptions.  Patient voices that she has transportation to MD appointments & next appointment with specialist is in Oct.  Patient is requesting [facility] care management services for medication assistance and help with getting Diabetes under control.  Plan: Refer to care management assistant  to assign [facility] Coordinator for complex case management & assign Pharmacist for medication assistance.    Sherrin Daisy, RN BSN [place] Management Coordinator [facility] Care Management  [phone]

## 2016-09-07 ENCOUNTER — Other Ambulatory Visit: Payer: Self-pay | Admitting: *Deleted

## 2016-09-07 NOTE — Patient Outreach (Signed)
[facility] [facility]) Care Management  09/07/2016  Hannah Schultz 1961-01-12 [id number]   Unable to reach Ms Rainone by phone today.   Plan: I will call Ms. Highbaugh again next week to follow up on diabetes management needs.    [facility] Management  [phone]

## 2016-09-10 ENCOUNTER — Encounter: Payer: Self-pay | Admitting: *Deleted

## 2016-09-10 ENCOUNTER — Other Ambulatory Visit: Payer: Self-pay | Admitting: *Deleted

## 2016-09-10 NOTE — Patient Outreach (Signed)
[facility]) Care Management  09/10/2016  Hannah Schultz 18-Sep-1961 [id number]  Mrs. Hannah Schultz is a 55 year old female with medical history of Diabetes Mellitus, Type 2, hyperlipidemia, and hypertension. She was referred to [facility] by her endocrinologist for assistance with medication management and program assistance. She is in the donut hole and has required medication assistance from our team. [place] team has been working closely with [name] and referred her to the nursing team for care coordination and assistance with disease management and education.   [name]'s HGA1C was 8.5 on 01/31/16. She has recently experienced vision problems and has noted higher than usual fasting cbg's (200's). She checks her cbg TID and is currently on a regimen including  Triseba injectable and Novolin sliding scale insulin. In addition, [name] is taking Glimepiride and Januvia.   When we spoke by phone today, [name] agreed to an in person visit at her home but is not available until Tuesday, September 18, 2016 @ 12n.   Plan: I will see [name] at home next week.    [place] Management  [phone]

## 2016-09-18 ENCOUNTER — Encounter: Payer: Self-pay | Admitting: *Deleted

## 2016-09-18 ENCOUNTER — Other Ambulatory Visit: Payer: Self-pay | Admitting: *Deleted

## 2016-09-18 NOTE — Patient Outreach (Signed)
[facility]) Care Management   09/18/2016  AIREN DALES 01-06-1961 [id number]  CHARAE DEPAOLIS is an 55 y.o. female with medical history of Diabetes Mellitus, Type 2, hyperlipidemia, and hypertension. She was referred to [facility] by her endocrinologist for assistance with medication management and program assistance. She is in the donut hole and has required medication assistance from our team. [place] team has been working closely with Mrs. Kouns and referred her to the nursing team for care coordination and assistance with disease management and education.   Mrs. Omar's HGA1C was 8.5 on 01/31/16. It was up to 14 on her check 3 months ago. She is scheduled for another check on Monday when she sees Dr. Chalmers Cater.  She has recently experienced vision problems and has noted higher than usual fasting cbg's (200's). She checks her cbg TID and is currently on a regimen including Triseba injectable and Novolin sliding scale insulin. In addition, Mrs. Everitt is taking Glimepiride and Januvia.   Subjective: "I've been working on it. I've got to get this under control."  Objective:    Review of Systems  Constitutional: Negative.   HENT: Negative.   Eyes: Positive for blurred vision.  Respiratory: Negative.   Cardiovascular: Negative for chest pain and leg swelling.  Gastrointestinal: Positive for abdominal pain and nausea. Negative for blood in stool, constipation and diarrhea.       Notes mild nausea intermittently in the morning.   Noted mild intermittent stomach pains over the last 3 months.     Genitourinary: Negative.   Musculoskeletal: Positive for myalgias. Negative for falls.  Skin: Positive for rash.       Patient notes a "spotty" rash of her bilateral lower extremities that has been going on for approximately 3 months  Neurological: Negative.   Psychiatric/Behavioral: Negative.     Physical Exam  Constitutional: Vital signs are normal. She  appears well-developed and well-nourished. She is active.  Skin: Skin is warm and dry.     Psychiatric: She has a normal mood and affect. Her speech is normal and behavior is normal. Judgment and thought content normal. Cognition and memory are normal.    Encounter Medications:   Outpatient Encounter Prescriptions as of 09/18/2016  Medication Sig Note  . BD INSULIN SYRINGE ULTRAFINE 31G X 5/16" 1 ML MISC    . Cholecalciferol (VITAMIN D3) 5000 UNITS TABS Take 1 tablet by mouth daily.   Marland Kitchen FLUoxetine (PROZAC) 20 MG tablet Take 20 mg by mouth daily.   Marland Kitchen gabapentin (NEURONTIN) 300 MG capsule Take 600 mg by mouth 3 (three) times daily.    Marland Kitchen glimepiride (AMARYL) 4 MG tablet Take 4 mg by mouth daily with breakfast.   . glucose blood (ONE TOUCH ULTRA TEST) test strip 3 times per day   . HYDROcodone-acetaminophen (NORCO/VICODIN) 5-325 MG per tablet Take 1 tablet by mouth every 4 (four) hours as needed.   . insulin aspart protamine - aspart (NOVOLOG 70/30 MIX) (70-30) 100 UNIT/ML FlexPen Inject into the skin. 07/05/2015: Received from: [facility]  . insulin detemir (LEVEMIR) 100 UNIT/ML injection Inject 70 Units into the skin. 07/05/2015: Received from: [facility]  . Insulin Pen Needle (BD PEN NEEDLE NANO U/F) 32G X 4 MM MISC 1 each by Does not apply route 4 (four) times daily.   . insulin regular (NOVOLIN R,HUMULIN R) 100 units/mL injection Inject 20 Units into the skin 3 (three) times daily before meals.   Marland Kitchen linagliptin (TRADJENTA) 5 MG TABS  tablet Take 5 mg by mouth daily.   . nebivolol (BYSTOLIC) 5 MG tablet Take 5 mg by mouth daily.     . ranitidine (ZANTAC) 75 MG tablet Take 75 mg by mouth at bedtime.   . simvastatin (ZOCOR) 40 MG tablet Take 1 tablet (40 mg total) by mouth every evening.   . trazodone (DESYREL) 300 MG tablet Take 300 mg by mouth at bedtime.     . valsartan-hydrochlorothiazide (DIOVAN-HCT) 160-25 MG per tablet Take 1 tablet by mouth daily.    Assessment:  55 year old  female living in [place] with uncontrolled diabetes, contributing factors include: financial constraints, medication assistance needs, and knowledge deficits.   Medication Management and Assistance - Mrs. Pflaum admittedly has not taken her medications exactly as prescribed but has been taking her diabetes medications and insulin. Mrs. Cimmino says her out of pocket cost for medications is currently >$300. I referred her to our pharmacy team for assistance as per her request.   Chronic Health Condition (DM) - Mrs. Marchitto has more recently been checking her CBG's TID and her average readings are now in the 200's. She says that prior to changing to her new insulin and working with Dr. Chalmers Cater, her cbg's routinely were in the 300 range.   Mrs. Nawrot has not been following a carb modified diet. I provided printed Emmi educational materials today but she is interested in working with a dietician and asked if I would inquire with Dr. Chalmers Cater re: a referral.   Mrs. Drennon does not exercise because of feeling "Unsteady" with her left knee. She was provided with a hinged brace but doesn't wear it frequently. She is interested in possibly getting in the pool at the [facility] and riding the stationary bike there for exercise.   Plan:   Ms. Dike will take her medications as prescribed daily and will continue to work with our pharmacy team to obtain assistance for medication procurement needs.   Ms. Hallmark will check her cbg's daily as prescribed, calling her provider for findings outside established parameters.   Ms. Dorman will attend all scheduled provider appointments including her provider appointment with Dr. Chalmers Cater, endocrinology, on Monday.    I will see Ms. Marcussen at home in 2 weeks.   [facility] CM Care Plan Problem One   Flowsheet Row Most Recent Value  Care Plan Problem One  Knowledge Deficit related to self health management of Diabetes Mellitus, Type 2  Role Documenting the Problem One  Care Management  Coordinator  Care Plan for Problem One  Active  THN Long Term Goal (31-90 days)  Over the next 60 days, patient will verbalize understanding of plan of care for long term self health management of diabetes mellitus  THN Long Term Goal Start Date  09/10/16  Interventions for Problem One Long Term Goal  Discussed the importance of  established and documented long term plan of care for self health management of DM  [facility] CM Short Term Goal #1 (0-30 days)  Over the next 30 days, patient will check cbg's TID and record  [facility] CM Short Term Goal #1 Start Date  09/10/16  Interventions for Short Term Goal #1  Utilizing teachback method, reviewed rationale for cbg checks and ordered frequency  [facility]N CM Short Term Goal #2 (0-30 days)  Over the next 30 days, patient will take medications as prescribed  [facility] CM Short Term Goal #2 Start Date  09/10/16  Interventions for Short Term Goal #2  Medication review performed,  discussed patient interactions with pharmacy team  [facility] CM Short Term Goal #3 (0-30 days)  Over the next 30 days, patient will verbalize understanding of recommended cbg and HgA1C targets        [facility] Care Management  [phone]

## 2016-09-19 ENCOUNTER — Other Ambulatory Visit: Payer: Self-pay | Admitting: *Deleted

## 2016-09-19 NOTE — Patient Outreach (Signed)
[facility]) Care Management  09/19/2016  Hannah Schultz 05-16-1961 [id number]  Outreach to Dr. Almetta Lovely office per request of Hannah Schultz regarding referral to dietician/CDE. Dr. Almetta Lovely nurse will ask for approval on referral to the [place] (as per Hannah Schultz's request to be referred to a dietician in [place] rather than [place]) when Dr. Chalmers Cater is back in the office next week. I notified Hannah Schultz by phone of this plan.   Plan: I will see Hannah Schultz at home in 2 weeks. If Hannah Schultz has not heard from the Diabetes and [place] in 2 weeks, we will return a call to them requesting status update on referral. Hannah Schultz will reach out to Dr. Almetta Lovely office between now and my next visit with any questions or concerns regarding he diabetes care.     [place] Management  [phone]

## 2016-09-25 ENCOUNTER — Other Ambulatory Visit: Payer: Self-pay | Admitting: *Deleted

## 2016-09-25 ENCOUNTER — Encounter: Payer: Self-pay | Admitting: *Deleted

## 2016-09-25 NOTE — Patient Outreach (Signed)
[facility]) Care Management  09/25/2016  Hannah Schultz Mar 24, 1961 [id number]  Hannah Schultz is an 55 y.o. female with medical history of Diabetes Mellitus, Type 2, hyperlipidemia, and hypertension. She was referred to [place] Management by her endocrinologist for assistance with medication management and program assistance. She is in the donut hole and has required medication assistance from our team. [place] team has been working closely with [name] and referred her to the nursing team for care coordination and assistance with disease management and education.   [name]'s HGA1C was 8.5 on 01/31/16. It was up to 14 on her check 3 months ago. Yesterday, at her visit with Dr. Welford Roche was down to 8.0. She has recently experienced vision problems and has noted higher than usual fasting cbg's (200's). She checks her cbg TID and is currently on a regimen including Triseba injectable and Novolin sliding scale insulin. In addition, [name] is taking Glimepiride and Januvia.   [name] requested assistance with referral to dietician/CDE. I reached out to Dr. Almetta Lovely office last week to request assistance. Today, when I spoke with [name] she indicated that Dr. Chalmers Cater confirmed the order had been sent. [name] awaits a call from the Diabetes and [place].   Today, [name]'s fasting CBG was 134.   When we visited in the home last week, we spent a great deal of time on nutrition education. [name] tells me today that she continues to employ the new information she learned and that Dr. Chalmers Cater was very pleased with her new knowledge.    Plan: I will follow up with [name] next week at home on 10/03/16 @ 11:30 for a home visit.    [place] Management  [phone]

## 2016-10-03 ENCOUNTER — Other Ambulatory Visit: Payer: Self-pay | Admitting: *Deleted

## 2016-10-03 ENCOUNTER — Encounter: Payer: Self-pay | Admitting: *Deleted

## 2016-10-03 NOTE — Patient Outreach (Signed)
[facility]) Care Management   10/03/2016  Hannah Schultz 02/06/1961 [id number]  Hannah Schultz is an 55 y.o. female with medical history of Diabetes Mellitus, Type 2, hyperlipidemia, and hypertension. She was referred to [place] [facility] by her endocrinologist for assistance with medication management and program assistance. She is in the donut hole and has required medication assistance from our team. [place] team has been working closely with Hannah Schultz and referred her to the nursing team for care coordination and assistance with disease management and education.   Subjective: "I'm working really hard. It just seems like Schultz long way to where I want to be."  Objective:  BP 114/72   Pulse 66   SpO2 96%   Review of Systems  Constitutional: Negative.   HENT: Negative.   Eyes: Negative.   Respiratory: Negative.   Cardiovascular: Negative.   Gastrointestinal: Positive for heartburn.       Reports hearburn x 2 last week associated with eating pork  Genitourinary: Negative.   Musculoskeletal: Positive for myalgias. Negative for falls.  Skin:       No new changes; scattered areas of hyperpigmentation lower bilateral legs; patient states these have been present for several months  Neurological: Negative.   Psychiatric/Behavioral: Negative.     Physical Exam  Constitutional: She is oriented to person, place, and time. Vital signs are normal. She appears well-developed and well-nourished. She is active. She does not have Schultz sickly appearance. She does not appear ill.  Cardiovascular: Normal rate and regular rhythm.   Respiratory: Effort normal and breath sounds normal.  GI: Soft. Bowel sounds are normal.  Neurological: She is alert and oriented to person, place, and time.  Skin: Skin is warm, dry and intact.  Psychiatric: She has Schultz normal mood and affect. Her speech is normal and behavior is normal. Judgment and thought content normal. Cognition and memory are  normal.    Encounter Medications:   Outpatient Encounter Prescriptions as of 10/03/2016  Medication Sig Note  . BD INSULIN SYRINGE ULTRAFINE 31G X 5/16" 1 ML MISC    . Cholecalciferol (VITAMIN D3) 5000 UNITS TABS Take 1 tablet by mouth daily.   Marland Kitchen FLUoxetine (PROZAC) 20 MG tablet Take 20 mg by mouth daily.   Marland Kitchen gabapentin (NEURONTIN) 300 MG capsule Take 600 mg by mouth 3 (three) times daily.    Marland Kitchen glimepiride (AMARYL) 4 MG tablet Take 4 mg by mouth daily with breakfast. 09/18/2016: Just ran out; having refilled  . glucose blood (ONE TOUCH ULTRA TEST) test strip 3 times per day   . HYDROcodone-acetaminophen (NORCO/VICODIN) 5-325 MG per tablet Take 1 tablet by mouth every 4 (four) hours as needed. (Patient not taking: Reported on 09/18/2016)   . Insulin Aspart (NOVOLOG FLEXPEN [place]) Inject 10 Units into the skin 3 (three) times daily.   . insulin aspart protamine - aspart (NOVOLOG 70/30 MIX) (70-30) 100 UNIT/ML FlexPen Inject into the skin. 07/05/2015: Received from: [place]  . Insulin Degludec (TRESIBA FLEXTOUCH) 200 UNIT/ML SOPN Inject 50 Units into the skin once.   . insulin detemir (LEVEMIR) 100 UNIT/ML injection Inject 70 Units into the skin. 07/05/2015: Received from: [place]  . Insulin Pen Needle (BD PEN NEEDLE NANO U/F) 32G X 4 MM MISC 1 each by Does not apply route 4 (four) times daily.   . insulin regular (NOVOLIN R,HUMULIN R) 100 units/mL injection Inject 20 Units into the skin 3 (three) times daily before meals.   Marland Kitchen linagliptin (  TRADJENTA) 5 MG TABS tablet Take 5 mg by mouth daily. 09/18/2016: Changed to sitagliptin (Januvia); has samples  . nebivolol (BYSTOLIC) 5 MG tablet Take 5 mg by mouth daily.   09/18/2016: Last refill 04/18/2015 #30  . ranitidine (ZANTAC) 75 MG tablet Take 75 mg by mouth at bedtime.   . simvastatin (ZOCOR) 40 MG tablet Take 1 tablet (40 mg total) by mouth every evening.   . trazodone (DESYREL) 300 MG tablet Take 300 mg by mouth at bedtime.   09/18/2016:  "I'm trying to wean myself from that"  . valsartan-hydrochlorothiazide (DIOVAN-HCT) 160-25 MG per tablet Take 1 tablet by mouth daily.     Assessment:  55 year old female living in [place] with uncontrolled diabetes, contributing factors include: financial constraints, medication assistance needs, and knowledge deficits.   Medication Management and Assistance - when I first saw Hannah Schultz earlier in the month, she admitted she had not taken her medications exactly as prescribed but had been taking her diabetes medications and insulin. Hannah Schultz says her out of pocket cost for medications is currently >$300. I referred her to our pharmacy team for assistance as per her request. Today, she reports she has been working on better adherence to her prescribed regimen.  Chronic Health Condition (DM) - Hannah Schultz has more recently been checking her CBG's TID and her average readings are in the 200's. She says that prior to changing to her new insulin and working with Dr. Chalmers Cater, her cbg's routinely were in the 300 range. She is currently on Schultz regimen including Triseba injectable and Novolin sliding scale insulin. In addition, Hannah Schultz is taking Glimepiride and Januvia.   Upon reviewing her monitor readings and averages today, I see that Hannah Schultz has had 3 readings of < 150 and 3 readings of > 300. I asked about this and how she felt when she has readings < 150. She says she feels really bad and usually eats Schultz candy bar to address the symptoms. I reviewed with Hannah Schultz in detail and provided printed materials describing carb modified diet and what food are appropriate to eat when she feels symptoms related to lower blood glucose readings. She says today that she understands she may have been "over doing it" when she felt symptomatic with lower cbg's and will work towards adjusting her diet further. She is very anxious to start working with the dietician.   HGA1C:  01/31/16 = 8.5 06/25/16 = 14   09/24/16 = 8.0  Hannah Schultz was referred to the [facility] and [place]. She says she hasn't heard from them yet. I placed Schultz call to the [facility] and asked if they would reach out to Hannah Schultz to give her an idea of where she is in the que.   Acute/Chronic Health Condition (urticaria) - Hannah Schultz asks today what kind of soap is best for her to use. She has "dry, itchy" skin and can't seem to find anything over the counter to address the itching. I advised that also speak about this with Dr. Luan Pulling. She asked for Schultz dermatology referral. I passed this request along to Dr. Luan Pulling' office and updated Dr. Chalmers Cater via faxed assessment note.  Acute/Chronic Health Condition (dry mouth) - Hannah Schultz complains of "dry mouth" and says this is Schultz chronic problem. She asked for recommendations about over the counter treatment. I recommended that she speak with her dentist or Dr. Luan Pulling on their next visit. She says she has  used Biotene in the past but it doesn't really seem to help.   Mobility Concerns - Hannah Schultz has not been exercising because of feeling "Unsteady" with her left knee. She was provided with Schultz hinged brace but doesn't wear it frequently. She expressed interested in possibly getting in the pool at the [facility] when we last spoke and she says today she is considering it but still hasn't gone over to check out the facility and talk to someone about the pool.   I recommended that she follow up with whomever gave her to knee brace. She said she saw Dr. Lilia Argue (podiatry) when her ankle was hurting but wasn't sure she remembered who recommended the knee brace. She is going to look through her files and proceed accordingly.   Inquiry re: Bariatric Surgical procedures - Hannah Schultz asked my opinion about whether "Schultz band or bariatric surgery" would work for her. I advised that she discuss this with Dr. Luan Pulling and notified him of her inquiry.     Plan:   Hannah Schultz will take her medications as prescribed daily and will continue to work with our pharmacy team to obtain assistance for medication procurement needs.   Hannah Schultz will check her cbg's daily as prescribed, calling her provider for findings outside established parameters.   Ms. Ackerley will attend all scheduled provider appointments.

## 2016-10-11 ENCOUNTER — Other Ambulatory Visit: Payer: Self-pay | Admitting: Pharmacist

## 2016-10-11 NOTE — Patient Outreach (Addendum)
[facility]) Care Management  10/11/2016  Hannah Schultz 12-14-1960 [id number]   55 year old female referred to [place] for medication assistance. Per referral patient is in medicare coverage gap and is having difficulty affording her antidiabetic medications spending $300/month out of pocket for medications and is interested in seeing if she is eligible for any patient assistance programs or if she is maximizing her [employer] benefit.  Patient was previously referred to [place] in 05/2016 and someone at [facility] was to assist patient with medication assisstance application once out of pocket requirement was met.  Was unable to reach patient today via telephone and I have left a voicemail asking her to return my call.  Plan:  Will followup next week via telephone.  Bennye Alm, PharmD [facility] PGY2 Pharmacy Resident [phone]

## 2016-10-12 ENCOUNTER — Ambulatory Visit: Payer: Self-pay | Admitting: Pharmacist

## 2016-10-16 ENCOUNTER — Ambulatory Visit: Payer: Self-pay | Admitting: Pharmacist

## 2016-10-18 ENCOUNTER — Ambulatory Visit: Payer: Self-pay | Admitting: *Deleted

## 2016-10-19 ENCOUNTER — Ambulatory Visit: Payer: Self-pay | Admitting: Pharmacist

## 2016-10-19 ENCOUNTER — Other Ambulatory Visit: Payer: Self-pay | Admitting: Pharmacist

## 2016-10-19 ENCOUNTER — Other Ambulatory Visit: Payer: Self-pay | Admitting: *Deleted

## 2016-10-19 NOTE — Patient Outreach (Signed)
[facility]) Care Management  10/19/2016  Hannah Schultz Nov 16, 1961 [id number]   54 year old female referred to [place] for medication assistance. Per referral patient is in medicare coverage gap and is having difficulty affording her antidiabetic medications spending $300/month out of pocket for medications and is interested in seeing if she is eligible for any patient assistance programs or if she is maximizing her [employer] benefit.  Patient was previously referred to [place] in 05/2016 and someone at [facility] was to assist patient with medication assisstance application once out of pocket requirement was met.  Was unable to reach patient today via telephone and I have left a voicemail asking her to return my call (unsuccessful outreach #2).  Plan:  Will followup next week via telephone.  Hannah Schultz, PharmD [facility] PGY2 Pharmacy Resident [phone]

## 2016-10-19 NOTE — Patient Outreach (Signed)
[facility]) Care Management  10/19/2016  AWA BACHICHA 1961-10-23 [id number]  I was unable to reach Mrs. Santizo by phone this afternoon when I called to reschedule our cancelled home visit (secondary to a death in her family). I was able to leave a HIPPA compliant voice message requesting a return call.   Plan: I will reach out to Mrs. Batte again next week.    [place]  [phone]

## 2016-10-23 ENCOUNTER — Other Ambulatory Visit: Payer: Self-pay | Admitting: Pharmacist

## 2016-10-23 ENCOUNTER — Encounter: Payer: Self-pay | Admitting: *Deleted

## 2016-10-23 ENCOUNTER — Other Ambulatory Visit: Payer: Self-pay | Admitting: *Deleted

## 2016-10-23 NOTE — Patient Outreach (Signed)
[facility]) Care Management  10/23/2016  Hannah Schultz 23-Sep-1961 [id number]  I received a call from [name] this afternoon wherein she asked if we could reschedule our recently missed home visit due to a death in her family. We are scheduled for a face to face visit at home on Thursday @ 12:30.   New Symptoms - [name] reported today that over the last 2-3 weeks she has experienced persistent intermittent chest pain, tingling in her right hand, and abdominal pain. She says that the symptoms can occur at any time, are not relieved or worsened by anything in particular, and usually subside within a few moments.   Acute/Chronic Pain (legs) - In addition, [name] reports that her leg pain, which she feels is related to her diabetic neuropathy, is worse at night over the last several weeks. She says that during the day, she does not have leg pain. She is taking Gabapentin $RemoveBeforeDEI'600mg'[id number]$  TID and says that she is quite sleepy each afternoon (after taking her 1pm Gabapentin dose) and requires a nap each day but at night she has trouble getting to sleep and has awakened every night for the last week or more with leg pain.   Plan: I will reach notify Dr. Luan Pulling and Dr. Chalmers Cater of [name]'s reported symptoms as outlined above and will see [name] at home on Thursday.    [place] [facility]  [phone]

## 2016-10-24 NOTE — Patient Outreach (Signed)
[facility]) Care Management  [place]   10/23/2016  CHAUNTELLE AZPEITIA 09-21-1961 [id number]  Subjective: 55 year old female referred to [place] for medication assistance.  Today via telephone she states she is in the Medicare Coverage gap and cannot afford her [place] Aas or Novolog pens which now cost more than $100 copay.  She states she has 1 pen of both [place] and Novolog remaining and she is receiving samples of Januvia from her endocrinologist. Earlier in 2017 she states her previous endocrinologist submitted a patient assistance application to [place] but this was denied due to her not meeting the out of pocket cost for her medications. She believes she now has spent ~$700 out of pocket in 2017 on her medications.    Objective:   Encounter Medications: Outpatient Encounter Prescriptions as of 10/23/2016  Medication Sig Note  . Cholecalciferol (VITAMIN D3) 5000 UNITS TABS Take 1 tablet by mouth daily.   Marland Kitchen FLUoxetine (PROZAC) 20 MG tablet Take 20 mg by mouth daily.   Marland Kitchen gabapentin (NEURONTIN) 300 MG capsule Take 600 mg by mouth 3 (three) times daily.    Marland Kitchen glucose blood (ONE TOUCH ULTRA TEST) test strip 3 times per day   . HYDROcodone-acetaminophen (NORCO/VICODIN) 5-325 MG per tablet Take 1 tablet by mouth every 4 (four) hours as needed.   . Insulin Aspart (NOVOLOG FLEXPEN [place]) Inject 10 Units into the skin 3 (three) times daily.   . Insulin Degludec (TRESIBA FLEXTOUCH) 200 UNIT/ML SOPN Inject 60 Units into the skin once.  10/03/2016: MD changed dose to 60u  . Insulin Pen Needle (BD PEN NEEDLE NANO U/F) 32G X 4 MM MISC 1 each by Does not apply route 4 (four) times daily.   . nebivolol (BYSTOLIC) 5 MG tablet Take 5 mg by mouth daily.   09/18/2016: Last refill 04/18/2015 #30  . ranitidine (ZANTAC) 75 MG tablet Take 75 mg by mouth at bedtime.   . simvastatin (ZOCOR) 40 MG tablet Take 1 tablet (40 mg total) by mouth every evening.   . sitaGLIPtin (JANUVIA) 100 MG  tablet Take 100 mg by mouth daily.   . trazodone (DESYREL) 300 MG tablet Take 300 mg by mouth at bedtime.     Marland Kitchen glimepiride (AMARYL) 4 MG tablet Take 4 mg by mouth daily with breakfast. 09/18/2016: Just ran out; having refilled  . valsartan-hydrochlorothiazide (DIOVAN-HCT) 160-25 MG per tablet Take 1 tablet by mouth daily.   . [DISCONTINUED] BD INSULIN SYRINGE ULTRAFINE 31G X 5/16" 1 ML MISC    . [DISCONTINUED] insulin aspart protamine - aspart (NOVOLOG 70/30 MIX) (70-30) 100 UNIT/ML FlexPen Inject into the skin. 07/05/2015: Received from: [place]  . [DISCONTINUED] insulin detemir (LEVEMIR) 100 UNIT/ML injection Inject 70 Units into the skin. 07/05/2015: Received from: [place]  . [DISCONTINUED] insulin regular (NOVOLIN R,HUMULIN R) 100 units/mL injection Inject 20 Units into the skin 3 (three) times daily before meals. 10/03/2016: Prescribed 10u qmeal; patient increased to 20u qmeal  . [DISCONTINUED] linagliptin (TRADJENTA) 5 MG TABS tablet Take 5 mg by mouth daily. 09/18/2016: Changed to sitagliptin (Januvia); has samples   No facility-administered encounter medications on file as of 10/23/2016.     Functional Status: In your present state of health, do you have any difficulty performing the following activities: 10/03/2016  Hearing? N  Vision? N  Difficulty concentrating or making decisions? N  Walking or climbing stairs? Y  Dressing or bathing? N  Doing errands, shopping? N  Preparing Food and eating ?  N  Using the Toilet? N  In the past six months, have you accidently leaked urine? N  Do you have problems with loss of bowel control? N  Managing your Medications? N  Managing your Finances? N  Housekeeping or managing your Housekeeping? N  Some recent data might be hidden    Fall/Depression Screening: PHQ 2/9 Scores 10/23/2016 09/04/2016  PHQ - 2 Score 0 1    Assessment: Medication Assistance:  [employer] Medicare patient currently in the coverage gap and cannot afford  copays for [place], Novolog, and Januvia.  She has previously been denied extra help/low income subsidy and has not met the required $1000 or 4-5% of income to qualify for manufacturer patient assistance.   Plan: -Will contact patients endocrinologist for samples of Tresiba or Novolog or ask her to consider using Relion Insulin NPH and Regular from [facility] as these are $24 per 10 mL vial. -Will followup in 1-2 days  Bennye Alm, PharmD [facility] PGY2 Pharmacy Resident [phone]

## 2016-10-25 ENCOUNTER — Other Ambulatory Visit: Payer: Self-pay | Admitting: *Deleted

## 2016-10-25 ENCOUNTER — Ambulatory Visit: Payer: Commercial Managed Care - HMO | Admitting: *Deleted

## 2016-10-25 NOTE — Patient Outreach (Signed)
[facility]) Care Management   10/25/2016  ARNETIA BRONK 1961-08-27 [id number]  Hannah Schultz is an 55 y.o. female with medical history of Diabetes Mellitus, Type 2, hyperlipidemia, and hypertension. She was referred to [place] [facility] by her endocrinologist for assistance with medication management and program assistance. She is in the donut hole and has required medication assistance from our team. [place] team has been working closely with Mrs. Blank and referred her to the nursing team for care coordination and assistance with disease management and education.   Subjective: "I'm looking forward to meeting with the diabetes educator. I want to learn more and get this under control."  Objective:  Pulse (!) 58   SpO2 96%   Review of Systems  Constitutional: Positive for malaise/fatigue.  HENT: Negative.   Eyes: Negative.   Respiratory: Negative.   Cardiovascular: Negative.   Gastrointestinal: Negative.   Genitourinary: Negative.   Musculoskeletal: Positive for myalgias. Negative for falls.  Skin: Negative.   Neurological: Negative.   Psychiatric/Behavioral: Negative.     Physical Exam  Constitutional: She is oriented to person, place, and time. She appears well-developed and well-nourished. She is active. She does not have a sickly appearance. She does not appear ill.  Cardiovascular: Normal rate and regular rhythm.   Respiratory: Effort normal. She has no wheezes. She has no rhonchi. She has no rales.  GI: Soft. Bowel sounds are normal.  Neurological: She is alert and oriented to person, place, and time.  Skin: Skin is warm, dry and intact.  Psychiatric: She has a normal mood and affect. Her speech is normal and behavior is normal. Judgment and thought content normal. Cognition and memory are normal.    Encounter Medications:   Outpatient Encounter Prescriptions as of 10/25/2016  Medication Sig Note  . Cholecalciferol (VITAMIN D3) 5000 UNITS  TABS Take 1 tablet by mouth daily.   Marland Kitchen FLUoxetine (PROZAC) 20 MG tablet Take 20 mg by mouth daily.   Marland Kitchen gabapentin (NEURONTIN) 300 MG capsule Take 600 mg by mouth 3 (three) times daily.    Marland Kitchen glimepiride (AMARYL) 4 MG tablet Take 4 mg by mouth daily with breakfast. 09/18/2016: Just ran out; having refilled  . glucose blood (ONE TOUCH ULTRA TEST) test strip 3 times per day   . HYDROcodone-acetaminophen (NORCO/VICODIN) 5-325 MG per tablet Take 1 tablet by mouth every 4 (four) hours as needed.   . Insulin Aspart (NOVOLOG FLEXPEN [place]) Inject 10 Units into the skin 3 (three) times daily.   . Insulin Degludec (TRESIBA FLEXTOUCH) 200 UNIT/ML SOPN Inject 60 Units into the skin once.  10/03/2016: MD changed dose to 60u  . Insulin Pen Needle (BD PEN NEEDLE NANO U/F) 32G X 4 MM MISC 1 each by Does not apply route 4 (four) times daily.   . nebivolol (BYSTOLIC) 5 MG tablet Take 5 mg by mouth daily.   09/18/2016: Last refill 04/18/2015 #30  . ranitidine (ZANTAC) 75 MG tablet Take 75 mg by mouth at bedtime.   . simvastatin (ZOCOR) 40 MG tablet Take 1 tablet (40 mg total) by mouth every evening.   . sitaGLIPtin (JANUVIA) 100 MG tablet Take 100 mg by mouth daily.   . trazodone (DESYREL) 300 MG tablet Take 300 mg by mouth at bedtime.     . valsartan-hydrochlorothiazide (DIOVAN-HCT) 160-25 MG per tablet Take 1 tablet by mouth daily.    Assessment:  55 year old female living in [place] with uncontrolled diabetes, contributing factors include: financial constraints,  medication assistance needs, and knowledge deficits.   Medication Management and Assistance- when I first saw [name] earlier in the month, she admitted she had not taken her medications exactly as prescribed but had been taking her diabetes medications and insulin. [name] says her out of pocket cost for medications is currently >$300. I referred her to our pharmacy team for assistance as per her request. She has been working with Bennye Alm  PharmD regarding medication assistance and management needs.   Chronic Health Condition (DM) - Mrs. College has more recently been checking her CBG's TID and her average readings are now just under 200. She says that prior to changing to her new insulin and working with Dr. Chalmers Cater, her cbg's routinely were in the 300 range. She is currently on a regimen including Triseba injectable and Novolin sliding scale insulin. In addition, [name] is taking Glimepiride and Januvia.   HGA1C:  01/31/16 = 8.5 06/25/16 = 14  09/24/16 = 8.0  Mrs. Mumaw was referred to the Diabetes and [place]. She is scheduled for her first visit next week.    Acute/Chronic Health Condition (dry mouth) - [name] complains of "dry mouth" and says this is a chronic problem. She asked for recommendations about over the counter treatment. I recommended that she speak with her dentist or Dr. Luan Pulling on their next visit. She says she has used Biotene in the past but it doesn't really seem to help. She plans also to discuss this with her diabetes educator next week.    Plan:   [name] will take her medications as prescribed daily and will continue to work with our pharmacy team to obtain assistance for medication procurement needs.   [name] will check her cbg's daily as prescribed, calling her provider for findings outside established parameters.   [name] will attend all scheduled provider appointments.   I will follow up with Mrs. Fauteux by phone over the next few weeks.    [place] [facility]  [phone]

## 2016-10-30 ENCOUNTER — Encounter: Payer: Commercial Managed Care - HMO | Attending: Endocrinology | Admitting: Dietician

## 2016-10-30 ENCOUNTER — Encounter: Payer: Self-pay | Admitting: Dietician

## 2016-10-30 ENCOUNTER — Other Ambulatory Visit: Payer: Self-pay | Admitting: Pharmacist

## 2016-10-30 DIAGNOSIS — IMO0002 Reserved for concepts with insufficient information to code with codable children: Secondary | ICD-10-CM

## 2016-10-30 DIAGNOSIS — Z6841 Body Mass Index (BMI) 40.0 and over, adult: Secondary | ICD-10-CM | POA: Insufficient documentation

## 2016-10-30 DIAGNOSIS — E119 Type 2 diabetes mellitus without complications: Secondary | ICD-10-CM | POA: Diagnosis present

## 2016-10-30 DIAGNOSIS — E118 Type 2 diabetes mellitus with unspecified complications: Secondary | ICD-10-CM

## 2016-10-30 DIAGNOSIS — Z713 Dietary counseling and surveillance: Secondary | ICD-10-CM | POA: Diagnosis present

## 2016-10-30 DIAGNOSIS — Z794 Long term (current) use of insulin: Secondary | ICD-10-CM

## 2016-10-30 DIAGNOSIS — E1165 Type 2 diabetes mellitus with hyperglycemia: Secondary | ICD-10-CM

## 2016-10-30 NOTE — Progress Notes (Signed)
Medical Nutrition Therapy:  Appt start time: 9381 end time:  8299.   Assessment:  Primary concerns today: Patient is here alone.  She would like to learn what to eat, the portions, healthiest choices.  She was referred for type 2 diabetes, obesity, pure hypercholesterolemia, and HTN.   Other hx includes depression and PTSD.  These interfere wither her diabetes care at times and will also overeat at times due to stress.  She also has OSA but has not completed a sleep study and was waiting on a return call.  She does not wear a C-pap.  She sleeps around 10 hours per night and naps for 2 hours each afternoon.  Weight today 283 lbs and lost 35-40 lbs this last spring when she omitted regular soda and increased her exercise for a 30 day fast at church but then resumed habits and regained the weight.  Her A1C was up to 15% about 3 months ago per patient.  She was on medication that she could not afford and ran out of it.  She lost her vision for a time and this is now improving.  Her last A1C was 8.6%.  She checks her blood sugar 3 times per day and it is genally always over 170.  She states that she had education years ago but needed an update.  Patient lives with her husband.  He does the shopping and cooking.  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  Preferred Learning Style:   No preference indicated   Learning Readiness:   Ready   MEDICATIONS: see list to include Tresiba 60 units q am and Novolog 10 units with meals as well as Januvia.   DIETARY INTAKE:  Usual eating pattern includes 3 meals and 2 snacks per day. Avoided foods include milk.    24-hr recall:  B ( AM): 2 red hots (sausage) and a scrambled egg  Snk ( AM): none L ( PM): Whopper, fries, regular soda Snk ( PM): jerky or Little Debbie D ( PM): chicken (fried or baked), vegetables, rice Snk ( PM): scoop of ice cream or Little Debbie or Honey Bun Beverages: water, regular soda (2 cans per day),  sweet tea  Usual physical activity: goes to the gym 3 days per week for 30-45 minutes (treadmill or bike or flex machine), cleans her house.  Estimated energy needs: 1500 calories 170 g carbohydrates   112 g protein 42 g fat  Progress Towards Goal(s):  In progress.   Nutritional Diagnosis:  NB-1.1 Food and nutrition-related knowledge deficit As related to balance of carbohydrate, protein, and fat.  As evidenced by diet hx and patient report.    Intervention:  Nutrition counseling and diabetes education initiated. Discussed Carb Counting by food group as method of portion control, reading food labels, and benefits of increased activity. Also discussed basic physiology of Diabetes, target BG ranges pre and post meals, and A1c. Discussed emotional eating and motivation to make habit changes.  Discussed importance of following up on OSA and effects of poor sleep on weight and blood sugar.  Call to follow up on a sleep study appointment. Be as active as possible.  Aim for 30 minutes most days of the week. Rethink what you drink.  Aim for beverages without added sugar and without carbohydrate. Bake, broil, grill rather than fry most often. Eat slowly and stop when you are satisfied. Eat at the table away from the  TV.  Plan:  Aim  for 3 Carb Choices per meal (45 grams) +/- 1 either way  Aim for 0-1 Carbs per snack if hungry  Include protein in moderation with your meals and snacks Consider reading food labels for Total Carbohydrate and Fat Grams of foods Consider checking BG at alternate times per day as directed by MD  Consider taking medication as directed by MD  Teaching Method Utilized:  Visual Auditory Hands on  Handouts given during visit include: Living Well with Diabetes Carb Counting and Food Label handouts Meal Plan Card  My plate  Snack list  DASH diet  Barriers to learning/adherence to lifestyle change: see list  Demonstrated degree of understanding via:  Teach  Back   Monitoring/Evaluation:  Dietary intake, exercise, label reading , and body weight in 2 month(s).

## 2016-10-30 NOTE — Patient Outreach (Signed)
[facility]) Care Management  10/30/2016  LEATHER ESTIS [birth year] [id number]   55 year old female referred to [place] [facility] for medication assistance.  She is in the Medicare Coverage gap and cannot afford her Tyler Aas or Novolog pens which now cost more than $100 copay and she is receiving samples of Januvia from her endocrinologist.  She states she has 1 pen of Tresiba remaining and is currently out of Novolog as of this morning.  I have called Dr Porfirio Oar office and they do not have samples available. Dr Chalmers Cater has called in [place] NPH insulin 30 units twice daily and Relion Regular insulin 10 units three times daily. Patient states that she should be able to afford the ~$25 per vial of NPH and Regular Insulin.   Plan: -Discussed NPH insulin and regular insulin changes with patient per Dr Porfirio Oar orders.  Instructed her that she can finish her Tyler Aas pen but she should replace the Novolog she is out of with the Relion Regular 10 units three times daily.  Once she is out of [place] she should start Relion NPH 30 units twice daily.   -Counseled on signs/symptoms and treatment of hypoglycemia and that these new insulins she is starting are more likely to cause hypoglycemia.  -[facility] will sign off as medication assistance has been arranged.  Please reconsult if further assistance is needed.

## 2016-10-30 NOTE — Patient Outreach (Signed)
Bennye Alm PharmD is closing this case to Pharmacy.

## 2016-10-30 NOTE — Patient Instructions (Addendum)
Call to follow up on a sleep study appointment. Be as active as possible.  Aim for 30 minutes most days of the week. Rethink what you drink.  Aim for beverages without added sugar and without carbohydrate. Bake, broil, grill rather than fry most often. Eat slowly and stop when you are satisfied. Eat at the table away from the  TV.  Plan:  Aim for 3 Carb Choices per meal (45 grams) +/- 1 either way  Aim for 0-1 Carbs per snack if hungry  Include protein in moderation with your meals and snacks Consider reading food labels for Total Carbohydrate and Fat Grams of foods Consider checking BG at alternate times per day as directed by MD  Consider taking medication as directed by MD

## 2016-11-20 ENCOUNTER — Other Ambulatory Visit: Payer: Self-pay | Admitting: *Deleted

## 2016-11-20 NOTE — Patient Outreach (Signed)
[facility]) Care Management  11/20/2016  Hannah Schultz 06/21/1961 [id number]  I was unable to reach [name] by phone today.   Plan: I will reach out to her again by phone this week.    [place] Management  [phone]

## 2016-11-22 ENCOUNTER — Other Ambulatory Visit: Payer: Self-pay | Admitting: *Deleted

## 2016-11-22 NOTE — Patient Outreach (Signed)
[facility]) Care Management  11/22/2016  Hannah Schultz 1961/05/07 [id number]  Hannah Schultz is an 55 y.o. female with medical history of Diabetes Mellitus, Type 2, hyperlipidemia, and hypertension. She was referred to [facility] by her endocrinologist for assistance with medication management and program assistance. She is in the donut hole and has required medication assistance from our team. [place] team has been working closely with Hannah Schultz and referred her to the nursing team for care coordination and assistance with disease management and education.    New Symptoms - In November, Hannah Schultz reported a 2-3 weeks history of persistent intermittent chest pain, tingling in her right hand, and abdominal pain. The symptoms occured spontaneously and were not related to any particular activity, were not relieved or worsened by anything in particular, and usually subsided within a few moments. Today, Hannah Schultz denies any of these symptoms despite lots of family stressors over the last few weeks.   Acute/Chronic Pain (legs) - Hannah Schultz has had somewhat worsening leg discomfort, particularly at night over the last month. Today, she denies that the symptoms are worse. She is still taking Gabapentin as prescribed.     Chronic Health Condition (DM) - Hannah Schultz has been checking her CBG's TID and her average readings remain in the 200's. She is currently on a medication regimen including Relion, NPH, Glimepiride and Januvia.   Signs/Symptoms and treatment of hypoglycemia - counseled on signs/symptoms and treatment of hypoglycemia and that these new insulins she is starting are more likely to cause hypoglycemia    HGA1C:  01/31/16 = 8.5 06/25/16 = 14  09/24/16 = 8.0   Hannah Schultz was referred to the [facility] and has been meeting with the dietician/certified diabetes educator. She is grateful for this resource and says she feels  the knowledge and information and giving her tools to better manage her health. .   Medication Management/Assistance - Hannah Schultz has been working with Bennye Alm PharmD on medication assistance programs. Please see Dr. Zara Council detailed note.    Plan: I will reach out to Hannah Schultz again in a few weeks, just after Christmas. Hannah Schultz will continue daily cbg checks and maintain adherence to her prescribed medication regimen. She will call her provider for any new or worsened symptoms.    [place] Management  [phone]

## 2016-12-06 ENCOUNTER — Other Ambulatory Visit: Payer: Self-pay | Admitting: *Deleted

## 2016-12-06 NOTE — Patient Outreach (Signed)
[facility]) Care Management  12/06/2016  HIBO BLASDELL 11-25-1961 [id number]  Milady Fleener Womackis an 55 y.o.femalewith medical history of Diabetes Mellitus, Type 2, hyperlipidemia, and hypertension. She was referred to [place] by her endocrinologist for assistance with medication management and program assistance. She is in the donut hole and has required medication assistance from our team. [facility] team has been working closely with Mrs. Varnell and referred her to the nursing team for care coordination and assistance with disease management and education.   Chronic Health Condition (DM) - Mrs. Dolman has been checking her CBG's TID and her average readings remain in the 200's. She is currently on a medication regimen including Relion, NPH, Glimepiride and Januvia.   HGA1C:  01/31/16 = 8.5 06/25/16 = 14  09/24/16 = 8.0  Mrs. Dieujuste was referred to the [facility] and has been meeting with the dietician/certified diabetes educator Antonieta Iba (last visit 10/30/16; next visit 12/31/16). She feels this resource is giving her tools to better manage her health.   Mrs. Hagans says she received instruction to have a HGA1C drawn and understood she would have to go to Dr. Almetta Lovely office for this. I advised that she call Dr. Almetta Lovely office and ask if the order could be sent to [facility] labs in [place] (less than 5 minutes from her home) where she could have the lab work done locally.   Medication Management/Assistance- Mrs. Requejo has been working with Bennye Alm PharmD on medication assistance programs. Please see Dr. Zara Council detailed note.    Plan:   Ms. Germani will take her medications as prescribed daily and will continue to work with our pharmacy team to obtain assistance for medication procurement needs.   Ms. Fronczak will check her cbg's daily as prescribed, calling her provider for findings outside established parameters.    Ms. Palm will attend all scheduled provider appointments.   Given that Mrs. Mamaril is now established with a diabetes educator, has connection to our pharmacy team for medication assistance resources, and is seeing improvement in her HgA1c based on modifications she is making to her lifestye (diet/medication management), we will try a transition to telephonic case management over the next 1-2 months. If she continues to do well, meet goals, and feels comfortable with her progress, we will anticipate either a discharge from case management services or transition to telephonic support.   I will reach out to Mrs. Godina by phone in 1 month.    [place] Management  [phone]

## 2016-12-14 ENCOUNTER — Other Ambulatory Visit: Payer: Self-pay | Admitting: *Deleted

## 2016-12-14 NOTE — Patient Outreach (Signed)
[facility]) Care Management  12/14/2016  Hannah Schultz Dec 11, 1960 [id number]  Call received from Hannah Schultz today to report 2 new health concerns:   1) CBG Management - Hannah Schultz reports that she has had fasting and non fasting cbg's in the "upper 200's" this week which is higher than she has had over the prior few weeks. She has not made changes in her medication regimen and is adhering to her prescribed carb modified diet, per her report. She denies any symptoms associated with hyperglcyemia and denies any episodes of hypoglycemia. She has experienced increased stress related to her husband's recent injury. She denies having had any recent illness, mild or otherwise.   I reached out to Dr. Almetta Lovely office and forwarded this note to her and to the registered dietician, Antonieta Iba, who has been working with Hannah Schultz.    2) Knee "coming out of joint" - Hannah Schultz reports that her right knee "comes out of joint" sometimes and she has had more problems with this lately. I advised that she reach out to her previous orthopedist to request an appointment and to let me know if she had difficulty reaching him. She denies having had any falls but says she has had to "catch herself" on a few occasions.   Plan: I will follow up Dr. Almetta Lovely office regarding cbg management and with Hannah Schultz next week on Monday.    [place] Management  [phone]

## 2016-12-17 ENCOUNTER — Other Ambulatory Visit: Payer: Self-pay | Admitting: *Deleted

## 2016-12-17 NOTE — Patient Outreach (Signed)
[facility]) Care Management  12/17/2016  Hannah Schultz Feb 07, 1961 [id number]  I received a call from Hannah Schultz on Friday reporting 2 new health concerns:   1) CBG Management - Hannah Schultz reports that she has had fasting and non fasting cbg's in the "upper 200's" last week which is higher than she has had over the prior few weeks. She has not made changes in her medication regimen and is adhering to her prescribed carb modified diet, per her report. She denied any symptoms associated with hyperglcyemia and denied any episodes of hypoglycemia. She has experienced increased stress related to her husband's recent injury. She denies having had any recent illness, mild or otherwise.   On Friday, I reached out to Dr. Almetta Lovely office and to the registered dietician, Antonieta Iba, who has been working with Hannah Schultz re: the changes as noted above.   Today, I called Dr. Almetta Lovely office and spoke with Janett Billow who took a message and forwarded it to the nurse for Dr. Chalmers Cater. When I spoke with Hannah Schultz, she stated she'd received a message from Dr. Almetta Lovely office and was to call back today with information about her cbg findings.    2) Knee "coming out of joint" - Hannah Schultz reported on Friday that her right knee "comes out of joint" sometimes and she has had more problems with this lately.She denied having had any falls but says she has had to "catch herself" on a few occasions  I advised that she reach out to her previous orthopedist to request an appointment and to let me know if she had difficulty reaching him. .   Plan: Hannah Schultz asked if I might be able to see her this week to address some questions/concerns she has. I scheduled an appointment with her for Thursday 12/20/16 @ 1:30pm.    [place]  [phone]

## 2016-12-18 LAB — HEMOGLOBIN A1C: Hemoglobin A1C: 9.3

## 2016-12-20 ENCOUNTER — Telehealth: Payer: Self-pay | Admitting: Dietician

## 2016-12-20 ENCOUNTER — Other Ambulatory Visit: Payer: Self-pay | Admitting: *Deleted

## 2016-12-20 NOTE — Patient Outreach (Signed)
[facility]) Care Management   12/20/2016  TARSHA BLANDO [birth year] [id number]  Hannah Schultz is an 56 y.o. female with medical history of Diabetes Mellitus, Type 2, hyperlipidemia, and hypertension. She was referred to [facility] by her endocrinologist for assistance with medication management and program assistance. Our pharmacy team has been working closely with Hannah Schultz and referred her to the nursing team for care coordination and assistance with disease management and education.   Subjective: "I just feel awful. My face is swollen and I'm congested. And my teeth hurt."  Objective:  BP 124/88   Pulse 70   SpO2 98%   Review of Systems  Constitutional: Positive for malaise/fatigue. Negative for chills and fever.  HENT: Negative.   Eyes: Negative.   Respiratory: Positive for cough. Negative for sputum production, shortness of breath and wheezing.   Cardiovascular: Negative for chest pain and palpitations.  Gastrointestinal: Negative.   Genitourinary: Negative.   Musculoskeletal: Negative for falls.  Skin: Negative.   Neurological: Positive for headaches. Negative for dizziness.  Psychiatric/Behavioral: Negative.     Physical Exam  Constitutional: She is oriented to person, place, and time. Vital signs are normal. She appears well-developed and well-nourished. She appears listless. She does not have a sickly appearance. She appears ill.  Respiratory: Effort normal and breath sounds normal. No respiratory distress. She has no wheezes. She has no rhonchi. She has no rales.  GI: Soft. Bowel sounds are normal.  Neurological: She is oriented to person, place, and time. She appears listless.  Skin: Skin is warm, dry and intact.  Psychiatric: She has a normal mood and affect. Her speech is normal and behavior is normal. Judgment and thought content normal. Cognition and memory are normal.    Encounter Medications:   Outpatient Encounter Prescriptions  as of 12/20/2016  Medication Sig Note  . Insulin Aspart (NOVOLOG FLEXPEN [place]) Inject 10 Units into the skin 3 (three) times daily. 12/20/2016: Now taking 30 units TID as per MD  . Insulin Degludec (TRESIBA FLEXTOUCH) 200 UNIT/ML SOPN Inject 60 Units into the skin once.  12/20/2016: As per MD, patient is now taking 70 units    . Cholecalciferol (VITAMIN D3) 5000 UNITS TABS Take 1 tablet by mouth daily.   Marland Kitchen FLUoxetine (PROZAC) 20 MG tablet Take 20 mg by mouth daily.   Marland Kitchen gabapentin (NEURONTIN) 300 MG capsule Take 600 mg by mouth 3 (three) times daily.    Marland Kitchen glimepiride (AMARYL) 4 MG tablet Take 4 mg by mouth daily with breakfast. 09/18/2016: Just ran out; having refilled  . glucose blood (ONE TOUCH ULTRA TEST) test strip 3 times per day   . HYDROcodone-acetaminophen (NORCO/VICODIN) 5-325 MG per tablet Take 1 tablet by mouth every 4 (four) hours as needed.   . Insulin Pen Needle (BD PEN NEEDLE NANO U/F) 32G X 4 MM MISC 1 each by Does not apply route 4 (four) times daily.   . nebivolol (BYSTOLIC) 5 MG tablet Take 5 mg by mouth daily.   09/18/2016: Last refill 04/18/2015 #30  . ranitidine (ZANTAC) 75 MG tablet Take 75 mg by mouth at bedtime.   . simvastatin (ZOCOR) 40 MG tablet Take 1 tablet (40 mg total) by mouth every evening.   . sitaGLIPtin (JANUVIA) 100 MG tablet Take 100 mg by mouth daily.   . trazodone (DESYREL) 300 MG tablet Take 300 mg by mouth at bedtime.     . valsartan-hydrochlorothiazide (DIOVAN-HCT) 160-25 MG per tablet Take 1 tablet by  mouth daily.    Assessment:    Chronic Health Condition (DM) - Mrs. Speagle has been checking her CBG's TID and her average readingsremain in the 200's. Sheis currently on a medication regimen includingRelion,NPH, Glimepiride and Januvia.   HGA1C:  01/31/16 = 8.5 06/25/16 = 14  09/24/16 = 8.0  Over the last week, her cbg findings have increased dramatically. I helped her get in touch with Dr. Almetta Lovely office at the end of last week to report the findings  and Dr. Almetta Lovely office returned a call to Hannah Schultz advising her to increase her insulin doses as follows:   Novolog 30u TID Tyler Aas 70u daily  Mrs. Territo changed to this regimen TODAY. Her fasting cbg today was 370. Her averages were as follows:   7 day = 336 14 day = 303 30 day = 268 60 day = 245 90 day = 235  [name] was [facility] has been meeting with the dietician/certified diabetes educator Antonieta Iba (last visit 10/30/16; next visit 12/31/16). She feels this resource is giving her tools to better manage her health.   Acute Health Condition (upper respiratory symptoms) - today, Hannah Schultz is having facial pressure and congestion, mild swelling of the right side of her face, dental pain on the right, mild sore throat, and cough. She says her nasal secretions are yellow in color. Her breath sounds are clear. Her O2 sat is 97% on RA. I reported these symptoms to Dr. Luan Pulling' nurse who offered to forward the information to him.  Plan:   Hannah Schultz will adhere to the changes made by Dr. Chalmers Cater in her insulin regimen.   Hannah Schultz will continue to check cbg's TID as prescribed.   Hannah Schultz will eat three times daily, even if she has poor appetite, to avoid hypoglcyemia with new insulin dosing.   Hannah Schultz will call if she has not heard from Dr. Luan Pulling' office re: respiratory symptoms and treatment plan.   I will follow up with Hannah Schultz by phone no later than Monday.    [facility] Management  [phone]

## 2016-12-20 NOTE — Telephone Encounter (Signed)
Brief Nutrition Note [place] received from [facility] regarding patient's high blood sugar levels.  We were to have a follow up appointment this month but none noted.  Called patient to discuss appointment need and/or discuss any questions that she has.    Patient was unavailable.  Message left and patient is to call as needed.  Antonieta Iba, RD, LDN

## 2016-12-24 ENCOUNTER — Ambulatory Visit: Payer: Commercial Managed Care - HMO | Admitting: *Deleted

## 2016-12-24 ENCOUNTER — Other Ambulatory Visit: Payer: Self-pay | Admitting: *Deleted

## 2016-12-24 NOTE — Patient Outreach (Signed)
[facility]) Care Management  12/24/2016  Hannah Schultz 07-Jul-1961 [id number]  Brief telephone follow up with Hannah Schultz to assess progress on her upper respiratory symptoms. She reports today that her congestion is improved, her facial swelling is gone, and she does not have dental pain. She says that today, other than a little congestion, she feels she is "back to normal."   Plan: Hannah Schultz will call for any new or worsened symptoms.   I will reach out to Hannah Schultz by phone next month to continue follow up on her chronic disease management needs.    [place] Management  [phone]

## 2016-12-31 ENCOUNTER — Ambulatory Visit: Payer: Commercial Managed Care - HMO | Admitting: Dietician

## 2017-01-14 ENCOUNTER — Other Ambulatory Visit: Payer: Self-pay | Admitting: *Deleted

## 2017-01-14 NOTE — Patient Outreach (Signed)
[facility]) Care Management  01/14/2017  ZEKIAH COEN 01-01-1961 [id number]  I reached out to Mrs. Odette today to follow up on her general progress with DM management and her progress with URI.   URI - Mrs. Pla was recently treated for a sinus infection and upper respiratory infection with a course of oral antibiotics. She says her dental pain is gone and her cough is improved but she still has post nasal drip.   DM Management - Mrs. Bloyd reports that since she has been sick with the URI, her cbg's have been consistently in the 300's. She is taking all medications as prescribed and not missing doses. She has an appointment with her endocrinologist next week and is scheduled for labs this Friday. She denies symptoms related to her hyperglycemia.   Inquiry re: weight loss surgery - Mrs. Pardee has inquired twice about weight loss surgery. I advised that she discuss this with her primary care provider, Dr. Sinda Du and also with her endocrinologist. Mrs. Cobin says she is interested in getting more information about her options.   Plan: I will follow up with Mrs. Tornow by phone after her appointment with her endocrinologist and we will likely schedule a face to face visit at that time.    [place] Management  [phone]

## 2017-01-18 DIAGNOSIS — E7800 Pure hypercholesterolemia, unspecified: Secondary | ICD-10-CM | POA: Diagnosis not present

## 2017-01-18 DIAGNOSIS — E1165 Type 2 diabetes mellitus with hyperglycemia: Secondary | ICD-10-CM | POA: Diagnosis not present

## 2017-01-18 DIAGNOSIS — E1140 Type 2 diabetes mellitus with diabetic neuropathy, unspecified: Secondary | ICD-10-CM | POA: Diagnosis not present

## 2017-01-23 DIAGNOSIS — I10 Essential (primary) hypertension: Secondary | ICD-10-CM | POA: Diagnosis not present

## 2017-01-23 DIAGNOSIS — E785 Hyperlipidemia, unspecified: Secondary | ICD-10-CM | POA: Diagnosis not present

## 2017-01-23 DIAGNOSIS — E1140 Type 2 diabetes mellitus with diabetic neuropathy, unspecified: Secondary | ICD-10-CM | POA: Diagnosis not present

## 2017-01-24 ENCOUNTER — Ambulatory Visit: Payer: Self-pay | Admitting: *Deleted

## 2017-01-25 ENCOUNTER — Other Ambulatory Visit: Payer: Self-pay | Admitting: *Deleted

## 2017-01-25 DIAGNOSIS — E7800 Pure hypercholesterolemia, unspecified: Secondary | ICD-10-CM | POA: Diagnosis not present

## 2017-01-25 DIAGNOSIS — E1165 Type 2 diabetes mellitus with hyperglycemia: Secondary | ICD-10-CM | POA: Diagnosis not present

## 2017-01-25 DIAGNOSIS — I10 Essential (primary) hypertension: Secondary | ICD-10-CM | POA: Diagnosis not present

## 2017-01-25 NOTE — Patient Outreach (Signed)
[facility]) Care Management  01/25/2017  MYRTIE LEUTHOLD January 16, 1961 [id number]   Spoke wtih [place] via phone this after noon regarding recent provider visits.   Primary care/Pulmonary care: Mrs Direnzo saw Dr Luan Pulling in office this week and was prescribed oral steroids and antibiotics for ongoing URI symptoms.  They also discussed weight loss surgery and Mrs Ortner said Dr Luan Pulling is going to refer her for a sleep study and Cardiovascular consult.   Endocrinology- Mrs Hebdon saw Dr Chalmers Cater in the office and her Tyler Aas dose was increased to 80 units.  She also discussed weight loss surgery with Dr Chalmers Cater who offer to refer her to a [facility] provider secondary to the complexity of her case and potential for difficulties surrounding surgery.   Plan: I will see Mrs Wiggs at home on Tuesday afternoon to further review information provided to her and discuss plan of care moving forward.     Barbaraann Faster, RN, [place] Care Management  [phone]

## 2017-01-29 ENCOUNTER — Encounter (HOSPITAL_COMMUNITY): Payer: Self-pay | Admitting: Emergency Medicine

## 2017-01-29 ENCOUNTER — Emergency Department (HOSPITAL_COMMUNITY): Payer: PPO

## 2017-01-29 ENCOUNTER — Emergency Department (HOSPITAL_COMMUNITY)
Admission: EM | Admit: 2017-01-29 | Discharge: 2017-01-29 | Disposition: A | Discharge status: Home / Self Care | Payer: PPO | Attending: Emergency Medicine | Admitting: Emergency Medicine

## 2017-01-29 ENCOUNTER — Other Ambulatory Visit: Payer: Self-pay | Admitting: *Deleted

## 2017-01-29 DIAGNOSIS — R0602 Shortness of breath: Secondary | ICD-10-CM | POA: Diagnosis not present

## 2017-01-29 DIAGNOSIS — Z79899 Other long term (current) drug therapy: Secondary | ICD-10-CM | POA: Diagnosis not present

## 2017-01-29 DIAGNOSIS — Z794 Long term (current) use of insulin: Secondary | ICD-10-CM | POA: Insufficient documentation

## 2017-01-29 DIAGNOSIS — E119 Type 2 diabetes mellitus without complications: Secondary | ICD-10-CM | POA: Diagnosis not present

## 2017-01-29 DIAGNOSIS — R079 Chest pain, unspecified: Secondary | ICD-10-CM | POA: Insufficient documentation

## 2017-01-29 DIAGNOSIS — R0789 Other chest pain: Secondary | ICD-10-CM | POA: Diagnosis not present

## 2017-01-29 DIAGNOSIS — I10 Essential (primary) hypertension: Secondary | ICD-10-CM | POA: Insufficient documentation

## 2017-01-29 DIAGNOSIS — R05 Cough: Secondary | ICD-10-CM | POA: Diagnosis not present

## 2017-01-29 DIAGNOSIS — R590 Localized enlarged lymph nodes: Secondary | ICD-10-CM | POA: Insufficient documentation

## 2017-01-29 LAB — BASIC METABOLIC PANEL
ANION GAP: 8 (ref 5–15)
BUN: 16 mg/dL (ref 6–20)
CO2: 29 mmol/L (ref 22–32)
Calcium: 8.9 mg/dL (ref 8.9–10.3)
Chloride: 100 mmol/L — ABNORMAL LOW (ref 101–111)
Creatinine, Ser: 0.86 mg/dL (ref 0.44–1.00)
Glucose, Bld: 112 mg/dL — ABNORMAL HIGH (ref 65–99)
Potassium: 3.6 mmol/L (ref 3.5–5.1)
Sodium: 137 mmol/L (ref 135–145)

## 2017-01-29 LAB — CBC
HCT: 40 % (ref 36.0–46.0)
HEMOGLOBIN: 13.2 g/dL (ref 12.0–15.0)
MCH: 26.1 pg (ref 26.0–34.0)
MCHC: 33 g/dL (ref 30.0–36.0)
MCV: 79.1 fL (ref 78.0–100.0)
Platelets: 250 10*3/uL (ref 150–400)
RBC: 5.06 MIL/uL (ref 3.87–5.11)
RDW: 14.5 % (ref 11.5–15.5)
WBC: 3.5 10*3/uL — AB (ref 4.0–10.5)

## 2017-01-29 LAB — TROPONIN I: Troponin I: <0.03 ng/mL (ref <0.03)

## 2017-01-29 MED ORDER — IOPAMIDOL (ISOVUE-300) INJECTION 61%
75.0000 mL | Freq: Once | INTRAVENOUS | Status: AC | PRN
  Administered 2017-01-29: 75 mL via INTRAVENOUS

## 2017-01-29 NOTE — Discharge Instructions (Signed)
Take Tylenol as needed for pain  Call your doctor for a follow-up appointment in 1 week to be seen for a checkup and follow-up on the abnormal CT scan which was done today

## 2017-01-29 NOTE — ED Notes (Signed)
No answer when called to desk by nurse first.

## 2017-01-29 NOTE — ED Provider Notes (Signed)
[place] DEPT Provider Note   CSN: [id number] Arrival date & time: 01/29/17  1453     History   Chief Complaint Chief Complaint  Patient presents with  . Chest Pain    HPI Hannah Schultz is a 56 y.o. female.  She presents for evaluation of intermittent sharp chest pain felt in the left anterior region of her chest, beginning this morning.  Also for the last several days she has felt "sluggish".  Today she had a decreased appetite, so did not eat much.  Several weeks ago, her endocrinologist changed her diabetes medications.  She was treated for a sinus infection with Keflex, then saw her pulmonologist 5 days ago when he put her on a different antibiotic as well as methyl prednisolone, taper.  She does not smoke cigarettes.  Today she had a single episode of emesis, following a bout of coughing, which contained food material.  She has not had any diarrhea fever or chills.  No prior history of cardiac disease.  She states that several family members have had heart disease.  She has never been a smoker.  There are no other known modifying factors.   HPI  Past Medical History:  Diagnosis Date  . Acid reflux   . Arthritis   . Bulging lumbar disc   . Depression   . Diabetes mellitus   . Diabetic neuropathy ([place])   . High cholesterol   . Hypertension   . Post traumatic stress disorder (PTSD)   . Sleep apnea     Patient Active Problem List   Diagnosis Date Noted  . Other and unspecified hyperlipidemia 09/19/2013  . Unspecified essential hypertension 09/19/2013  . Type II or unspecified type diabetes mellitus without mention of complication, uncontrolled 07/20/2013  . Trigger thumb of right hand 11/27/2012  . CTS (carpal tunnel syndrome) 11/27/2012    Past Surgical History:  Procedure Laterality Date  . ABDOMINAL HYSTERECTOMY    . CHOLECYSTECTOMY    . TONSILLECTOMY  04/02/2012   Procedure: TONSILLECTOMY;  Surgeon: Ascencion Dike, MD;  Location: [facility] OR;  Service: ENT;   Laterality: Bilateral;    OB History    No data available       Home Medications    Prior to Admission medications   Medication Sig Start Date End Date Taking? Authorizing Provider  Cholecalciferol (VITAMIN D3) 5000 UNITS TABS Take 1 tablet by mouth daily.   Yes Historical Provider, MD  doxycycline (VIBRAMYCIN) 100 MG capsule Take 100 mg by mouth 2 (two) times daily. 10 day course starting on 01/23/2017 01/23/17  Yes Historical Provider, MD  FLUoxetine (PROZAC) 20 MG tablet Take 20 mg by mouth daily.   Yes Historical Provider, MD  gabapentin (NEURONTIN) 300 MG capsule Take 600 mg by mouth 3 (three) times daily.    Yes Historical Provider, MD  Insulin Degludec (TRESIBA FLEXTOUCH) 200 UNIT/ML SOPN Inject 80 Units into the skin every morning.    Yes Jacelyn Pi, MD  metFORMIN (GLUCOPHAGE-XR) 500 MG 24 hr tablet Take 500 mg by mouth at bedtime.  01/25/17  Yes Historical Provider, MD  methylPREDNISolone (MEDROL DOSEPAK) 4 MG TBPK tablet Take 4-32 mg by mouth See admin instructions. Take as directed per package instructions [phone]) 01/23/17  Yes Historical Provider, MD  nebivolol (BYSTOLIC) 5 MG tablet Take 5 mg by mouth daily.     Yes Historical Provider, MD  NOVOLIN N RELION 100 UNIT/ML injection Inject 20 Units into the skin 3 (three) times daily before meals.  12/13/16  Yes Historical Provider, MD  ranitidine (ZANTAC) 75 MG tablet Take 75 mg by mouth at bedtime.   Yes Historical Provider, MD  simvastatin (ZOCOR) 40 MG tablet Take 1 tablet (40 mg total) by mouth every evening. 09/22/13  Yes Elayne Snare, MD  sitaGLIPtin (JANUVIA) 100 MG tablet Take 100 mg by mouth daily.   Yes Historical Provider, MD  trazodone (DESYREL) 300 MG tablet Take 300 mg by mouth at bedtime.     Yes Historical Provider, MD  glucose blood (ONE TOUCH ULTRA TEST) test strip 3 times per day 12/29/13   Elayne Snare, MD  Insulin Aspart (NOVOLOG FLEXPEN [place]) Inject 20 Units into the skin 3 (three) times daily.     Jacelyn Pi, MD  Insulin Pen Needle (BD PEN NEEDLE NANO U/F) 32G X 4 MM MISC 1 each by Does not apply route 4 (four) times daily. 08/12/13   Elayne Snare, MD    Family History Family History  Problem Relation Age of Onset  . Heart disease    . Cancer    . Diabetes      Social History Social History  Substance Use Topics  . Smoking status: Never Smoker  . Smokeless tobacco: Never Used  . Alcohol use No     Allergies   Adhesive [tape]   Review of Systems Review of Systems  All other systems reviewed and are negative.    Physical Exam Updated Vital Signs BP 122/70 (BP Location: Right Arm)   Pulse 80   Temp 98.5 F (36.9 C) (Oral)   Resp 20   Ht $R'5\' 5"'kV$  (1.651 m)   Wt 272 lb (123.4 kg)   SpO2 100%   BMI 45.26 kg/m   Physical Exam  Constitutional: She is oriented to person, place, and time. She appears well-developed.  Morbidly obese  HENT:  Head: Normocephalic and atraumatic.  Eyes: Conjunctivae and EOM are normal. Pupils are equal, round, and reactive to light.  Neck: Normal range of motion and phonation normal. Neck supple.  Cardiovascular: Normal rate and regular rhythm.   Pulmonary/Chest: Effort normal and breath sounds normal. No respiratory distress. She has no wheezes. She exhibits no tenderness.  Abdominal: Soft. She exhibits no distension. There is no tenderness. There is no guarding.  Musculoskeletal: Normal range of motion. She exhibits no edema or tenderness.  Neurological: She is alert and oriented to person, place, and time. No cranial nerve deficit. She exhibits normal muscle tone. Coordination normal.  Skin: Skin is warm and dry.  Psychiatric: She has a normal mood and affect. Her behavior is normal. Judgment and thought content normal.  Nursing note and vitals reviewed.    ED Treatments / Results  Labs (all labs ordered are listed, but only abnormal results are displayed) Labs Reviewed  BASIC METABOLIC PANEL - Abnormal; Notable for the following:        Result Value   Chloride 100 (*)    Glucose, Bld 112 (*)    All other components within normal limits  CBC - Abnormal; Notable for the following:    WBC 3.5 (*)    All other components within normal limits  TROPONIN I    EKG  EKG Interpretation  Date/Time:  Tuesday January 29 2017 20:45:06 EST Ventricular Rate:  63 PR Interval:  146 QRS Duration: 86 QT Interval:  427 QTC Calculation: 438 R Axis:   31 Text Interpretation:  Sinus rhythm Low voltage, precordial leads since last tracing no significant change Confirmed by  Ryker.Hammer  MD, Vira Agar [phone]) on 01/29/2017 10:05:26 PM       Radiology Dg Chest 2 View  Result Date: 01/29/2017 CLINICAL DATA:  Left-sided chest pain, cough and shortness of breath since this morning. EXAM: CHEST  2 VIEW COMPARISON:  09/04/2013 FINDINGS: The cardiac silhouette is normal in size and configuration. No mediastinal masses. There is masslike fullness along the inferior aspect of the right hilum which appears new from the prior chest radiograph. Left hilum is unremarkable. Lungs are clear.  No pleural effusion.  No pneumothorax. Skeletal structures are unremarkable. IMPRESSION: 1. No acute cardiopulmonary disease. 2. Masslike area prominence along the inferior right hilum. Recommend follow-up chest CT with contrast for further assessment. Electronically Signed   By: Lajean Manes M.D.   On: 01/29/2017 15:30   Ct Chest W Contrast  Result Date: 01/29/2017 CLINICAL DATA:  Acute onset of left-sided chest pain, radiating to the back. Nausea, vomiting and shortness of breath. Lightheaded. Abnormal chest radiograph. Initial encounter. EXAM: CT CHEST WITH CONTRAST TECHNIQUE: Multidetector CT imaging of the chest was performed during intravenous contrast administration. CONTRAST:  68mL ISOVUE-300 IOPAMIDOL (ISOVUE-300) INJECTION 61% COMPARISON:  Chest radiograph performed earlier today at 3:15 p.m. FINDINGS: Cardiovascular: The heart is normal in size. The thoracic  aorta is unremarkable. The great vessels are within normal limits. No calcific atherosclerotic disease is seen. Mediastinum/Nodes: Enlarged mediastinal and bilateral hilar nodes are seen, measuring up to 1.8 cm at the mediastinum, 2.2 cm at the right hilum and 1.5 cm at the left hilum. Enlarged mediastinal nodes are noted at the right paratracheal, periaortic and subcarinal regions. No pericardial effusion is identified. The thyroid gland contains minimal hypodensities, but is otherwise unremarkable. No axillary lymphadenopathy is seen. Lungs/Pleura: Minimal bibasilar atelectasis is noted. The lungs are otherwise clear. No pleural effusion or pneumothorax is seen. No masses are identified. Upper Abdomen: The visualized portions of the liver are unremarkable. The spleen is somewhat bulky, but otherwise grossly unremarkable. The patient is status post cholecystectomy, with clips noted at the gallbladder fossa. The visualized portions of the pancreas, adrenal glands and kidneys are within normal limits. Prominent nodes are seen about the celiac trunk, measuring up to 1.5 cm in short axis. Musculoskeletal: No acute osseous abnormalities are identified. The visualized musculature is unremarkable in appearance. IMPRESSION: 1. Enlarged mediastinal and bilateral hilar nodes, measuring up to 2.2 cm. Enlarged nodes at the upper abdomen, about the celiac trunk. This could reflect lymphoma, sarcoidosis or a variety of inflammatory conditions. The appearance is less typical for metastatic disease. A number of the mediastinal nodes would be amenable to biopsy, as deemed clinically appropriate. 2. Minimal bibasilar atelectasis noted.  Lungs otherwise clear. Electronically Signed   By: Garald Balding M.D.   On: 01/29/2017 21:58    Procedures Procedures (including critical care time)  Medications Ordered in ED Medications  iopamidol (ISOVUE-300) 61 % injection 75 mL (75 mLs Intravenous Contrast Given 01/29/17 2131)      Initial Impression / Assessment and Plan / ED Course  I have reviewed the triage vital signs and the nursing notes.  Pertinent labs & imaging results that were available during my care of the patient were reviewed by me and considered in my medical decision making (see chart for details).     Medications  iopamidol (ISOVUE-300) 61 % injection 75 mL (75 mLs Intravenous Contrast Given 01/29/17 2131)    Patient Vitals for the past 24 hrs:  BP Temp Temp src Pulse Resp SpO2 Height Weight  01/29/17 2230 122/70 - - 80 20 100 % - -  01/29/17 2200 122/67 - - 64 19 98 % - -  01/29/17 2156 113/65 - - 64 19 98 % - -  01/29/17 2100 107/63 - - 65 13 96 % - -  01/29/17 2055 107/63 - - 60 10 96 % - -  01/29/17 2000 112/70 - - 64 17 100 % - -  01/29/17 1637 121/78 98.5 F (36.9 C) Oral 68 18 96 % - -  01/29/17 1513 107/65 99.8 F (37.7 C) Oral 72 24 96 % - -  01/29/17 1507 - - - - - - $R'5\' 5"'VN$  (1.651 m) 272 lb (123.4 kg)    At time of discharge-reevaluation with update and discussion. After initial assessment and treatment, an updated evaluation reveals she remains comfortable, findings discussed with patient and husband, all questions answered. Ronell Duffus L    Final Clinical Impressions(s) / ED Diagnoses   Final diagnoses:  Nonspecific chest pain  Mediastinal adenopathy    Nonspecific chest pain, with abnormal chest x-ray.  Doubt ACS PE or pneumonia.  CT imaging done to follow-up on abnormal of a nonspecific nature.  Patient is nontoxic and stable for discharge with outpatient management.  Nursing Notes Reviewed/ Care Coordinated Applicable Imaging Reviewed Interpretation of Laboratory Data incorporated into ED treatment  The patient appears reasonably screened and/or stabilized for discharge and I doubt any other medical condition or other [facility] requiring further screening, evaluation, or treatment in the ED at this time prior to discharge.  Plan: Home Medications-continue usual;  Home Treatments-rest, fluids; return here if the recommended treatment, does not improve the symptoms; Recommended follow up-PCP 1 week for further evaluation of abnormal CT imaging.   New Prescriptions Discharge Medication List as of 01/29/2017 10:18 PM       Daleen Bo, MD 01/30/17 1244

## 2017-01-29 NOTE — ED Triage Notes (Signed)
Pt awoke at 0400 from L sided CP, radiation into back. N/V/, SOB, and feeling lightheaded noted.

## 2017-01-29 NOTE — ED Notes (Signed)
Pt ambulatory to waiting room. Pt verbalized understanding of discharge instructions.   

## 2017-01-29 NOTE — Patient Outreach (Signed)
[facility]) Care Management   01/29/2017  [name] 17-Aug-1961 [id number]  Hannah Schultz is an 56 y.o. female being followed by [facility] CM for care coordination and education related DM management.    Arrived to pt home for scheduled visit and pt report chest pain since 4 am.   Subjective:  "Chest pain woke me up at 4 am and I have been sick all day"    Objective:  BP 110/70 (Patient Position: Standing)   Review of Systems  Constitutional: Negative for chills and diaphoresis.  Respiratory: Negative for shortness of breath.   Cardiovascular: Positive for chest pain.       Pt report of persistent chest pain with nausea and voimiting since 0400  Gastrointestinal: Positive for nausea and vomiting.  Neurological: Positive for dizziness and weakness.    Physical Exam  Constitutional: She is oriented to person, place, and time. She appears well-developed and well-nourished.  Cardiovascular: Normal rate and regular rhythm.   Respiratory: Effort normal.  Neurological: She is alert and oriented to person, place, and time.  Skin: Skin is warm and dry.    Encounter Medications:   Outpatient Encounter Prescriptions as of 01/29/2017  Medication Sig Note  . nebivolol (BYSTOLIC) 5 MG tablet Take 5 mg by mouth daily.   09/18/2016: Last refill 04/18/2015 #30  . valsartan-hydrochlorothiazide (DIOVAN-HCT) 160-25 MG per tablet Take 1 tablet by mouth daily.   . Cholecalciferol (VITAMIN D3) 5000 UNITS TABS Take 1 tablet by mouth daily.   Marland Kitchen FLUoxetine (PROZAC) 20 MG tablet Take 20 mg by mouth daily.   Marland Kitchen gabapentin (NEURONTIN) 300 MG capsule Take 600 mg by mouth 3 (three) times daily.    Marland Kitchen glimepiride (AMARYL) 4 MG tablet Take 4 mg by mouth daily with breakfast. 09/18/2016: Just ran out; having refilled  . glucose blood (ONE TOUCH ULTRA TEST) test strip 3 times per day   . HYDROcodone-acetaminophen (NORCO/VICODIN) 5-325 MG per tablet Take 1 tablet by mouth every 4 (four) hours as  needed.   . Insulin Aspart (NOVOLOG FLEXPEN [place]) Inject 10 Units into the skin 3 (three) times daily. 12/20/2016: Now taking 30 units TID as per MD  . Insulin Degludec (TRESIBA FLEXTOUCH) 200 UNIT/ML SOPN Inject 60 Units into the skin once.  01/25/2017: Per patient, MD increased dose to 80units   . Insulin Pen Needle (BD PEN NEEDLE NANO U/F) 32G X 4 MM MISC 1 each by Does not apply route 4 (four) times daily.   . methylPREDNISolone (MEDROL) 4 MG tablet Take 4 mg by mouth daily.   . ranitidine (ZANTAC) 75 MG tablet Take 75 mg by mouth at bedtime.   . simvastatin (ZOCOR) 40 MG tablet Take 1 tablet (40 mg total) by mouth every evening.   . sitaGLIPtin (JANUVIA) 100 MG tablet Take 100 mg by mouth daily.   . trazodone (DESYREL) 300 MG tablet Take 300 mg by mouth at bedtime.       Assessment:   56 yr old female with new and acute chest pain Reported and observed during scheduled community Cm visit. Advised and facilitated pt reporting to ED.  Plan:  I will follow up with pt and spouse regarding treatment plan.   Barbaraann Faster, RN, CCM/ Janalyn Shy, RN, CCM

## 2017-01-30 ENCOUNTER — Other Ambulatory Visit: Payer: Self-pay | Admitting: *Deleted

## 2017-01-30 NOTE — Patient Outreach (Signed)
[facility]) Care Management  01/30/2017  Hannah Schultz 1961-01-12 [id number]  Follow up call to Hannah Schultz today to get an update on her status since her ED visit yesterday (2/21/). Upon arrival at her home for a scheduled visit, patient was actively having left chest pain which radiated to the left shoulder blade which had awakened her from sleep at 4am. She had an episode of vomiting. In addition, she was dizzy and lightheaded. Her husband said these more acute symptoms had been ongoing for the previous 24 hours but the chest pain had worsened. I advised that Mrs. Cocco report to the emergency department and her husband drove her there.   I spoke with Hannah Schultz today by phone. She says she feels somewhat better but continues to have chest discomfort, even while we speak on the phone. She has not had any further vomiting but continues to have dizziness/lightheadedness when she sits up. Her cbg's remain in the "low 100's". She is eating and she is taking all medications as directed/prescribed.   Hannah Schultz reports that she was told by the emergency department physician that "a spot was found on the lung near my heart" and recommended that she follow up with her primary care provider and pulmonologist.   CXR Impression as outlined in [place]  "...IMPRESSION: 1. No acute cardiopulmonary disease. 2. Masslike area prominence along the inferior right hilum. Recommend follow-up chest CT with contrast for further assessment."   CT Chest Results as outlined in the MEDICAL RECORD NUMBER  "Mediastinum/Nodes: Enlarged mediastinal and bilateral hilar nodes are seen, measuring up to 1.8 cm at the mediastinum, 2.2 cm at the right hilum and 1.5 cm at the left hilum. Enlarged mediastinal nodes are noted at the right paratracheal, periaortic and subcarinal regions. No pericardial effusion is identified. The thyroid gland contains minimal hypodensities, but is otherwise  unremarkable. No axillary lymphadenopathy is seen.   Marland Kitchen.IMPRESSION: 1. Enlarged mediastinal and bilateral hilar nodes, measuring up to 2.2 cm. Enlarged nodes at the upper abdomen, about the celiac trunk. This could reflect lymphoma, sarcoidosis or a variety of inflammatory conditions. The appearance is less typical for metastatic disease. A number of the mediastinal nodes would be amenable to biopsy, as deemed clinically appropriate. 2. Minimal bibasilar atelectasis noted.  Lungs otherwise clear."   To assist Hannah Schultz, I reached out to [employer] @ Dr. Luan Pulling' office to assist with appointment request.   Plan: I will see Hannah Schultz at home next week on Tuesday. Mrs. Bonnie is to call Dr. Luan Pulling with any new or worsened symptoms.    [place] Management  [phone]

## 2017-01-31 ENCOUNTER — Encounter (HOSPITAL_BASED_OUTPATIENT_CLINIC_OR_DEPARTMENT_OTHER): Payer: Self-pay

## 2017-01-31 DIAGNOSIS — G4733 Obstructive sleep apnea (adult) (pediatric): Secondary | ICD-10-CM

## 2017-02-01 DIAGNOSIS — I10 Essential (primary) hypertension: Secondary | ICD-10-CM | POA: Diagnosis not present

## 2017-02-01 DIAGNOSIS — J9859 Other diseases of mediastinum, not elsewhere classified: Secondary | ICD-10-CM | POA: Diagnosis not present

## 2017-02-01 DIAGNOSIS — E119 Type 2 diabetes mellitus without complications: Secondary | ICD-10-CM | POA: Diagnosis not present

## 2017-02-04 ENCOUNTER — Other Ambulatory Visit (HOSPITAL_COMMUNITY): Payer: Self-pay | Admitting: Pulmonary Disease

## 2017-02-04 DIAGNOSIS — R599 Enlarged lymph nodes, unspecified: Secondary | ICD-10-CM

## 2017-02-05 ENCOUNTER — Other Ambulatory Visit: Payer: Self-pay | Admitting: *Deleted

## 2017-02-05 NOTE — Patient Outreach (Signed)
[facility]) Care Management   02/05/2017  Hannah Schultz 02-26-1961 [id number]  Hannah Schultz is an 56 y.o. female with medical history of Diabetes Mellitus, Type 2, hyperlipidemia, and hypertension. She was referred to [facility] by her endocrinologist for assistance with medication management. Our pharmacy team has been working closely with Hannah Schultz and referred her to the nursing team for care coordination and assistance with disease management and education.   Subjective: "I still feel pretty rotten but I haven't had chest pain quite as bad as I did that last day you were here."  Objective:  BP 114/78   Pulse 74   SpO2 98%   Review of Systems  Constitutional: Positive for malaise/fatigue. Negative for chills, diaphoresis and fever.  HENT: Negative.   Eyes: Negative.   Respiratory: Positive for cough and shortness of breath. Negative for hemoptysis, sputum production and wheezing.        Persistent dry non productive cough with ongoing episodes of shortness of breath  Cardiovascular: Positive for chest pain. Negative for palpitations and leg swelling.       Mild to moderate intermittent anterior chest discomfort  Gastrointestinal: Positive for nausea. Negative for vomiting.  Genitourinary: Negative.   Musculoskeletal: Positive for myalgias. Negative for falls.  Skin: Negative.   Neurological: Positive for dizziness and weakness. Negative for focal weakness and loss of consciousness.  Psychiatric/Behavioral: The patient is nervous/anxious.        "nervous" about the"spot on my lung"     Physical Exam  Encounter Medications:   Outpatient Encounter Prescriptions as of 02/05/2017  Medication Sig Note  . Cholecalciferol (VITAMIN D3) 5000 UNITS TABS Take 1 tablet by mouth daily.   Marland Kitchen doxycycline (VIBRAMYCIN) 100 MG capsule Take 100 mg by mouth 2 (two) times daily. 10 day course starting on 01/23/2017   . FLUoxetine (PROZAC) 20 MG tablet Take 20 mg by  mouth daily.   Marland Kitchen gabapentin (NEURONTIN) 300 MG capsule Take 600 mg by mouth 3 (three) times daily.    Marland Kitchen glucose blood (ONE TOUCH ULTRA TEST) test strip 3 times per day   . Insulin Aspart (NOVOLOG FLEXPEN [place]) Inject 20 Units into the skin 3 (three) times daily.  12/20/2016: Now taking 30 units TID as per MD  . Insulin Degludec (TRESIBA FLEXTOUCH) 200 UNIT/ML SOPN Inject 80 Units into the skin every morning.    . Insulin Pen Needle (BD PEN NEEDLE NANO U/F) 32G X 4 MM MISC 1 each by Does not apply route 4 (four) times daily.   . metFORMIN (GLUCOPHAGE-XR) 500 MG 24 hr tablet Take 500 mg by mouth at bedtime.    . methylPREDNISolone (MEDROL DOSEPAK) 4 MG TBPK tablet Take 4-32 mg by mouth See admin instructions. Take as directed per package instructions (6,5,4,3,2,1)   . nebivolol (BYSTOLIC) 5 MG tablet Take 5 mg by mouth daily.   09/18/2016: Last refill 04/18/2015 #30  . NOVOLIN N RELION 100 UNIT/ML injection Inject 20 Units into the skin 3 (three) times daily before meals.    . ranitidine (ZANTAC) 75 MG tablet Take 75 mg by mouth at bedtime.   . simvastatin (ZOCOR) 40 MG tablet Take 1 tablet (40 mg total) by mouth every evening.   . sitaGLIPtin (JANUVIA) 100 MG tablet Take 100 mg by mouth daily.   . trazodone (DESYREL) 300 MG tablet Take 300 mg by mouth at bedtime.      Assessment:  Knowledge Deficits related to new/acute health condition (new mediastinal  and hilar nodes):  Hannah Schultz has been treated for sinusitis and URI since January by her primary care provider/pulmonologist. Last week, Hannah Schultz was having ongoing acute chest pain with an episode of vomiting that awakened her from sleep at 4am. She also had shortness of breath and profound fatigue. She reported to the ED where she had a workup that included chest xray and chest CT which showed mediastinal nodules. It was recommended that she follow up with her primary care provider and pulmonologist.   Reports ongoing intermittent episodes of  chest discomfort with daily nausea which she is treating with Ondansetron. Also reports persistent dry non productive cough. She denies subjective fever or chills or headache.   She saw Dr. Luan Pulling in the office on Friday. He has ordered a sleep study which is to be performed at [facility] on March 13th and a biopsy of the mediastinal/hilar nodules at [facility] (appt date/time pending).   01/29/17:  CXR Impression as outlined in [place]  "...IMPRESSION: 1. No acute cardiopulmonary disease. 2. Masslike area prominence along the inferior right hilum. Recommend follow-up chest CT with contrast for further assessment."   CT Chest Results as outlined in the MEDICAL RECORD NUMBER  "Mediastinum/Nodes: Enlarged mediastinal and bilateral hilar nodes are seen, measuring up to 1.8 cm at the mediastinum, 2.2 cm at the right hilum and 1.5 cm at the left hilum. Enlarged mediastinal nodes are noted at the right paratracheal, periaortic and subcarinal regions. No pericardial effusion is identified. The thyroid gland contains minimal hypodensities, but is otherwise unremarkable. No axillary lymphadenopathy is seen.   Marland Kitchen.IMPRESSION: 1. Enlarged mediastinal and bilateral hilar nodes, measuring up to 2.2 cm. Enlarged nodes at the upper abdomen, about the celiac trunk. This could reflect lymphoma, sarcoidosis or a variety of inflammatory conditions. The appearance is less typical for metastatic disease. A number of the mediastinal nodes would be amenable to biopsy, as deemed clinically appropriate. 2. Minimal bibasilar atelectasis noted. Lungs otherwise clear."   Chronic Health Condition (DM) - Hannah Schultz has been checking her CBG's TID and her average readingsremain in the 200's. Sheis currently on a medication regimen includingNovolog (Relion) 30u TID, Tresiba 80u daily, Januvia, and Metformin.   HGA1C:  01/18/17 = 10.4 01/31/16 = 8.5 06/25/16 = 14  09/24/16 =  8.0  Fasting cbg today =  241  CBG Averages (02/05/17):    7 day = 194 14 day = 210 30 day = 213 60 day = 234   [facility] Kennard [facility] with the dietician/certified diabetes educator Antonieta Iba. She feels this resource is giving hertools to better manage her health.   Weight Loss Surgery - Mrs. Kwiatek has been discussing weight loss surgery with her primary care provider and endocrinologist and had begun evaluation and planning/information gathering regarding her options for weight loss surgery. These plans are currently on hold while she tries to sort out the results of her CT scan and plan of care. I explained to Mrs. Rick pursuing weight loss surgery at this time will not be possible until her other health concerns are addressed.   Plan:   I will forward cbg monitoring data to Dr. Chalmers Cater today.  Mrs. Springs will call me on Friday if she has not heard from [facility] re: bx order.  Mrs. Solum will call for new or worsened symptoms.  I have scheduled a follow up visit for Mrs. Chaisson for March.  [facility]  [phone]

## 2017-02-12 ENCOUNTER — Other Ambulatory Visit: Payer: Self-pay | Admitting: *Deleted

## 2017-02-12 NOTE — Patient Outreach (Signed)
[facility]) Care Management  02/12/2017  Hannah Schultz 29-Nov-1961 [id number]  I reached out to [name] today to follow up on recent symptoms and work up.   [name] today reports that she is still experiencing intermittent chest pain and discomfort. She continues to have a dry cough. She denies having episodes of syncope or pre-syncope.   [name] says she was contacted by her provider office who told her she was to be scheduled for CT abdomen and pelvis prior to scheduling for biopsy of new nodules found on MRI at a recent ED visit.   Plan: I asked [name] to call her provider office or, if she had any difficulty reaching them, to call me if she has not heard from the radiology department by Friday re: scheduling her CT.   I will plan to see Mrs. Downs at home next pending plans for her diagnostic evaluations.    [facility]  [phone]

## 2017-02-18 ENCOUNTER — Ambulatory Visit: Payer: PPO | Attending: Pulmonary Disease

## 2017-02-18 ENCOUNTER — Other Ambulatory Visit: Payer: Self-pay | Admitting: *Deleted

## 2017-02-18 DIAGNOSIS — G4733 Obstructive sleep apnea (adult) (pediatric): Secondary | ICD-10-CM

## 2017-02-18 NOTE — Patient Outreach (Signed)
[facility]) Care Management  02/18/2017  Hannah Schultz [birth year] [id number]   I reached out to Hannah Schultz to follow up with her related to her scheduled [facility] CM home visit appointment for 02/19/17 at 1100 after noting she is scheduled for a sleep study on today, 02/18/17 at 8 pm and also there is an inclement weather advisory for 02/18/17.    Hannah Schultz confirms she is leaving her home soon to attend the scheduled sleep study for 02/18/17 at 8 pm. She reports that she continues to have intermittent chest pain. [facility] CM  She reports that her providers are completing the sleep study and future CT imaging in preparations for a future "gastric bypass is what they are recommending for me"   Pt preference for [facility] CM appointment is at 1330 on 02/19/17 versus the scheduled 1100 02/19/17 scheduled time.  Offered option of another day but preference is for 02/19/17  Plan: Hannah Schultz to be scheduled for a home visit on 02/19/17 at 1330 as she requested and contacted if there or any changes. Education materials will be provided related to her gastric bypass and questions will be answered.   Hannah Schultz [name], RN, BSN, [facility] [phone]

## 2017-02-19 ENCOUNTER — Other Ambulatory Visit: Payer: Self-pay | Admitting: *Deleted

## 2017-02-19 NOTE — Patient Outreach (Signed)
[facility]) Care Management   02/19/2017  SHERLYNN TOURVILLE October 28, 1961 [id number]  MARCEDES TECH is an 56 y.o. female with medical history of Diabetes Mellitus, Type 2, hyperlipidemia, and hypertension. She was referred to [facility] by her endocrinologist for assistance with medication management. Our pharmacy team has been working closely with Mrs. Frankl and referred her to the nursing team for care coordination and assistance with disease management and education.   Jackelyn Poling RN, BSN - [place] - and I are seeing Mrs. Rembert at home today for a face to face visit to follow up on recent new CT findings, diabetes management, and education around possible weight loss surgery.   Subjective: "I feel better but I still have a cough. My stomach hurts off and on right around my belly button and for about the last month, I can't eat much. I just don't want more than a few bites"  Objective:  BP 130/82   Pulse 61   SpO2 99%   Review of Systems  Constitutional: Positive for weight loss.       Subjective weight loss; has not weighed  HENT: Negative.   Eyes: Negative.   Respiratory: Positive for cough. Negative for sputum production, shortness of breath and wheezing.        Persistent intermittent dry cough  Cardiovascular: Positive for chest pain. Negative for leg swelling.       Persistent mild intermittent chest pain  Gastrointestinal: Negative for constipation, diarrhea, nausea and vomiting.       No change in stool pattern; reports persistent intermittent peri-umbilical pain x 1 month  Genitourinary: Negative.   Musculoskeletal: Negative for falls.  Skin: Negative.   Neurological: Negative.   Psychiatric/Behavioral: Negative.     Physical Exam  Constitutional: She is oriented to person, place, and time. Vital signs are normal. She appears well-developed and well-nourished. She appears listless. She has a sickly appearance. She does not appear  ill.  Cardiovascular: Normal rate, regular rhythm, S1 normal and S2 normal.   Respiratory: Effort normal. She has wheezes. She has no rhonchi. She has rales.  GI: Soft. She exhibits no distension. There is no tenderness. There is no guarding.  Neurological: She is oriented to person, place, and time. She appears listless.  Skin: Skin is warm, dry and intact.  Psychiatric: She has a normal mood and affect. Her speech is normal and behavior is normal. Judgment and thought content normal. Cognition and memory are normal.  Patient states she is fatigued and sleepy today from having been up for a sleep study that was cancelled; she appears tired and sleepy but is engaged and conversant    Encounter Medications:   Outpatient Encounter Prescriptions as of 02/19/2017  Medication Sig Note  . Cholecalciferol (VITAMIN D3) 5000 UNITS TABS Take 1 tablet by mouth daily.   Marland Kitchen doxycycline (VIBRAMYCIN) 100 MG capsule Take 100 mg by mouth 2 (two) times daily. 10 day course starting on 01/23/2017   . FLUoxetine (PROZAC) 20 MG tablet Take 20 mg by mouth daily.   Marland Kitchen gabapentin (NEURONTIN) 300 MG capsule Take 600 mg by mouth 3 (three) times daily.    Marland Kitchen glucose blood (ONE TOUCH ULTRA TEST) test strip 3 times per day   . Insulin Aspart (NOVOLOG FLEXPEN [place]) Inject 20 Units into the skin 3 (three) times daily.  12/20/2016: Now taking 30 units TID as per MD  . Insulin Degludec (TRESIBA FLEXTOUCH) 200 UNIT/ML SOPN Inject 80 Units into  the skin every morning.    . Insulin Pen Needle (BD PEN NEEDLE NANO U/F) 32G X 4 MM MISC 1 each by Does not apply route 4 (four) times daily.   . metFORMIN (GLUCOPHAGE-XR) 500 MG 24 hr tablet Take 500 mg by mouth at bedtime.    . methylPREDNISolone (MEDROL DOSEPAK) 4 MG TBPK tablet Take 4-32 mg by mouth See admin instructions. Take as directed per package instructions (6,5,4,3,2,1)   . nebivolol (BYSTOLIC) 5 MG tablet Take 5 mg by mouth daily.   09/18/2016: Last refill 04/18/2015 #30  . NOVOLIN  N RELION 100 UNIT/ML injection Inject 20 Units into the skin 3 (three) times daily before meals.    . ranitidine (ZANTAC) 75 MG tablet Take 75 mg by mouth at bedtime.   . simvastatin (ZOCOR) 40 MG tablet Take 1 tablet (40 mg total) by mouth every evening.   . sitaGLIPtin (JANUVIA) 100 MG tablet Take 100 mg by mouth daily.   . trazodone (DESYREL) 300 MG tablet Take 300 mg by mouth at bedtime.      Assessment:    Acute Health Condition (peri-umbilical pain and early satiety) - Mrs. Elzey reported today that she has had early satiety for 1 month, only being able to tolerate a few ounces of food at a time. She reports that her appetite has significantly decreased. As well, during the last month, she has had intermittent peri-umbilical pain that is persistent. Her bowel habits are unchanged. She is not having vomiting. She denies fever.   Knowledge Deficits related to new/acute health condition (new mediastinal and hilar nodes):  Mrs. Lanes has been treated for sinusitis and URI since January by her primary care provider/pulmonologist. In late February, Mrs. Oconnor presented to the ED for persistent chest pain which awakened her from slep, dizziness, weakness, shortness of breath, cough, and 1 episode of vomiting. She had a workup in the ED that included chest xray and chest CT which showed mediastinal nodules. Mrs. Blass followed up with Dr. Luan Pulling (primary care/pulmonary) who ordered a sleep study scheduled for last evening. She was given a reminder call in the morning and arrived at the scheduled time but was not able to have the study because of department closure.   Currently, Mrs. Dilling is awaiting a call back re: a biopsy and rescheduling of her sleep study.   Chronic Health Condition (DM) - Mrs. Droge has been checking her CBG's TID and her average readingsremain in the 200's. Sheis currently on a medication regimen includingNovolog (Relion) 30u TID, Tresiba 80u daily, Januvia, and  Metformin.   HGA1C:  01/18/17 = 10.4 01/31/16 = 8.5 06/25/16 = 14  09/24/16 = 8.0  Fasting cbg today = 122  Weight Loss Surgery - Mrs. Mccleave has been discussing weight loss surgery with her primary care provider and endocrinologist and had begun evaluation and planning/information gathering regarding her options for weight loss surgery. These plans are currently on hold while she tries to sort out the results of her CT scan and plan of care. Ms. Jackelyn Poling RN, BSN was present for our visit today and expertise in weight loss surgery. Ms. Lavina Hamman was able to address some questions for Mrs. Arnold, explain that her new health condition related to mediastinal nodules would need to be addressed before further pursuing weight loss surgery, and reviewed some of the prerequisites for weight loss surgery.   Plan:   I will follow up with the radiology department re: scheduling of biopsy and will follow up  with the sleep disorders center re: rescheduling her sleep study.  Mrs Starkes will call for new or worsened symptoms.   Mrs. Sebek is scheduled for a face to face visit at her home again next week.   [facility] CM Care Plan Problem One   Flowsheet Row Most Recent Value  Care Plan Problem One  Knowledge Deficit related to self health management of Diabetes Mellitus, Type 2  Role Documenting the Problem One  Care Management [place] for Problem One  Active  [facility]N Long Term Goal (31-90 days)  Over the next 60 days, patient will verbalize understanding of changes to diet, medication, and exercise regimen as recommended by her endocrinologist and CDE/RD  [facility] Long Term Goal Start Date  01/25/17  Interventions for Problem One Long Term Goal  Utilizing teachback method, discussed at length most recent changes to DIabetes treatment plan changes  [facility] CM Short Term Goal #1 (0-30 days)  Over the next 7 days,  patient will impletment medication dose change as prescribed and will notify provider with  any side effects or cbg findings outside established parameters  [facility]N CM Short Term Goal #1 Start Date  01/25/17  [facility] CM Short Term Goal #1 Met Date  02/05/17  Interventions for Short Term Goal #1  medications reviewed w/ changes to insulin dose  [facility]N CM Short Term Goal #2 (0-30 days)  Over the next 30 days patient will verbaliize understanding of possible weight reduction surgery as strategy for management of DM  [facility] CM Short Term Goal #2 Start Date  01/25/17  Interventions for Short Term Goal #2  discussed with patient by phone recommendations as made by PCP/PULM?ENDO,  scheudled visit with patient and plan for education re: weight loss surgery    [facility] CM Care Plan Problem Two   Flowsheet Row Most Recent Value  Care Plan Problem Two  acute health condition-chest pain requiring ED visit  Role Documenting the Problem Two  Care Management Coordinator  Care Plan for Problem Two  Active  Interventions for Problem Two Long Term Goal   brief pt examination in home recommended and facilitated ED visit  THN Long Term Goal (31-90) days  over the next 31 days pt will verbalize understnding of plan of care for management of acute chest pain  THN Long Term Goal Start Date  01/29/17  [facility] CM Short Term Goal #1 (0-30 days)  Over the next 7 days, patient will see pcp/pulmonary provider  as recommended  THN CM Short Term Goal #1 Start Date  01/30/17  [facility] CM Short Term Goal #1 Met Date   02/12/17  Interventions for Short Term Goal #2   assisted patient with pcp/pulmonary provider appointment scheduling,  update not to PCP    [facility] CM Care Plan Problem Three   Flowsheet Row Most Recent Value  Care Plan Problem Three  Acute Health Condition (peri-umbilical pain and early satiety)  Role Documenting the Problem Three  Care Management Coordinator  Care Plan for Problem Three  Active  THN Long Term Goal (31-90) days  Over the next 31 days, patient will verbalize understsanding of plan of care for acute health  condition  [facility]N Long Term Goal Start Date  02/19/17  Interventions for Problem Three Long Term Goal  Discussed new symptoms and forwarded assessment information to PCP      [place] [facility]  [phone]

## 2017-02-22 ENCOUNTER — Other Ambulatory Visit (HOSPITAL_COMMUNITY): Payer: Self-pay | Admitting: Pulmonary Disease

## 2017-02-22 DIAGNOSIS — J9859 Other diseases of mediastinum, not elsewhere classified: Secondary | ICD-10-CM

## 2017-02-28 ENCOUNTER — Ambulatory Visit: Payer: Commercial Managed Care - HMO | Admitting: *Deleted

## 2017-02-28 ENCOUNTER — Other Ambulatory Visit (HOSPITAL_BASED_OUTPATIENT_CLINIC_OR_DEPARTMENT_OTHER): Payer: Self-pay

## 2017-02-28 ENCOUNTER — Other Ambulatory Visit: Payer: Self-pay | Admitting: *Deleted

## 2017-02-28 DIAGNOSIS — G4730 Sleep apnea, unspecified: Secondary | ICD-10-CM

## 2017-02-28 NOTE — Patient Instructions (Signed)
Canagliflozin; Metformin oral tablets What is this medicine? CANAGLIFLOZIN; METFORMIN (KAN a gli FLOE zin; met FOR min) is a combination of 2 medicines used to treat type 2 diabetes. This medicine lowers blood sugar. Treatment is combined with a balanced diet and exercise. This medicine may be used for other purposes; ask your health care provider or pharmacist if you have questions. COMMON BRAND NAME(S): Invokamet How should I use this medicine? Take this medicine by mouth with a glass of water. Take this medicine with food. Follow the directions on the prescription label. Take your doses at regular intervals. Do not take your medicine more often than directed. Do not stop taking except on your doctor's advice. A special MedGuide will be given to you by the pharmacist with each prescription and refill. Be sure to read this information carefully each time. Talk to your pediatrician regarding the use of this medicine in children. Special care may be needed. Overdosage: If you think you have taken too much of this medicine contact a poison control center or emergency room at once. NOTE: This medicine is only for you. Do not share this medicine with others. What if I miss a dose? If you miss a dose, take it as soon as you can. If it is almost time for your next dose, take only that dose. Do not take double or extra doses. This list may not describe all possible interactions. Give your health care provider a list of all the medicines, herbs, non-prescription drugs, or dietary supplements you use. Also tell them if you smoke, drink alcohol, or use illegal drugs. Some items may interact with your medicine. What should I watch for while using this medicine? Visit your doctor or health care professional for regular checks on your progress. This medicine can cause a serious condition in which there is too much acid in the blood. If you develop nausea, vomiting, stomach pain, unusual tiredness, or breathing  problems, stop taking this medicine and call your doctor right away. If possible, use a ketone dipstick to check for ketones in your urine. A test called the HbA1C (A1C) will be monitored. This is a simple blood test. It measures your blood sugar control over the last 2 to 3 months. You will receive this test every 3 to 6 months. Learn how to check your blood sugar. Learn the symptoms of low and high blood sugar and how to manage them. Always carry a quick-source of sugar with you in case you have symptoms of low blood sugar. Examples include hard sugar candy or glucose tablets. Make sure others know that you can choke if you eat or drink when you develop serious symptoms of low blood sugar, such as seizures or unconsciousness. They must get medical help at once. Tell your doctor or health care professional if you have high blood sugar. You might need to change the dose of your medicine. If you are sick or exercising more than usual, you might need to change the dose of your medicine. Do not skip meals. Ask your doctor or health care professional if you should avoid alcohol. Many nonprescription cough and cold products contain sugar or alcohol. These can affect blood sugar. This medicine may cause ovulation in premenopausal women who do not have regular monthly periods. This may increase your chances of becoming pregnant. You should not take this medicine if you become pregnant or think you may be pregnant. Talk with your doctor or health care professional about your birth control options while  taking this medicine. Contact your doctor or health care professional right away if think you are pregnant. If you are going to need surgery, a MRI, CT scan, or other procedure, tell your doctor that you are taking this medicine. You may need to stop taking this medicine before the procedure. Wear a medical ID bracelet or chain, and carry a card that describes your disease and details of your medicine and dosage  times. What side effects may I notice from receiving this medicine? Side effects that you should report to your doctor or health care professional as soon as possible: -allergic reactions like skin rash, itching or hives, swelling of the face, lips, or tongue -breathing problems -chest pain -dizziness -feeling faint or lightheaded, falls -muscle aches or pains -muscle weakness -nausea, vomiting, unusual stomach upset or pain -new pain or tenderness, change in skin color, sores or ulcers, or infection in legs or feet -signs and symptoms of low blood sugar such as feeling anxious, confusion, dizziness, increased hunger, unusually weak or tired, sweating, shakiness, cold, irritable, headache, blurred vision, fast heartbeat, loss of consciousness -signs and symptoms of a urinary tract infection, such as fever, chills, a burning feeling when urinating, blood in the urine, back pain -trouble passing urine or change in the amount of urine, including an urgent need to urinate more often, in larger amounts, or at night -penile discharge, itching, or pain in men -slow, irregular heartbeat -unusually tired or weak -vaginal discharge, itching, or odor in women Side effects that usually do not require medical attention (report to your doctor or health care professional if they continue or are bothersome): -constipation -diarrhea -headache -heartburn -mild increase in urination -stomach gas -thirsty This list may not describe all possible side effects. Call your doctor for medical advice about side effects. You may report side effects to FDA at 1-800-FDA-1088. Where should I keep my medicine? Keep out of the reach of children. Store at room temperature between 15 and 30 degrees C (59 and 86 degrees F). Throw away any unused medicine after the expiration date. NOTE: This sheet is a summary. It may not cover all possible information. If you have questions about this medicine, talk to your doctor,  pharmacist, or health care provider.  2018 Elsevier/Gold Standard (2016-04-24 14:24:27)

## 2017-02-28 NOTE — Patient Outreach (Addendum)
[facility]) Care Management   02/28/2017  Hannah Schultz 1961-02-28 [id number]  Hannah Schultz is an 56 y.o. female being followed by [facility] CM for care coordination and education related DM management and has been seen previously by [facility] for medication assistance.   Hannah Schultz has a PMH of DM Type II, hypertension, hyperlipidemia, carpal tunnel syndrome, and trigger thumb of right hand.    She has not had an admissions but 1 ED visit in the last 6 months.  Her ED visit was on 01/29/17 for nonspecific chest pain and mediastinal adenopathy with recommendation for follow up with pcp for the evaluation of the 01/29/17 abnormal Chest CT imaging that showed "Enlarged mediastinal and bilateral hilar nodes, measuring up to 2.2 cm. Enlarged nodes at the upper abdomen, about the celiac trunk. This could reflect lymphoma, sarcoidosis or a variety of inflammatory conditions. The appearance is less typical for metastatic disease. A number of the mediastinal nodes would be amenable to biopsy, as deemed clinically appropriate."  Plus "minimal bibasilar atelectasis noted.  Lungs otherwise clear."    Subjective: When [facility] CM inquired about Mr Meller knowledge of the chest CT result she reports she was informed "They haven't told me anything except that they found a spot" "I am tired and have been sick lately"  Objective:  BP 120/78 (BP Location: Left Arm, Patient Position: Sitting, Cuff Size: Large)   Pulse 66   SpO2 98% Comment: initially pt O2 sat captured at 86% which increased to 98%   Review of Systems  Constitutional: Positive for malaise/fatigue and weight loss. Negative for chills, diaphoresis and fever.       Reports weight loss but does not know wt value Reports noted change in clothes, Tired, poor sleep pattern, bad dreams  HENT: Negative.  Negative for congestion, ear discharge, ear pain, hearing loss, nosebleeds, sinus pain, sore throat and tinnitus.   Eyes: Negative.   Negative for blurred vision, double vision, photophobia, pain, discharge and redness.  Respiratory: Positive for cough. Negative for hemoptysis, sputum production, wheezing and stridor.        Noted dry non productive cough with left lung crackles  Cardiovascular: Positive for chest pain and leg swelling. Negative for claudication.  Gastrointestinal: Positive for abdominal pain, diarrhea, heartburn and nausea. Negative for blood in stool, constipation, melena and vomiting.  Genitourinary: Positive for frequency. Negative for dysuria, flank pain, hematuria and urgency.       Frequency noted with road trip to beach recently; sitting in car  Musculoskeletal: Negative.  Negative for back pain, falls, joint pain, myalgias and neck pain.       Pain in legs burning and aching legs collapse under her at knee over a yr  Skin: Negative.  Negative for itching and rash.  Neurological: Positive for tingling. Negative for dizziness, sensory change, speech change, focal weakness, seizures, weakness and headaches.  Endo/Heme/Allergies: Negative.  Negative for polydipsia. Does not bruise/bleed easily.  Psychiatric/Behavioral: Negative.  Negative for depression, memory loss and suicidal ideas. The patient is not nervous/anxious and does not have insomnia.     Physical Exam  Constitutional: She is oriented to person, place, and time. She appears well-developed and well-nourished.  HENT:  Head: Normocephalic and atraumatic.  Right Ear: External ear normal.  Left Ear: External ear normal.  Nose: Nose normal.  Mouth/Throat: Oropharynx is clear and moist.  Eyes: EOM are normal. Pupils are equal, round, and reactive to light.  Neck: Normal range of motion.  Neck supple.  Respiratory: Effort normal.  GI: Soft. Bowel sounds are normal.  Musculoskeletal: Normal range of motion.  Neurological: She is alert and oriented to person, place, and time.  Skin: Skin is warm and dry.    Encounter Medications:    Outpatient Encounter Prescriptions as of 02/28/2017  Medication Sig Note  . Cholecalciferol (VITAMIN D3) 5000 UNITS TABS Take 1 tablet by mouth daily.   Marland Kitchen FLUoxetine (PROZAC) 20 MG tablet Take 20 mg by mouth daily.   Marland Kitchen gabapentin (NEURONTIN) 300 MG capsule Take 600 mg by mouth 3 (three) times daily.    Marland Kitchen glucose blood (ONE TOUCH ULTRA TEST) test strip 3 times per day   . Insulin Aspart (NOVOLOG FLEXPEN [place]) Inject 20 Units into the skin 3 (three) times daily.  12/20/2016: Now taking 30 units TID as per MD  . Insulin Degludec (TRESIBA FLEXTOUCH) 200 UNIT/ML SOPN Inject 80 Units into the skin every morning.    . Insulin Pen Needle (BD PEN NEEDLE NANO U/F) 32G X 4 MM MISC 1 each by Does not apply route 4 (four) times daily.   . nebivolol (BYSTOLIC) 5 MG tablet Take 5 mg by mouth daily.   09/18/2016: Last refill 04/18/2015 #30  . NOVOLIN N RELION 100 UNIT/ML injection Inject 20 Units into the skin 3 (three) times daily before meals.    . ranitidine (ZANTAC) 75 MG tablet Take 75 mg by mouth at bedtime.   . simvastatin (ZOCOR) 40 MG tablet Take 1 tablet (40 mg total) by mouth every evening.   . sitaGLIPtin (JANUVIA) 100 MG tablet Take 100 mg by mouth daily.   . trazodone (DESYREL) 300 MG tablet Take 300 mg by mouth at bedtime.     Marland Kitchen doxycycline (VIBRAMYCIN) 100 MG capsule Take 100 mg by mouth 2 (two) times daily. 10 day course starting on 01/23/2017   . metFORMIN (GLUCOPHAGE-XR) 500 MG 24 hr tablet Take 500 mg by mouth at bedtime.  02/28/2017: Clarification not taking as ordered last about a week ago  . methylPREDNISolone (MEDROL DOSEPAK) 4 MG TBPK tablet Take 4-32 mg by mouth See admin instructions. Take as directed per package instructions (6,5,4,3,2,1)    No facility-administered encounter medications on file as of 02/28/2017.      Assessment:    Acute Health Condition Hannah Schultz was noted to be sitting at her dining room table during this [facility] CM home visit. She appeared to look tired and when  questioned she confirmed she was tired and had not been sleeping well ("bad dreams" "Roland had to wake me up").  She and her husband reports recent sickness during a weekend trip to [facility].  GI issues- Reports issues with abdominal symptoms (pain, diarrhea, nausea, heartburn), urinary frequency during drive to beach and swelling of lower extremities.  Hannah Schultz voiced concerns about use of Metformin 500 mg that she has been taking since 01/25/17 She reports that she stopped Metformin about "one week ago" related to GI side effects (nausea, diarrhea)  [facility] CM discussed CT of the abdomin/pelvic will assist with finding acute GI illness and Metformin uses, side effects, best practices to taking it to reduce GI side effects.  Encouraged Hannah Schultz to restart her Metformin using these recommendations and to keep a record of her side effects to share with her endocrinologist or pcp in order for them to decide on treatment plan for Metformin.  Lower extremity swelling- Discussed the use of compression stocking (with possible evaluation of correct DME by  [employer]) to assist with swelling of lower extremities during long trips.    Hannah Schultz reports loss of weight but unable to provide a weight value at this time.  She reports noting changes in the fit of her clothes.    Chronic Health Condition (DM, Hypertension, hyperlipidemia) [facility] CM discussed with Hannah Schultz a visit to Dr Luan Pulling office prior to this home visit to review her concerns (need to complete Sleep study at [facility], abnormal chest CT/cough, chest pain concerns, gastric bypass-pending upcoming tests/acute illnesses)  She reports receiving calls from [facility] about her sleep study concerns and 03/01/17 scheduled sleep study.  Hannah Schultz reports "I'm enjoying reading the book on gastric bypass" (CM left a copy of weight loss surgery for dummies for pt during 02/19/17 home visit)  Her DM, hypertension and hyperlipidemia are being  managed with medications and diet.   HGA1C:  01/18/17 = 10.4 01/31/16 = 8.5 06/25/16 = 14  09/24/16 = 8.0  Fasting cbg today = 107 at 1030 am today with a 7 day average of 116 and a 30 day average of 166 Noted recent low cbg to 70 and high cbg in the 100s  BP  02/28/17 120/78   02/19/17 130/82   02/05/17 114/78   01/30/17 total cholesterol 181 HDL 49 LDL 109 triglycerides 113   Hannah Schultz recently started taking vitamin D 500 mg ordered by her endocrinologist, Bindubal Balan. She was assisted with suggestions for finding this OTC medicine at local pharmacies like [place] [facility] and [place] to purchase the correct dose of vitamin D   Confirmed with Hannah Schultz that she is scheduled for a sleep study on March 01, 2017 at 8 pm at [facility] in [place] after experiencing some difficulties with completing this test.  She is also scheduled for a CT of the abdomin and pelvic appointment on 03/06/17 at [place]:  Hannah Schultz will continue to review weight loss surgery book and the EMMIs articles provided to her today (DIABETIC NEUROPATHY, DIABETES: CONTROLLING BLOOD SUGAR, ROUX-EN-Y GASTRIC BYPASS, OPEN SURGERY- to be mailed information on Metformin usage from [facility])  Hannah Schultz's pcp, Dr Luan Pulling and endocrinologist, Dr Chalmers Cater will be updated on her progress/concerns via fax and/or EPIC in basket and a request for possible assist for referral to be fitted for DME (compression stockings) will be completed   Hannah Schultz will be seen for the next scheduled home visit on 03/14/17 at [place] Problem One     Most Recent Value  Care Plan Problem One  Knowledge Deficit related to self health management of Diabetes Mellitus, Type 2  Role Documenting the Problem One  Care Management [place] for Problem One  Active  [facility] Long Term Goal (31-90 days)  Over the next 60 days, patient will verbalize understanding of changes to diet, medication, and  exercise regimen as recommended by her endocrinologist and CDE/RD  [facility] Long Term Goal Start Date  01/25/17  Interventions for Problem One Long Term Goal  Utilizing teachback method, discussed at length most recent changes to DIabetes treatment plan changes  [facility] CM Short Term Goal #1 (0-30 days)  Over the next 7 days,  patient will impletment medication dose change as prescribed and will notify provider with any side effects or cbg findings outside established parameters  [facility] CM Short Term Goal #1 Start Date  01/25/17  [facility] CM Short Term Goal #1 Met  Date  02/05/17  Interventions for Short Term Goal #1  medications reviewed w/ changes to insulin dose  [facility] CM Short Term Goal #2 (0-30 days)  Over the next 30 days patient will verbaliize understanding of possible weight reduction surgery as strategy for management of DM  [facility] CM Short Term Goal #2 Start Date  01/25/17  Interventions for Short Term Goal #2  discussed with patient by phone recommendations as made by PCP/PULM?ENDO,  scheudled visit with patient and plan for education re: weight loss surgery, EMMI article, wt loss surgery book for pt to review    [facility] CM Care Plan Problem Two     Most Recent Value  Care Plan Problem Two  acute health condition-chest pain requiring ED visit  Role Documenting the Problem Two  Care Management Coordinator  Care Plan for Problem Two  Active  Interventions for Problem Two Long Term Goal   brief pt examination in home recommended and facilitated ED visit  [facility] Long Term Goal (31-90) days  over the next 45 days pt will verbalize understanding of plan of care for management of acute chest pain, leg swelling and GI symptoms  [facility] Long Term Goal Start Date  01/29/17  [facility] CM Short Term Goal #1 (0-30 days)  Over the next 7 days, patient will see pcp/pulmonary provider  as recommended  [facility] CM Short Term Goal #1 Start Date  01/30/17  [facility] CM Short Term Goal #1 Met Date   02/12/17  Interventions for Short Term Goal #2    assisted patient with pcp/pulmonary provider appointment scheduling,  update not to PCP    [facility] CM Care Plan Problem Three     Most Recent Value  Care Plan Problem Three  Acute Health Condition (peri-umbilical pain and early satiety)  Role Documenting the Problem Three  Care Management [place] for Problem Three  Active  [facility] Long Term Goal (31-90) days  Over the next 45 days, patient will verbalize understsanding of plan of care for acute health condition  [facility] Long Term Goal Start Date  02/19/17  Interventions for Problem Three Long Term Goal  Discussed new symptoms and forwarded assessment information to PCP      Joelene Millin [name], RN, BSN, [place] Care Management [phone]

## 2017-03-01 ENCOUNTER — Encounter: Payer: Self-pay | Admitting: *Deleted

## 2017-03-01 ENCOUNTER — Ambulatory Visit (HOSPITAL_BASED_OUTPATIENT_CLINIC_OR_DEPARTMENT_OTHER): Payer: PPO | Attending: Pulmonary Disease | Admitting: Internal Medicine

## 2017-03-01 DIAGNOSIS — R0683 Snoring: Secondary | ICD-10-CM | POA: Insufficient documentation

## 2017-03-01 DIAGNOSIS — G4733 Obstructive sleep apnea (adult) (pediatric): Secondary | ICD-10-CM | POA: Diagnosis not present

## 2017-03-01 DIAGNOSIS — G4730 Sleep apnea, unspecified: Secondary | ICD-10-CM | POA: Diagnosis not present

## 2017-03-06 ENCOUNTER — Ambulatory Visit (HOSPITAL_COMMUNITY)
Admission: RE | Admit: 2017-03-06 | Discharge: 2017-03-06 | Disposition: A | Discharge status: Home / Self Care | Payer: PPO | Source: Ambulatory Visit | Attending: Pulmonary Disease | Admitting: Pulmonary Disease

## 2017-03-06 ENCOUNTER — Encounter (HOSPITAL_COMMUNITY): Payer: Self-pay

## 2017-03-06 DIAGNOSIS — R109 Unspecified abdominal pain: Secondary | ICD-10-CM | POA: Diagnosis not present

## 2017-03-06 DIAGNOSIS — R161 Splenomegaly, not elsewhere classified: Secondary | ICD-10-CM | POA: Insufficient documentation

## 2017-03-06 DIAGNOSIS — J9859 Other diseases of mediastinum, not elsewhere classified: Secondary | ICD-10-CM

## 2017-03-06 DIAGNOSIS — R590 Localized enlarged lymph nodes: Secondary | ICD-10-CM | POA: Insufficient documentation

## 2017-03-06 DIAGNOSIS — D735 Infarction of spleen: Secondary | ICD-10-CM | POA: Insufficient documentation

## 2017-03-06 MED ORDER — IOPAMIDOL (ISOVUE-300) INJECTION 61%
30.0000 mL | Freq: Once | INTRAVENOUS | Status: AC | PRN
  Administered 2017-03-06: 30 mL via ORAL

## 2017-03-06 MED ORDER — IOPAMIDOL (ISOVUE-300) INJECTION 61%
INTRAVENOUS | Status: AC
  Administered 2017-03-06: 30 mL via ORAL
  Filled 2017-03-06: qty 30

## 2017-03-06 MED ORDER — IOPAMIDOL (ISOVUE-300) INJECTION 61%
INTRAVENOUS | Status: AC
  Administered 2017-03-06: 100 mL
  Filled 2017-03-06: qty 100

## 2017-03-08 ENCOUNTER — Other Ambulatory Visit (HOSPITAL_COMMUNITY): Payer: Self-pay | Admitting: Pulmonary Disease

## 2017-03-08 DIAGNOSIS — G4730 Sleep apnea, unspecified: Secondary | ICD-10-CM | POA: Diagnosis not present

## 2017-03-08 DIAGNOSIS — R599 Enlarged lymph nodes, unspecified: Secondary | ICD-10-CM

## 2017-03-08 NOTE — Procedures (Signed)
Patient Name: Hannah Schultz, Hannah Schultz Date: 03/01/2017 Gender: Female D.O.B: 09/23/1961 Age (years): 55 Referring Provider: Lennox Solders Height (inches): 23 Interpreting Physician: Baird Lyons MD, ABSM Weight (lbs): 277 RPSGT: Baxter Flattery BMI: 2 MRN: [id number] Neck Size: 21.00 CLINICAL INFORMATION Sleep Study Type: Split Night CPAP  Indication for sleep study: Excessive Daytime Sleepiness, Fatigue, OSA, Snoring, Witnessed Apneas  Epworth Sleepiness Score: 16  SLEEP STUDY TECHNIQUE As per the AASM Manual for the Scoring of Sleep and Associated Events v2.3 (April 2016) with a hypopnea requiring 4% desaturations.  The channels recorded and monitored were frontal, central and occipital EEG, electrooculogram (EOG), submentalis EMG (chin), nasal and oral airflow, thoracic and abdominal wall motion, anterior tibialis EMG, snore microphone, electrocardiogram, and pulse oximetry. Continuous positive airway pressure (CPAP) was initiated when the patient met split night criteria and was titrated according to treat sleep-disordered breathing.  MEDICATIONS Medications self-administered by patient taken the night of the study : N/A  RESPIRATORY PARAMETERS Diagnostic  Total AHI (/hr): 81.6 RDI (/hr): 89.9 OA Index (/hr): 41.2 CA Index (/hr): 0.0 REM AHI (/hr): 114.1 NREM AHI (/hr): 77.3 Supine AHI (/hr): N/A Non-supine AHI (/hr): 81.61 Min O2 Sat (%): 66.00 Mean O2 (%): 91.75 Time below 88% (min): 20.1   Titration  Optimal Pressure (cm): 17 AHI at Optimal Pressure (/hr): 0.0 Min O2 at Optimal Pressure (%): 92.0 Supine % at Optimal (%): 100 Sleep % at Optimal (%): 100   SLEEP ARCHITECTURE The recording time for the entire night was 402.1 minutes.  During a baseline period of 200.3 minutes, the patient slept for 173.5 minutes in REM and nonREM, yielding a sleep efficiency of 86.6%. Sleep onset after lights out was 5.9 minutes with a REM latency of 155.0 minutes. The patient spent  3.17% of the night in stage N1 sleep, 85.01% in stage N2 sleep, 0.00% in stage N3 and 11.82% in REM.  During the titration period of 193.8 minutes, the patient slept for 193.7 minutes in REM and nonREM, yielding a sleep efficiency of 99.9%. Sleep onset after CPAP initiation was 0.2 minutes with a REM latency of 98.0 minutes. The patient spent 0.26% of the night in stage N1 sleep, 84.51% in stage N2 sleep, 0.00% in stage N3 and 15.23% in REM.  CARDIAC DATA The 2 lead EKG demonstrated sinus rhythm. The mean heart rate was 72.32 beats per minute. Other EKG findings include: None.  LEG MOVEMENT DATA The total Periodic Limb Movements of Sleep (PLMS) were 0. The PLMS index was 0.00 .  IMPRESSIONS - Severe obstructive sleep apnea occurred during the diagnostic portion of the study (AHI = 81.6/hour). An optimal PAP pressure was selected for this patient ( 17 cm of water) - No significant central sleep apnea occurred during the diagnostic portion of the study (CAI = 0.0/hour). - Moderate oxygen desaturation was noted during the diagnostic portion of the study (Min O2 =66.00%). - No snoring was audible during the diagnostic portion of the study. - No cardiac abnormalities were noted during this study. - Clinically significant periodic limb movements did not occur during sleep.  DIAGNOSIS - Obstructive Sleep Apnea (327.23 [G47.33 ICD-10])  RECOMMENDATIONS - Trial of CPAP therapy on 17 cm H2O with a Medium size Resmed Full Face Mask AirFit F20 mask and heated humidification. - Avoid alcohol, sedatives and other CNS depressants that may worsen sleep apnea and disrupt normal sleep architecture. - Sleep hygiene should be reviewed to assess factors that may improve sleep quality. - Weight management and regular exercise  should be initiated or continued.  [Electronically signed] 03/08/2017 10:34 AM  Baird Lyons MD, ABSM Diplomate, American Board of Sleep Medicine   NPI: [id number]  [place], American Board of Sleep Medicine  ELECTRONICALLY SIGNED ON:  03/08/2017, 10:34 AM [place] PH: [phone]   FX: [phone] [place]

## 2017-03-11 DIAGNOSIS — E7800 Pure hypercholesterolemia, unspecified: Secondary | ICD-10-CM | POA: Diagnosis not present

## 2017-03-11 DIAGNOSIS — E1165 Type 2 diabetes mellitus with hyperglycemia: Secondary | ICD-10-CM | POA: Diagnosis not present

## 2017-03-11 DIAGNOSIS — I10 Essential (primary) hypertension: Secondary | ICD-10-CM | POA: Diagnosis not present

## 2017-03-14 ENCOUNTER — Other Ambulatory Visit: Payer: Self-pay | Admitting: *Deleted

## 2017-03-14 ENCOUNTER — Ambulatory Visit: Payer: Self-pay | Admitting: *Deleted

## 2017-03-15 ENCOUNTER — Telehealth: Payer: Self-pay | Admitting: *Deleted

## 2017-03-15 NOTE — Patient Outreach (Addendum)
[facility]) Care Management   03/15/2017  Hannah Schultz 11/25/1961 [id number]  Hannah Schultz is an 56 y.o. female being followed by [facility] CM for care coordination and education related DM/HTN management and has been seen previously by [place] for medication assistance.  Hannah Schultz has a PMH of DM Type II, hypertension, hyperlipidemia, carpal tunnel syndrome, and trigger thumb of right hand.    She has not had an admissions but 1 ED visit in the last 6 months.  Her ED visit was on 01/29/17 for nonspecific chest pain and mediastinal adenopathy with recommendation for follow up with pcp for the evaluation of the 01/29/17 abnormal Chest CT imaging that showed "Enlarged mediastinal and bilateral hilar nodes, measuring up to 2.2 cm. Enlarged nodes at the upper abdomen, about the celiac trunk. This could reflect lymphoma, sarcoidosis or a variety of inflammatory conditions. The appearance is less typical for metastatic disease. A number of the mediastinal nodes would be amenable to biopsy, as deemed clinically appropriate."  Plus "minimal bibasilar atelectasis noted. Lungs otherwise clear."  Subjective: "I've been tired lately" " I saw Dr Chalmers Cater who said she has not heard from anyone" "I would like to know about my test results"  Objective:   BP 136/74 Pulse Rate 70  [facility] indicates a 1:30 pm appointment on 03/11/17 to Dr Chalmers Cater with a BMI of 43.96 (Obese Class III) wt =264 lb (120 kg) Ht 65 in (165 cm)  [facility] on 01/29/17 shows a recorded wt was 272 lb This indicates a loss of 8 lbs since 01/29/17   Review of Systems  Constitutional: Positive for malaise/fatigue and weight loss. Negative for chills, diaphoresis and fever.  HENT: Positive for congestion. Negative for ear discharge, hearing loss, nosebleeds, sore throat and tinnitus.   Eyes: Negative.  Negative for blurred vision, double vision, photophobia, pain, discharge and redness.  Respiratory: Positive for cough. Negative for  hemoptysis, sputum production, shortness of breath and wheezing.   Cardiovascular: Positive for chest pain and leg swelling. Negative for palpitations, orthopnea and claudication.  Gastrointestinal: Positive for abdominal pain, heartburn and nausea. Negative for blood in stool, constipation, diarrhea, melena and vomiting.  Genitourinary: Negative.  Negative for dysuria, flank pain, frequency, hematuria and urgency.  Musculoskeletal: Positive for myalgias. Negative for back pain, falls, joint pain and neck pain.  Skin: Negative.  Negative for itching and rash.  Neurological: Positive for dizziness. Negative for tingling, tremors, sensory change, speech change, focal weakness, seizures, loss of consciousness, weakness and headaches.  Endo/Heme/Allergies: Negative.  Negative for environmental allergies and polydipsia. Does not bruise/bleed easily.  Psychiatric/Behavioral: Negative.  Negative for depression, hallucinations, memory loss, substance abuse and suicidal ideas. The patient is not nervous/anxious and does not have insomnia.     Physical Exam  Constitutional: She is oriented to person, place, and time. She appears well-developed and well-nourished.  HENT:  Head: Normocephalic and atraumatic.  Eyes: Conjunctivae are normal. Pupils are equal, round, and reactive to light.  Neck: Normal range of motion. Neck supple.  Cardiovascular: Normal rate, regular rhythm, normal heart sounds and intact distal pulses.   Respiratory: Effort normal and breath sounds normal.  GI: Soft. Bowel sounds are normal.  Musculoskeletal: Normal range of motion.  Neurological: She is alert and oriented to person, place, and time.  Skin: Skin is warm and dry.  Psychiatric: Her behavior is normal. Judgment and thought content normal. Cognition and memory are normal.    Encounter Medications:   Outpatient Encounter Prescriptions  as of 03/14/2017  Medication Sig Note  . Cholecalciferol (VITAMIN D3) 5000 UNITS TABS  Take 1 tablet by mouth daily.   Marland Kitchen doxycycline (VIBRAMYCIN) 100 MG capsule Take 100 mg by mouth 2 (two) times daily. 10 day course starting on 01/23/2017   . FLUoxetine (PROZAC) 20 MG tablet Take 20 mg by mouth daily.   Marland Kitchen gabapentin (NEURONTIN) 300 MG capsule Take 600 mg by mouth 3 (three) times daily.    Marland Kitchen glucose blood (ONE TOUCH ULTRA TEST) test strip 3 times per day   . Insulin Aspart (NOVOLOG FLEXPEN [place]) Inject 20 Units into the skin 3 (three) times daily.  12/20/2016: Now taking 30 units TID as per MD  . Insulin Degludec (TRESIBA FLEXTOUCH) 200 UNIT/ML SOPN Inject 80 Units into the skin every morning.    . Insulin Pen Needle (BD PEN NEEDLE NANO U/F) 32G X 4 MM MISC 1 each by Does not apply route 4 (four) times daily.   . metFORMIN (GLUCOPHAGE-XR) 500 MG 24 hr tablet Take 500 mg by mouth at bedtime.  02/28/2017: Clarification not taking as ordered last about a week ago  . methylPREDNISolone (MEDROL DOSEPAK) 4 MG TBPK tablet Take 4-32 mg by mouth See admin instructions. Take as directed per package instructions (6,5,4,3,2,1)   . nebivolol (BYSTOLIC) 5 MG tablet Take 5 mg by mouth daily.   09/18/2016: Last refill 04/18/2015 #30  . NOVOLIN N RELION 100 UNIT/ML injection Inject 20 Units into the skin 3 (three) times daily before meals.    . ranitidine (ZANTAC) 75 MG tablet Take 75 mg by mouth at bedtime.   . simvastatin (ZOCOR) 40 MG tablet Take 1 tablet (40 mg total) by mouth every evening.   . sitaGLIPtin (JANUVIA) 100 MG tablet Take 100 mg by mouth daily.   . trazodone (DESYREL) 300 MG tablet Take 300 mg by mouth at bedtime.      No facility-administered encounter medications on file as of 03/14/2017.       Assessment:    Acute Health Condition Hannah Schultz was noted to be lying down on the sofa in her living room and came to sit at her dining room table during this [facility] CM home visit. She appeared to look sleepy and when questioned she confirmed she was tired and still had not been sleeping well.  She continues to report not eating well and states she has lost "twelve pounds"  She is unable to share what her weight is at this time and even with the encouragement of her husband she would not get up from the dining room table to weigh on her home scales.   She reports noting changes in the fit of her clothes. Her Husband, Paulo Fruit, voices concern about Hannah Rotz related to appetite and disinterest in activity. He is a good support and voices encouragement to her.  He informs [facility] CM outside of the home that he does his best to encourage her to remain active and to eat.  He voices concern also that she wants to know her test results.   She confirms receipt of education resources sent to her by [facility] CM in mail. She discussed her office visit to Dr Chalmers Cater and reports Dr Chalmers Cater had not received information from pcp about "test results" CM inquired if Dr Chalmers Cater had access to [facility] or [facility] but Hannah Killman is unaware.  [facility] CM informed Hannah Polhemus that Dr Chalmers Cater will be sent 03/13/17 [facility] home visit notes.   GI issues- Continues to  report issues with abdominal symptoms (pain, diarrhea, nausea, heartburn). Hannah Mcquary states Dr Chalmers Cater stopped her Metformin 500 mg that she has been taking since 01/25/17 She had reported that she had stopped Metformin one week prior to the last 02/19/17 [facility] CM home visit related to GI side effects (nausea, diarrhea).  She had been encouraged by this [facility] CM on 02/28/17 to restart her Metformin until her provider advised otherwise.  Lower extremity swelling- Hannah Newville continues to have minimum swelling of her ankles but has not purchased compression stocking (with possible evaluation of correct DME by [employer]) to assist with swelling of lower extremities during long trips as discussed on 02/19/17 home visit.      Chronic Health Condition (DM, Hypertension, hyperlipidemia) Her DM, hypertension and hyperlipidemia are being managed with medications and diet.   HGA1C:  01/18/17 =  10.4 01/31/16 = 8.5 06/25/16 = 14  09/24/16 = 8.0  Fasting cbg today = 107 at 1030 am today with a 7 day average of 116 and a 30 day average of 166 Noted recent low cbg to 70 and high cbg in the 100s  BP       03/13/17 136/70             02/28/17 120/78              02/19/17 130/82              02/05/17 114/78   01/30/17 total cholesterol 181 HDL 49 LDL 109 triglycerides 113     Hannah Bardin had a scheduled sleep study on March 01, 2017 at 8 pm at [facility] in [place] which resulted in her starting a trial use of a CPAP.  She confirms she has the CPAP, prefers and is using it as ordered.  THN CM also provided and educated Hannah. Kreher on use of an incentive spirometer after noted congestion in left upper lobe with coughing at intervals during the past and present home visit.  Hannah [facility] voiced appreciation of DME.   She a CT of the abdomin and pelvic appointment on 03/06/17 at [facility].  The impression indicated -Bulky upper abdominal adenopathy that is new from 2016. Mediastinal and hilar adenopathy as seen on recent chest CT. Lymphoma remains the primary diagnostic concern. Sarcoidosis can also have this pattern. and Mild splenomegaly with infarct  [facility] CM answered questions and referred Mr Boom to Dr Luan Pulling for details of her CT. Hannah Brusseau was able to tell CM that Dr Luan Pulling had called her and wants her to complete a biopsy but had not given her a follow up pcp appointment.  THN CM discussed that pcp most likely would prefer to await all test results prior to making a pcp appointment.  THN CM offered to stop by pcp office to share her concerns and ask that she be called with an update.   Plan:  THN CM visited  pcp, Luan Pulling and shared Hannah Hulett's concerns about not knowing about her test results after leaving the home visit.  CM spoke with Deborha Payment (Dr Luan Pulling was examining a patient) who states that Dr Luan Pulling had called and spoken with Hannah Wahlberg and had ordered  for the 03/22/17 biopsy.  THN CM asked if Dr Luan Pulling could contact Hannah Conklin again to answer questions for her and possibly have her husband included in the conversation.  Hannah Armes will be updated  Barrier letters sent to Dr Chalmers Cater and Dr Luan Pulling  Hannah Lambson will be seen in two weeks for a follow home [facility] CM visit.    THN CM Care Plan Problem One     Most Recent Value  Care Plan Problem One  Knowledge Deficit related to self health management of Diabetes Mellitus, Type 2  Role Documenting the Problem One  Care Management [place] for Problem One  Active  THN Long Term Goal (31-90 days)  Over the next 60 days, patient will verbalize understanding of changes to diet, medication, and exercise regimen as recommended by her endocrinologist and CDE/RD  [facility] Long Term Goal Start Date  01/25/17  Interventions for Problem One Long Term Goal  Utilizing teachback method, discussed at length most recent changes to DIabetes treatment plan changes  THN CM Short Term Goal #1 (0-30 days)  Over the next 7 days,  patient will impletment medication dose change as prescribed and will notify provider with any side effects or cbg findings outside established parameters  [facility]N CM Short Term Goal #1 Start Date  01/25/17  [facility] CM Short Term Goal #1 Met Date  02/05/17  Interventions for Short Term Goal #1  medications reviewed w/ changes to insulin dose  THN CM Short Term Goal #2 (0-30 days)  Over the next 30 days patient will verbaliize understanding of possible weight reduction surgery as strategy for management of DM  THN CM Short Term Goal #2 Start Date  01/25/17  Interventions for Short Term Goal #2  discussed with patient by phone recommendations as made by PCP/PULM?ENDO,  scheudled visit with patient and plan for education re: weight loss surgery, EMMI article, wt loss surgery book for pt to review    [facility] CM Care Plan Problem Two     Most Recent Value  Care Plan Problem Two  acute health  condition-chest pain requiring ED visit  Role Documenting the Problem Two  Care Management Coordinator  Care Plan for Problem Two  Active  Interventions for Problem Two Long Term Goal   brief pt examination in home recommended and facilitated ED visit  THN Long Term Goal (31-90) days  over the next 45 days pt will verbalize understanding of plan of care for management of acute chest pain, leg swelling and GI symptoms  THN Long Term Goal Start Date  01/29/17  THN CM Short Term Goal #1 (0-30 days)  Over the next 7 days, patient will see pcp/pulmonary provider  as recommended  THN CM Short Term Goal #1 Start Date  01/30/17  [facility] CM Short Term Goal #1 Met Date   02/12/17  Interventions for Short Term Goal #2   assisted patient with pcp/pulmonary provider appointment scheduling,  update not to PCP    [facility] CM Care Plan Problem Three     Most Recent Value  Care Plan Problem Three  Acute Health Condition (peri-umbilical pain and early satiety)  Role Documenting the Problem Three  Care Management [place] for Problem Three  Active  THN Long Term Goal (31-90) days  Over the next 45 days, patient will verbalize understsanding of plan of care for acute health condition  THN Long Term Goal Start Date  02/19/17  Interventions for Problem Three Long Term Goal  Discussed new symptoms and forwarded assessment information to PCP     Joelene Millin [name], RN, BSN, [place] Care Management [phone]

## 2017-03-15 NOTE — Patient Outreach (Signed)
[facility]) Care Management  03/15/2017  CHASTELYN ATHENS 1961/01/15 [id number]  [facility] CM returned a call to Mrs Hungate after CM received a call but no message from her.  Pt confirmed with [facility] CM she had called but left no message and had wanted to inquire about [facility] CM visit response to Dr Luan Pulling' office after her home visit.   THN CM shared with her that CM spoke with Jacqlyn Larsen, office manager as Dr Luan Pulling was examining a patient.  THN CM discussed with Mrs Pietila that her concerns about knowing her test results was discussed and CM had asked that she be called by the pcp.  Mrs Paone confirmed she had not been called by pcp office.  THN CM again discussed with Mrs Stammen that pcp most likely is awaiting all test results including the upcoming biopsy details to schedule an appointment with her.  Mrs Totten voiced understanding.   Confirmed again with Mrs Tapp that her home visit information will be sent to Dr Chalmers Cater.  THN CM also discussed the use of [email or web address] access online (at home, on her smart phone or via computer at [employer]) to review her results of her previous sleep study and CT abdomen and pelvis.  Mrs Bossi voiced appreciation of the discussion of this resource.  Plans Mrs Mastrangelo will be seen for a home visit in the next 2 weeks  Lavayah Vita [name], RN, BSN, [place] Care Management [phone]

## 2017-03-18 ENCOUNTER — Encounter: Payer: Self-pay | Admitting: *Deleted

## 2017-03-21 ENCOUNTER — Other Ambulatory Visit: Payer: Self-pay | Admitting: Radiology

## 2017-03-22 ENCOUNTER — Ambulatory Visit (HOSPITAL_COMMUNITY)
Admission: RE | Admit: 2017-03-22 | Discharge: 2017-03-22 | Disposition: A | Discharge status: Home / Self Care | Payer: PPO | Source: Ambulatory Visit | Attending: Pulmonary Disease | Admitting: Pulmonary Disease

## 2017-03-22 ENCOUNTER — Encounter (HOSPITAL_COMMUNITY): Payer: Self-pay

## 2017-03-22 DIAGNOSIS — F4310 Post-traumatic stress disorder, unspecified: Secondary | ICD-10-CM | POA: Diagnosis not present

## 2017-03-22 DIAGNOSIS — K219 Gastro-esophageal reflux disease without esophagitis: Secondary | ICD-10-CM | POA: Diagnosis not present

## 2017-03-22 DIAGNOSIS — R599 Enlarged lymph nodes, unspecified: Secondary | ICD-10-CM | POA: Diagnosis not present

## 2017-03-22 DIAGNOSIS — I10 Essential (primary) hypertension: Secondary | ICD-10-CM | POA: Diagnosis not present

## 2017-03-22 DIAGNOSIS — Z794 Long term (current) use of insulin: Secondary | ICD-10-CM | POA: Diagnosis not present

## 2017-03-22 DIAGNOSIS — G4730 Sleep apnea, unspecified: Secondary | ICD-10-CM | POA: Insufficient documentation

## 2017-03-22 DIAGNOSIS — E1140 Type 2 diabetes mellitus with diabetic neuropathy, unspecified: Secondary | ICD-10-CM | POA: Insufficient documentation

## 2017-03-22 DIAGNOSIS — Z79899 Other long term (current) drug therapy: Secondary | ICD-10-CM | POA: Diagnosis not present

## 2017-03-22 DIAGNOSIS — E7800 Pure hypercholesterolemia, unspecified: Secondary | ICD-10-CM | POA: Insufficient documentation

## 2017-03-22 DIAGNOSIS — F329 Major depressive disorder, single episode, unspecified: Secondary | ICD-10-CM | POA: Diagnosis not present

## 2017-03-22 DIAGNOSIS — R911 Solitary pulmonary nodule: Secondary | ICD-10-CM | POA: Diagnosis not present

## 2017-03-22 LAB — PROTIME-INR
INR: 1.21
PROTHROMBIN TIME: 15.3 s — AB (ref 11.4–15.2)

## 2017-03-22 LAB — CBC
HEMATOCRIT: 36.5 % (ref 36.0–46.0)
Hemoglobin: 12 g/dL (ref 12.0–15.0)
MCH: 25.3 pg — ABNORMAL LOW (ref 26.0–34.0)
MCHC: 32.9 g/dL (ref 30.0–36.0)
MCV: 76.8 fL — ABNORMAL LOW (ref 78.0–100.0)
Platelets: 177 10*3/uL (ref 150–400)
RBC: 4.75 MIL/uL (ref 3.87–5.11)
RDW: 14.4 % (ref 11.5–15.5)
WBC: 2.6 10*3/uL — ABNORMAL LOW (ref 4.0–10.5)

## 2017-03-22 LAB — GLUCOSE, CAPILLARY: GLUCOSE-CAPILLARY: 175 mg/dL — AB (ref 65–99)

## 2017-03-22 LAB — APTT: APTT: 32 s (ref 24–36)

## 2017-03-22 MED ORDER — LIDOCAINE HCL 1 % IJ SOLN
INTRAMUSCULAR | Status: AC
  Filled 2017-03-22: qty 20

## 2017-03-22 MED ORDER — SODIUM CHLORIDE 0.9 % IV SOLN: INTRAVENOUS | Status: DC

## 2017-03-22 NOTE — H&P (Signed)
Chief Complaint: Patient was seen in consultation today for abdominal lymph node biopsy at the request of Hawkins,Edward  Referring Physician(s): Hawkins,Edward  Supervising Physician: Sandi Mariscal  Patient Status: [facility] - Out-pt  History of Present Illness: Hannah Schultz is a 56 y.o. female   Pt states she has had a cough since Dec 2017 PMD performed evaluation with CXR and CT scan 01/29/17 Revealed mediastinal and hilar LAN;  Upper abdominal LAN Repeat scan 3/29: IMPRESSION: 1. Bulky upper abdominal adenopathy that is new from 2016. Mediastinal and hilar adenopathy as seen on recent chest CT. Lymphoma remains the primary diagnostic concern. Sarcoidosis can also have this pattern. 2. Mild splenomegaly with infarct.  Now scheduled for biopsy of abdominal LAN biopsy   Past Medical History:  Diagnosis Date  . Acid reflux   . Arthritis   . Bulging lumbar disc   . Depression   . Diabetes mellitus   . Diabetic neuropathy ([place])   . High cholesterol   . Hypertension   . Post traumatic stress disorder (PTSD)   . Sleep apnea     Past Surgical History:  Procedure Laterality Date  . ABDOMINAL HYSTERECTOMY    . CHOLECYSTECTOMY    . TONSILLECTOMY  04/02/2012   Procedure: TONSILLECTOMY;  Surgeon: Ascencion Dike, MD;  Location: [facility] OR;  Service: ENT;  Laterality: Bilateral;    Allergies: Adhesive [tape]  Medications: Prior to Admission medications   Medication Sig Start Date End Date Taking? Authorizing Provider  Cholecalciferol (VITAMIN D3) 5000 UNITS TABS Take 1 tablet by mouth daily.   Yes Historical Provider, MD  FLUoxetine (PROZAC) 20 MG tablet Take 20 mg by mouth daily.   Yes Historical Provider, MD  gabapentin (NEURONTIN) 300 MG capsule Take 600 mg by mouth 3 (three) times daily.    Yes Historical Provider, MD  glucose blood (ONE TOUCH ULTRA TEST) test strip 3 times per day 12/29/13  Yes Elayne Snare, MD  Insulin Degludec (TRESIBA FLEXTOUCH) 200 UNIT/ML SOPN Inject 50  Units into the skin every morning.    Yes Bindubal Balan, MD  Insulin NPH Human, Isophane, (NOVOLIN N [place]) Inject 0-15 Units into the skin 3 (three) times daily. Sliding scale   Yes Historical Provider, MD  Insulin Pen Needle (BD PEN NEEDLE NANO U/F) 32G X 4 MM MISC 1 each by Does not apply route 4 (four) times daily. 08/12/13  Yes Elayne Snare, MD  nebivolol (BYSTOLIC) 5 MG tablet Take 5 mg by mouth daily.     Yes Historical Provider, MD  ranitidine (ZANTAC) 75 MG tablet Take 75 mg by mouth at bedtime.   Yes Historical Provider, MD  simvastatin (ZOCOR) 40 MG tablet Take 1 tablet (40 mg total) by mouth every evening. 09/22/13  Yes Elayne Snare, MD  sitaGLIPtin (JANUVIA) 100 MG tablet Take 100 mg by mouth daily.   Yes Historical Provider, MD  trazodone (DESYREL) 300 MG tablet Take 300 mg by mouth daily as needed for sleep.    Yes Historical Provider, MD  doxycycline (VIBRAMYCIN) 100 MG capsule Take 100 mg by mouth 2 (two) times daily. 10 day course starting on 01/23/2017 01/23/17   Historical Provider, MD  Insulin Aspart (NOVOLOG FLEXPEN [place]) Inject 20 Units into the skin 3 (three) times daily.     Jacelyn Pi, MD  metFORMIN (GLUCOPHAGE-XR) 500 MG 24 hr tablet Take 500 mg by mouth at bedtime.  01/25/17   Historical Provider, MD  methylPREDNISolone (MEDROL DOSEPAK) 4 MG TBPK tablet Take 4-32  mg by mouth See admin instructions. Take as directed per package instructions [id number]) 01/23/17   Historical Provider, MD  NOVOLIN N RELION 100 UNIT/ML injection Inject 20 Units into the skin 3 (three) times daily before meals.  12/13/16   Historical Provider, MD     Family History  Problem Relation Age of Onset  . Heart disease    . Cancer    . Diabetes      Social History   Social History  . Marital status: Married    Spouse name: N/A  . Number of children: N/A  . Years of education: N/A   Social History Main Topics  . Smoking status: Never Smoker  . Smokeless tobacco: Never Used  . Alcohol use No  .  Drug use: No  . Sexual activity: Yes    Birth control/ protection: Surgical   Other Topics Concern  . None   Social History Narrative  . None    Review of Systems: A 12 point ROS discussed and pertinent positives are indicated in the HPI above.  All other systems are negative.  Review of Systems  Constitutional: Positive for fatigue. Negative for activity change, appetite change and fever.  Respiratory: Negative for cough and shortness of breath.   Gastrointestinal: Positive for abdominal pain.  Musculoskeletal: Positive for gait problem.       Uses cane  Psychiatric/Behavioral: Negative for behavioral problems and confusion.    Vital Signs: BP 129/84 (BP Location: Right Arm)   Pulse 64   Temp 98.1 F (36.7 C) (Oral)   Ht 5\' 5"  (1.651 m)   Wt 260 lb (117.9 kg)   SpO2 96%   BMI 43.27 kg/m   Physical Exam  Constitutional: She is oriented to person, place, and time.  Cardiovascular: Normal rate, regular rhythm and normal heart sounds.   Pulmonary/Chest: Effort normal and breath sounds normal.  Abdominal: Soft. Bowel sounds are normal. There is no tenderness.  Musculoskeletal: Normal range of motion.  Neurological: She is alert and oriented to person, place, and time.  Skin: Skin is warm and dry.  Psychiatric: She has a normal mood and affect. Her behavior is normal. Judgment and thought content normal.  Nursing note and vitals reviewed.   Mallampati Score:  MD Evaluation Airway: WNL Heart: WNL Abdomen: WNL ASA  Classification: 2 Mallampati/Airway Score: Two  Imaging: Ct Abdomen Pelvis W Contrast  Result Date: 03/07/2017 CLINICAL DATA:  Abdominal pain EXAM: CT ABDOMEN AND PELVIS WITH CONTRAST TECHNIQUE: Multidetector CT imaging of the abdomen and pelvis was performed using the standard protocol following bolus administration of intravenous contrast. CONTRAST:  100 cc Isovue-300 intravenous COMPARISON:  03/21/2015 FINDINGS: Lower chest: Mediastinal and hilar  adenopathy as seen on recent chest CT.Anterior diaphragmatic adenopathy. Hepatobiliary: No focal liver abnormality.Cholecystectomy Pancreas: Unremarkable. Spleen: Generous size (14 cm in length, with band of nonenhancement consistent with infarct. Adrenals/Urinary Tract: Negative adrenals. No hydronephrosis or stone. Unremarkable bladder. Stomach/Bowel:  No obstruction. No appendicitis. Vascular/Lymphatic: Bulky adenopathy in the hepatic hilum and around the celiac/left gastric with mild mass effect on the main portal vein. Nodes measure up to 3 cm in short axis. The nodes are relatively homogeneous and hypoenhancing, matching the intrathoracic nodes. There has been no demonstrable progression since prior. Negative vasculature. Reproductive:Hysterectomy with negative ovaries. Other: No ascites or pneumoperitoneum.  Fatty umbilical hernia. Musculoskeletal: No acute abnormalities. These results will be called to the ordering clinician or representative by the Radiologist Assistant, and communication documented in the PACS or zVision Dashboard.  IMPRESSION: 1. Bulky upper abdominal adenopathy that is new from 2016. Mediastinal and hilar adenopathy as seen on recent chest CT. Lymphoma remains the primary diagnostic concern. Sarcoidosis can also have this pattern. 2. Mild splenomegaly with infarct. Electronically Signed   By: Monte Fantasia M.D.   On: 03/07/2017 07:25    Labs:  CBC:  Recent Labs  01/29/17 1601 03/22/17 0952  WBC 3.5* 2.6*  HGB 13.2 12.0  HCT 40.0 36.5  PLT 250 177    COAGS:  Recent Labs  03/22/17 0952  INR 1.21  APTT 32    BMP:  Recent Labs  01/29/17 1601  NA 137  K 3.6  CL 100*  CO2 29  GLUCOSE 112*  BUN 16  CALCIUM 8.9  CREATININE 0.86  GFRNONAA >60  GFRAA >60    LIVER FUNCTION TESTS: No results for input(s): BILITOT, AST, ALT, ALKPHOS, PROT, ALBUMIN in the last 8760 hours.  TUMOR MARKERS: No results for input(s): AFPTM, CEA, CA199, CHROMGRNA in the last  8760 hours.  Assessment and Plan:  Abdominal lymphadenopathy- enlarging Worrisome for sarcoidosis or lymphoma Scheduled for biopsy today Risks and Benefits discussed with the patient including, but not limited to bleeding, infection, damage to adjacent structures or low yield requiring additional tests. All of the patient's questions were answered, patient is agreeable to proceed. Consent signed and in chart.   Thank you for this interesting consult.  I greatly enjoyed meeting Hannah Schultz and look forward to participating in their care.  A copy of this report was sent to the requesting provider on this date.  Electronically Signed: Dominick Morella A 03/22/2017, 11:32 AM   I spent a total of  30 Minutes   in face to face in clinical consultation, greater than 50% of which was counseling/coordinating care for abdominal LAN bx

## 2017-03-22 NOTE — Sedation Documentation (Signed)
Doctor at bedside and spoke with patient and husband. Will not perform procedure today.

## 2017-03-22 NOTE — Progress Notes (Signed)
Interventional Radiology Progress Note  56 year old female presents today for possible CT guided biopsy of upper abdominal lymph nodes.   Discussed the case with referring physician Dr. Luan Pulling at time of the approval for biopsy.  Discussed imaging findings suggesting sarcoid, and that a CT guided biopsy of hilar nodes in the liver would require a trans-capsular approach on both the superficial and deep liver, as well as near hilar vasculature.    Alternative biopsy targets to consider would be mediastinal nodes or endoscopic approach to the abd nodes.   Discussed the imaging findings and impression with the patient and her husband, including the risk and possible sequela of hemorrhage.  Discussed possible alternative lab/imaging diagnostics which may include labs and/or PET CT.     Given the risks discussed, she elects to defer biopsy for now, but understands that biopsy may eventually be needed.   Signed,  Dulcy Fanny. Earleen Newport, DO

## 2017-03-22 NOTE — Sedation Documentation (Signed)
Husband at bedside.  

## 2017-03-27 ENCOUNTER — Other Ambulatory Visit (HOSPITAL_COMMUNITY): Payer: Self-pay | Admitting: Pulmonary Disease

## 2017-03-27 ENCOUNTER — Other Ambulatory Visit: Payer: Self-pay | Admitting: *Deleted

## 2017-03-27 DIAGNOSIS — R1909 Other intra-abdominal and pelvic swelling, mass and lump: Secondary | ICD-10-CM

## 2017-03-27 NOTE — Patient Outreach (Signed)
[facility]) Care Management  03/27/2017  Hannah Schultz [birth year] [id number]    Care coordination THN CM received a page from Hannah Schultz on [facility] CM mobile number   [facility] returned a call to her Hannah Schultz who inquired if [facility] CM was schedule dot visit her on today because she had THN CM on her calendar.  THN CM confirmed that CM visit is scheduled for 03/28/17 at 1pm.   Hannah Schultz updated [facility] CM that she elected not to have her biopsy done on 03/22/17 as ordered.  States she and Mr Kil arrived and Dr Loreta Ave discussed that he had spoken to Dr Juanetta Gosling about her diagnosis and the risks for doing the biopsy, ""that there was a fifty,fifty chance of me bleeding out" " He told us I have an common african american diagnosis but I can not recall the name. To tel you the truth Hannah Schultz I was hunger and tired but Roland does not remember either"  THN CM reviewed Dr Kenna Gilbert notes in [facility] as Hannah Schultz was sharing what had been shared with her. Al the information she shared was in Dr Kenna Gilbert noted except she did not discuss lymphoma or sarcoidosis.  She discussed Dr Loreta Ave took labs and that he mentioned a PET scan but it has not been scheduled because she called Loreta Ave office to check and they informed her she needs "a referral"  Hannah Schultz was allowed to ventilate her feelings.THN CM strongly encouraged that she and Dr Juanetta Gosling meet face to face and discussed her concerns. THN CM discussed how she could call the office to make an appointment or to request to be put on a waiting or cancellation appointment list so they can call her to get her in the office at an early date if someone no showed or cancelled an appointment  [facility] CM again reviewed with Hannah Schultz interaction at Dr Juanetta Gosling office on last week and the same information CM shared with her on Friday 03/22/17. When [facility] CM inquired what she needed to proceed further Hannah Schultz stated "I need to know what is the next?"    [facility] questioned Hannah Schultz  about her disinterest in activity and depression. She would not initially discuss it and did not admit to depression.   THN CM offered to call [place] office to see if a referral for PET completed THN called the RN line, Called for [facility] and then called to attempt to reach the scheduler. No answer x 3 THN CM left a message for Becky at [phone] with Hannah Schultz concerns, inquired about PET scan an requested a return call.   THN CM returned a call to Hannah Schultz to update her on calls and message to Dr Juanetta Gosling office.  Offer to call her is CM received a return call from Dr Juanetta Gosling office today or to call the office again tomorrow during the home visit.  Hannah Schultz at this time states that she is getting " a little depressed" and just want to know what is wrong with her and what is next.  She declined offer for [facility] referral to assist with coping and depression resources at this time at this time She stated she had some resources but would ask CM for offered services if she needed them later.  THN CM again inquired if Hannah Schultz could recall the diagnosis Dr Loreta Ave shared with her without success.   Again Hannah Schultz informed THN CM most of the content of  Dr Pasty Arch note but did not mentioned the documented lymphoma or sarcoidosis and did not stated Dr Luan Pulling had not mentioned that there was a RN in the room when Dr Earleen Newport reviewed the biopsy and recommendations for labs and PET scan.    Plans: [facility] CM will return a call to Hannah Schultz if she receives a return call from Dr Luan Pulling office.  Hannah Hannah Schultz will be seen for a home visit on this week and questions answered to best of CM knowledge.  [name] [name], RN, BSN, [place] Care Management [phone]

## 2017-03-28 ENCOUNTER — Other Ambulatory Visit: Payer: Self-pay | Admitting: *Deleted

## 2017-03-30 NOTE — Patient Outreach (Signed)
[facility]) Care Management   03/30/2017  Hannah Schultz 07/16/1961 [id number]  Hannah Schultz is an 56 y.o. female being followed by [facility] CM for care coordination and education related DM management and has been seen previously by [facility] for medication assistance.  Hannah Schultz has a PMH of DM Type II, hypertension, hyperlipidemia, carpal tunnel syndrome, and trigger thumb of right hand.    She has not had an admissions but 1 ED visit in the last 6 months.  Her ED visit was on 01/29/17 for nonspecific chest pain and mediastinal adenopathy with recommendation for follow up with pcp for the evaluation of the 01/29/17 abnormal Chest CT imaging that showed "Enlarged mediastinal and bilateral hilar nodes, measuring up to 2.2 cm. Enlarged nodes at the upper abdomen, about the celiac trunk. This could reflect lymphoma, sarcoidosis or a variety of inflammatory conditions. The appearance is less typical for metastatic disease. A number of the mediastinal nodes would be amenable to biopsy, as deemed clinically appropriate."  Plus "minimal bibasilar atelectasis noted. Lungs otherwise clear."   Subjective:  "My cousin Hannah Schultz was today and he died of cancer but after I got dressed I did not feel like I could go.  I could not deal with that today."  "His cancer came back at stage four.  He went into the hospital and came home on day one with hospice and died."  " I am depressed but got resources to deal with it"   Objective:   BP 132/74 (BP Location: Left Arm, Patient Position: Sitting, Cuff Size: Normal)   Pulse 69   Resp 20   SpO2 97%   Review of Systems  Constitutional: Positive for malaise/fatigue and weight loss. Negative for chills, diaphoresis and fever.  HENT: Negative for congestion, ear discharge, ear pain, hearing loss, nosebleeds, sinus pain, sore throat and tinnitus.   Eyes: Negative.  Negative for blurred vision, double vision, photophobia, pain, discharge and  redness.  Respiratory: Negative for cough, hemoptysis, sputum production, shortness of breath, wheezing and stridor.   Cardiovascular: Negative.  Negative for chest pain, palpitations, orthopnea, claudication, leg swelling and PND.  Gastrointestinal: Positive for abdominal pain and nausea. Negative for blood in stool, constipation, diarrhea, heartburn, melena and vomiting.  Genitourinary: Negative.  Negative for dysuria, flank pain, frequency, hematuria and urgency.  Musculoskeletal: Positive for myalgias. Negative for back pain, falls, joint pain and neck pain.  Skin: Negative for itching and rash.  Neurological: Positive for weakness. Negative for dizziness, tingling, tremors, sensory change, speech change, focal weakness, seizures, loss of consciousness and headaches.  Endo/Heme/Allergies: Negative.  Negative for environmental allergies and polydipsia. Does not bruise/bleed easily.  Psychiatric/Behavioral: Positive for depression. Negative for hallucinations, memory loss, substance abuse and suicidal ideas. The patient is not nervous/anxious and does not have insomnia.     Physical Exam  Constitutional: She is oriented to person, place, and time. She appears well-developed and well-nourished.  HENT:  Head: Normocephalic and atraumatic.  Eyes: Conjunctivae are normal. Pupils are equal, round, and reactive to light.  Neck: Normal range of motion. Neck supple.  Cardiovascular: Normal rate, regular rhythm and normal heart sounds.   Respiratory: Effort normal and breath sounds normal.  GI: Soft. Bowel sounds are normal.  Musculoskeletal: Normal range of motion.  Neurological: She is alert and oriented to person, place, and time.  Skin: Skin is warm and dry.  Psychiatric: Her speech is normal and behavior is normal. Judgment and thought content normal. Cognition and  memory are impaired. She exhibits a depressed mood.    Encounter Medications:   Outpatient Encounter Prescriptions as of  03/28/2017  Medication Sig Note  . Cholecalciferol (VITAMIN D3) 5000 UNITS TABS Take 1 tablet by mouth daily.   Marland Kitchen doxycycline (VIBRAMYCIN) 100 MG capsule Take 100 mg by mouth 2 (two) times daily. 10 day course starting on 01/23/2017   . FLUoxetine (PROZAC) 20 MG tablet Take 20 mg by mouth daily.   Marland Kitchen gabapentin (NEURONTIN) 300 MG capsule Take 600 mg by mouth 3 (three) times daily.    Marland Kitchen glucose blood (ONE TOUCH ULTRA TEST) test strip 3 times per day   . Insulin Aspart (NOVOLOG FLEXPEN [place]) Inject 20 Units into the skin 3 (three) times daily.  12/20/2016: Now taking 30 units TID as per MD  . Insulin Degludec (TRESIBA FLEXTOUCH) 200 UNIT/ML SOPN Inject 50 Units into the skin every morning.    . Insulin NPH Human, Isophane, (NOVOLIN N [place]) Inject 0-15 Units into the skin 3 (three) times daily. Sliding scale   . Insulin Pen Needle (BD PEN NEEDLE NANO U/F) 32G X 4 MM MISC 1 each by Does not apply route 4 (four) times daily.   . metFORMIN (GLUCOPHAGE-XR) 500 MG 24 hr tablet Take 500 mg by mouth at bedtime.  02/28/2017: Clarification not taking as ordered last about a week ago  . methylPREDNISolone (MEDROL DOSEPAK) 4 MG TBPK tablet Take 4-32 mg by mouth See admin instructions. Take as directed per package instructions (6,5,4,3,2,1)   . nebivolol (BYSTOLIC) 5 MG tablet Take 5 mg by mouth daily.     Marland Kitchen NOVOLIN N RELION 100 UNIT/ML injection Inject 20 Units into the skin 3 (three) times daily before meals.    . ranitidine (ZANTAC) 75 MG tablet Take 75 mg by mouth at bedtime.   . simvastatin (ZOCOR) 40 MG tablet Take 1 tablet (40 mg total) by mouth every evening.   . sitaGLIPtin (JANUVIA) 100 MG tablet Take 100 mg by mouth daily.   . trazodone (DESYREL) 300 MG tablet Take 300 mg by mouth daily as needed for sleep.     No facility-administered encounter medications on file as of 03/28/2017.     Assessment:    Acute Health Condition Hannah Schultz was noted to be sitting at her living room during this [facility] CM home  visit. She came to sit with [facility] CM at her dining room table. Today she was dressed with her hair styled and with lipstick on. GI issues- Reports issues with abdominal symptoms (nausea,loss of appetite).   THN CM discussed previously scheduled CT of abdomen that she elected to not have completed related to "fifty percent risk of bleeding out" and her upcoming PET scan assist with finding an accessible lymph node to obtain a biopsy that will assist her providers in getting a definitive diagnosis.    Hannah Schultz continues to reports loss of weight but unable to provide a weight value again.  She reports noting changes in the fit of her clothes. A documented weight of 260 lbs for 03/22/17 is listed in [place].    Chronic Health Condition (DM, Hypertension, hyperlipidemia) THN CM with Hannah Schultz placed a telephone call to Dr Luan Pulling office to allow Hannah & Mr Droege to speak with Dr Luan Pulling about "what is my next step" and "what is my diagnosis?"  Dr Luan Pulling informed Hannah Schultz that Dr Earleen Newport spoke with him and felt the invasive biopsy on 03/22/17 was a risk and recommended the upcoming  PET scan to assist with locating an accessible lymph node to access to get the needed biopsy or she would have to have a surgical biopsy performed to determine if he cause of her area on her lung lining and GI symptoms are related to lymphoma or sarcoidosis.  Hannah Schultz thanked Dr Luan Pulling for clarity and answering her questions.  After speaking with Dr Luan Pulling Hannah Schultz and [facility] CM reviewed in detail the PET scan, Sarcoidosis and Lymphoma.  THN CM provided written handouts for each diagnosis's cause, diagnostic procedures, prognoses, risks, treatment and food recommended to avoid and appropriate to eat for sarcoidosis.  Mr Buster questions were answered and she took notes. THN CM discussed Depression, causes and treatment with community resources.  Hannah Schultz shared various causes of her change in mood/depressed mood related to her process  of getting the answer to her medical symptoms, dealing with her family member's death, her father's death from cancer and emotions related to unsupportive social acquaintances. THN CM provided emotional support. Hannah Schultz was smiling and joking with [facility] CM and her husband prior to CM departure.  Mr Klett is supportive of Hannah Hogan and voiced his appreciation for [facility] CM home visit.   Her DM, hypertension and hyperlipidemia are being managed with medications and decrease in appetite.    HGA1C:  01/18/17 = 10.4 01/31/16 = 8.5 06/25/16 = 14  09/24/16 = 8.0 BMI 43.40, wt 260 lbs on 03/22/17  Fasting cbg today = 137 at 1214 pm Noted recent low cbg to 70 and high cbg in the 130s  BP      03/28/17 132/74  02/28/17 120/78              02/19/17 130/82              02/05/17 114/78   01/30/17 total cholesterol 181 HDL 49 LDL 109 triglycerides 113   Hannah Schultz recently started taking vitamin D 500 mg ordered by her endocrinologist, Bindubal Balan. She reports that she is still taking there vitamin D 400 mg and waiting for [employer] to get the correct dose.  Plan:   Continue to follow Hannah Schultz for community resource needs, provide support as needed through diagnostic procedures and monitor DM conditions.  THN CM will follow up with Hannah Schultz in 1-2 weeks and she was encouraged to contact [facility] CM as needed.  THN CM Care Plan Problem One     Most Recent Value  Care Plan Problem One  Knowledge Deficit related to self health management of Diabetes Mellitus, Type 2  Role Documenting the Problem One  Care Management [place] for Problem One  Active  THN Long Term Goal (31-90 days)  Over the next 60 days, patient will verbalize understanding of changes to diet, medication, and exercise regimen as recommended by her endocrinologist and CDE/RD  [facility] Long Term Goal Start Date  03/28/17  Interventions for Problem One Long Term Goal  Utilizing teachback method, discussed at length most recent  changes to DIabetes treatment plan changes  THN CM Short Term Goal #1 (0-30 days)  Over the next 7 days,  patient will impletment medication dose change as prescribed and will notify provider with any side effects or cbg findings outside established parameters  THN CM Short Term Goal #1 Start Date  01/25/17  [facility] CM Short Term Goal #1 Met Date  02/05/17  Interventions for Short Term Goal #1  medications reviewed w/ changes to insulin dose  THN CM  Short Term Goal #2 (0-30 days)  Over the next 30 days patient will verbaliize understanding of possible weight reduction surgery as strategy for management of DM  THN CM Short Term Goal #2 Start Date  01/25/17  [facility] CM Short Term Goal #2 Met Date  02/19/17  Interventions for Short Term Goal #2  discussed with patient by phone recommendations as made by PCP/PULM?ENDO,  scheudled visit with patient and plan for education re: weight loss surgery, EMMI article, wt loss surgery book for pt to review    [facility] CM Care Plan Problem Two     Most Recent Value  Care Plan Problem Two  acute health condition-chest pain requiring ED visit  Role Documenting the Problem Two  Care Management Coordinator  Care Plan for Problem Two  Active  Interventions for Problem Two Long Term Goal   brief pt examination in home recommended and facilitated ED visit  THN Long Term Goal (31-90) days  over the next 45 days pt will verbalize understanding of plan of care for management of acute chest pain, leg swelling and GI symptoms  THN Long Term Goal Start Date  01/29/17  THN Long Term Goal Met Date  03/28/17  THN CM Short Term Goal #1 (0-30 days)  Over the next 7 days, patient will see pcp/pulmonary provider  as recommended  THN CM Short Term Goal #1 Start Date  01/30/17  [facility] CM Short Term Goal #1 Met Date   02/12/17  Interventions for Short Term Goal #2   assisted patient with pcp/pulmonary provider appointment scheduling,  update not to PCP    [facility] CM Care Plan Problem Three     Most  Recent Value  Care Plan Problem Three  Acute Health Condition (peri-umbilical pain and early satiety)  Role Documenting the Problem Three  Care Management Coordinator  Care Plan for Problem Three  Active  THN Long Term Goal (31-90) days  Over the next 45 days, patient will verbalize understsanding of plan of care for acute health condition  THN Long Term Goal Start Date  02/19/17  [facility] Long Term Goal Met Date  03/28/17  Interventions for Problem Three Long Term Goal  Discussed new symptoms and forwarded assessment information to PCP  [facility] CM Short Term Goal #1 (0-30 days)  over the next 30 days the patine will have a PET sccan and be scheduled for a biopsy to assist with definitive medical diagnosis  THN CM Short Term Goal #1 Start Date  03/28/17  Interventions for Short Term Goal #1  reveiw PET scan, sarcoidosis, lymphoma in detaiil, answer questoin ans re educate as needed        [employer]. Lavina Hamman, RN, BSN, [place] Care Management [phone]

## 2017-04-04 ENCOUNTER — Ambulatory Visit (HOSPITAL_COMMUNITY)
Admission: RE | Admit: 2017-04-04 | Discharge: 2017-04-04 | Disposition: A | Discharge status: Home / Self Care | Payer: PPO | Source: Ambulatory Visit | Attending: Pulmonary Disease | Admitting: Pulmonary Disease

## 2017-04-04 DIAGNOSIS — R591 Generalized enlarged lymph nodes: Secondary | ICD-10-CM | POA: Diagnosis not present

## 2017-04-04 DIAGNOSIS — R1909 Other intra-abdominal and pelvic swelling, mass and lump: Secondary | ICD-10-CM | POA: Diagnosis not present

## 2017-04-04 DIAGNOSIS — R918 Other nonspecific abnormal finding of lung field: Secondary | ICD-10-CM | POA: Insufficient documentation

## 2017-04-04 DIAGNOSIS — R599 Enlarged lymph nodes, unspecified: Secondary | ICD-10-CM | POA: Diagnosis not present

## 2017-04-04 LAB — GLUCOSE, CAPILLARY: Glucose-Capillary: 156 mg/dL — ABNORMAL HIGH (ref 65–99)

## 2017-04-04 MED ORDER — FLUDEOXYGLUCOSE F - 18 (FDG) INJECTION
12.6000 | Freq: Once | INTRAVENOUS | Status: AC | PRN
  Administered 2017-04-04: 12.6 via INTRAVENOUS

## 2017-04-08 ENCOUNTER — Other Ambulatory Visit: Payer: Self-pay | Admitting: *Deleted

## 2017-04-08 NOTE — Patient Outreach (Signed)
[facility]) Care Management  04/08/2017  JAMISON YUHASZ 19-Dec-1960 [id number]  [facility] CM spoke with Mrs Charlson after noting a call from her left on [facility] CM mobile number on Saturday April 06 2017 at 2044.  THN CM reviewed EPIC prior to the call to find pt not seen in ED this weekend.  Mrs Doleman informed THN CM she had nausea and emesis "all weekend" and states she is still lying down.  Reports her husband and son have taken good care of her.  Her son has encouraged her to stay hydrated and offered Gatorade.   She reports calling Dr Miguel Rota office "this morning" and he returned her call and she shared her weekend illness symptoms with Dr Luan Pulling.  She also states Dr Luan Pulling shared with her the results of her recent PET scan.  She states she was informed "More spots were found in my abdomen and neck" and "They will not be able to do a regular biopsy.  I would have to have surgery to get the biopsy"  She states Dr Luan Pulling will be contacting "the hospital to setup the surgery"  [facility] CM praised Mrs Quant for contacting Dr Luan Pulling to discuss her imaging test results.  Mrs Michelin also reminded she has availability via Pharmacist, [email or web address] to review her test results.   Mrs Hashimi asked questions about her PET results and [facility] CM answered her questions.  Mrs Isaza voiced understanding.  THN CM referred her back to Dr Luan Pulling also for further questions.  THN Cm discussed the next step would be to talk with Dr Luan Pulling staff about the details of where and when the surgical procedure will take place.  Mrs Buffalo agreed to do so.  THN CM unable to see an already scheduled appointment in [facility] for an upcoming procedure for her and she was informed.   THN CM reminded Mrs Hillegass of the availability to access the 24 hour nurse line also especially during the weekends.  She informed THN CM she preferred to speak with [facility] CMs.  [facility] CM reminded her that [facility] CM services are available on weekdays.     Plan:  [facility]  CM will see Mrs Crilly for a [facility] CM home visit (follow- up) this week  Joelene Millin [name], RN, BSN, [place] Care Management [phone]

## 2017-04-11 ENCOUNTER — Encounter (HOSPITAL_COMMUNITY): Payer: Self-pay | Admitting: Emergency Medicine

## 2017-04-11 ENCOUNTER — Emergency Department (HOSPITAL_COMMUNITY): Payer: PPO

## 2017-04-11 ENCOUNTER — Other Ambulatory Visit: Payer: Self-pay | Admitting: *Deleted

## 2017-04-11 ENCOUNTER — Emergency Department (HOSPITAL_COMMUNITY)
Admission: EM | Admit: 2017-04-11 | Discharge: 2017-04-11 | Disposition: A | Discharge status: Home / Self Care | Payer: PPO | Attending: Emergency Medicine | Admitting: Emergency Medicine

## 2017-04-11 DIAGNOSIS — E119 Type 2 diabetes mellitus without complications: Secondary | ICD-10-CM | POA: Diagnosis not present

## 2017-04-11 DIAGNOSIS — R300 Dysuria: Secondary | ICD-10-CM | POA: Diagnosis not present

## 2017-04-11 DIAGNOSIS — R350 Frequency of micturition: Secondary | ICD-10-CM | POA: Diagnosis not present

## 2017-04-11 DIAGNOSIS — R1111 Vomiting without nausea: Secondary | ICD-10-CM | POA: Diagnosis not present

## 2017-04-11 DIAGNOSIS — Z794 Long term (current) use of insulin: Secondary | ICD-10-CM | POA: Insufficient documentation

## 2017-04-11 DIAGNOSIS — I10 Essential (primary) hypertension: Secondary | ICD-10-CM | POA: Insufficient documentation

## 2017-04-11 DIAGNOSIS — R112 Nausea with vomiting, unspecified: Secondary | ICD-10-CM | POA: Diagnosis not present

## 2017-04-11 DIAGNOSIS — R1030 Lower abdominal pain, unspecified: Secondary | ICD-10-CM

## 2017-04-11 DIAGNOSIS — Z79899 Other long term (current) drug therapy: Secondary | ICD-10-CM | POA: Diagnosis not present

## 2017-04-11 DIAGNOSIS — R1031 Right lower quadrant pain: Secondary | ICD-10-CM | POA: Diagnosis not present

## 2017-04-11 LAB — CBC
HCT: 40 % (ref 36.0–46.0)
Hemoglobin: 13.3 g/dL (ref 12.0–15.0)
MCH: 25.5 pg — ABNORMAL LOW (ref 26.0–34.0)
MCHC: 33.3 g/dL (ref 30.0–36.0)
MCV: 76.6 fL — AB (ref 78.0–100.0)
PLATELETS: 181 10*3/uL (ref 150–400)
RBC: 5.22 MIL/uL — ABNORMAL HIGH (ref 3.87–5.11)
RDW: 14.9 % (ref 11.5–15.5)
WBC: 2.9 10*3/uL — AB (ref 4.0–10.5)

## 2017-04-11 LAB — URINALYSIS, ROUTINE W REFLEX MICROSCOPIC
Bilirubin Urine: NEGATIVE
Glucose, UA: NEGATIVE mg/dL
Ketones, ur: NEGATIVE mg/dL
NITRITE: NEGATIVE
PH: 5 (ref 5.0–8.0)
Protein, ur: 100 mg/dL — AB
SPECIFIC GRAVITY, URINE: 1.012 (ref 1.005–1.030)

## 2017-04-11 LAB — COMPREHENSIVE METABOLIC PANEL
ALBUMIN: 3.9 g/dL (ref 3.5–5.0)
ALT: 31 U/L (ref 14–54)
AST: 45 U/L — AB (ref 15–41)
Alkaline Phosphatase: 66 U/L (ref 38–126)
Anion gap: 11 (ref 5–15)
BILIRUBIN TOTAL: 1.5 mg/dL — AB (ref 0.3–1.2)
BUN: 16 mg/dL (ref 6–20)
CO2: 25 mmol/L (ref 22–32)
CREATININE: 1.93 mg/dL — AB (ref 0.44–1.00)
Calcium: 11 mg/dL — ABNORMAL HIGH (ref 8.9–10.3)
Chloride: 99 mmol/L — ABNORMAL LOW (ref 101–111)
GFR calc Af Amer: 33 mL/min — ABNORMAL LOW (ref >60)
GFR, EST NON AFRICAN AMERICAN: 29 mL/min — AB (ref >60)
GLUCOSE: 179 mg/dL — AB (ref 65–99)
Potassium: 5.7 mmol/L — ABNORMAL HIGH (ref 3.5–5.1)
Sodium: 135 mmol/L (ref 135–145)
TOTAL PROTEIN: 8.1 g/dL (ref 6.5–8.1)

## 2017-04-11 LAB — POTASSIUM: POTASSIUM: 4.2 mmol/L (ref 3.5–5.1)

## 2017-04-11 LAB — LIPASE, BLOOD: LIPASE: 35 U/L (ref 11–51)

## 2017-04-11 MED ORDER — CEPHALEXIN 500 MG PO CAPS: 500.0000 mg | ORAL_CAPSULE | Freq: Four times a day (QID) | ORAL | 0 refills | Status: DC

## 2017-04-11 MED ORDER — SODIUM CHLORIDE 0.9 % IV BOLUS (SEPSIS)
1000.0000 mL | Freq: Once | INTRAVENOUS | Status: AC
  Administered 2017-04-11: 1000 mL via INTRAVENOUS

## 2017-04-11 MED ORDER — ONDANSETRON HCL 4 MG/2ML IJ SOLN
4.0000 mg | Freq: Once | INTRAMUSCULAR | Status: AC
  Administered 2017-04-11: 4 mg via INTRAVENOUS
  Filled 2017-04-11: qty 2

## 2017-04-11 MED ORDER — TRAMADOL HCL 50 MG PO TABS: 50.0000 mg | ORAL_TABLET | Freq: Four times a day (QID) | ORAL | 0 refills | Status: DC | PRN

## 2017-04-11 MED ORDER — CEPHALEXIN 500 MG PO CAPS
500.0000 mg | ORAL_CAPSULE | Freq: Once | ORAL | Status: AC
Start: 2017-04-11 — End: 2017-04-11
  Administered 2017-04-11: 500 mg via ORAL
  Filled 2017-04-11: qty 1

## 2017-04-11 MED ORDER — HYDROMORPHONE HCL 1 MG/ML IJ SOLN
1.0000 mg | Freq: Once | INTRAMUSCULAR | Status: AC
  Administered 2017-04-11: 1 mg via INTRAVENOUS
  Filled 2017-04-11: qty 1

## 2017-04-11 NOTE — ED Provider Notes (Signed)
[facility] DEPT Provider Note   CSN: [id number] Arrival date & time: 04/11/17  1447     History   Chief Complaint Chief Complaint  Patient presents with  . Abdominal Pain    HPI Hannah Schultz is a 56 y.o. female.  Patient complains of right lower quadrant abdominal pain and some urinary frequency   The history is provided by the patient.  Abdominal Pain   This is a recurrent problem. The current episode started 12 to 24 hours ago. The problem occurs constantly. The pain is associated with an unknown factor. The pain is located in the RLQ. The quality of the pain is dull. The pain is at a severity of 5/10. The pain is moderate. Pertinent negatives include diarrhea, frequency, hematuria and headaches.    Past Medical History:  Diagnosis Date  . Acid reflux   . Arthritis   . Bulging lumbar disc   . Depression   . Diabetes mellitus   . Diabetic neuropathy ([place])   . High cholesterol   . Hypertension   . Post traumatic stress disorder (PTSD)   . Sleep apnea     Patient Active Problem List   Diagnosis Date Noted  . Other and unspecified hyperlipidemia 09/19/2013  . Unspecified essential hypertension 09/19/2013  . Type II or unspecified type diabetes mellitus without mention of complication, uncontrolled 07/20/2013  . Trigger thumb of right hand 11/27/2012  . CTS (carpal tunnel syndrome) 11/27/2012    Past Surgical History:  Procedure Laterality Date  . ABDOMINAL HYSTERECTOMY    . CHOLECYSTECTOMY    . COLONOSCOPY    . TONSILLECTOMY  04/02/2012   Procedure: TONSILLECTOMY;  Surgeon: Ascencion Dike, MD;  Location: [facility] OR;  Service: ENT;  Laterality: Bilateral;    OB History    Gravida Para Term Preterm AB Living             2   SAB TAB Ectopic Multiple Live Births                   Home Medications    Prior to Admission medications   Medication Sig Start Date End Date Taking? Authorizing Provider  FLUoxetine (PROZAC) 20 MG tablet Take 20 mg by mouth daily.    Yes Historical Provider, MD  NOVOLIN N RELION 100 UNIT/ML injection Inject 30 Units into the skin 2 (two) times daily.  12/13/16  Yes Historical Provider, MD  ondansetron (ZOFRAN) 4 MG tablet Take 4 mg by mouth every 4 (four) hours as needed for nausea or vomiting.   Yes Historical Provider, MD  promethazine (PHENERGAN) 25 MG tablet Take 25 mg by mouth every 6 (six) hours as needed for nausea or vomiting.   Yes Historical Provider, MD  cephALEXin (KEFLEX) 500 MG capsule Take 1 capsule (500 mg total) by mouth 4 (four) times daily. 04/11/17   Milton Ferguson, MD  Cholecalciferol (VITAMIN D3) 5000 UNITS TABS Take 1 tablet by mouth daily.    Historical Provider, MD  doxycycline (VIBRAMYCIN) 100 MG capsule Take 100 mg by mouth 2 (two) times daily. 10 day course starting on 01/23/2017 01/23/17   Historical Provider, MD  gabapentin (NEURONTIN) 300 MG capsule Take 600 mg by mouth 3 (three) times daily.     Historical Provider, MD  glucose blood (ONE TOUCH ULTRA TEST) test strip 3 times per day 12/29/13   Elayne Snare, MD  Insulin Aspart (NOVOLOG FLEXPEN [place]) Inject 20 Units into the skin 3 (three) times daily.  Jacelyn Pi, MD  Insulin Degludec (TRESIBA FLEXTOUCH) 200 UNIT/ML SOPN Inject 50 Units into the skin every morning.     Jacelyn Pi, MD  Insulin Pen Needle (BD PEN NEEDLE NANO U/F) 32G X 4 MM MISC 1 each by Does not apply route 4 (four) times daily. 08/12/13   Elayne Snare, MD  metFORMIN (GLUCOPHAGE-XR) 500 MG 24 hr tablet Take 500 mg by mouth at bedtime.  01/25/17   Historical Provider, MD  nebivolol (BYSTOLIC) 5 MG tablet Take 5 mg by mouth daily.      Historical Provider, MD  ranitidine (ZANTAC) 75 MG tablet Take 75 mg by mouth at bedtime.    Historical Provider, MD  simvastatin (ZOCOR) 40 MG tablet Take 1 tablet (40 mg total) by mouth every evening. 09/22/13   Elayne Snare, MD  sitaGLIPtin (JANUVIA) 100 MG tablet Take 100 mg by mouth daily.    Historical Provider, MD  traMADol (ULTRAM) 50 MG tablet Take  1 tablet (50 mg total) by mouth every 6 (six) hours as needed. 04/11/17   Milton Ferguson, MD  trazodone (DESYREL) 300 MG tablet Take 300 mg by mouth daily as needed for sleep.     Historical Provider, MD    Family History Family History  Problem Relation Age of Onset  . Heart disease    . Cancer    . Diabetes      Social History Social History  Substance Use Topics  . Smoking status: Never Smoker  . Smokeless tobacco: Never Used  . Alcohol use No     Allergies   Adhesive [tape]   Review of Systems Review of Systems  Constitutional: Negative for appetite change and fatigue.  HENT: Negative for congestion, ear discharge and sinus pressure.   Eyes: Negative for discharge.  Respiratory: Negative for cough.   Cardiovascular: Negative for chest pain.  Gastrointestinal: Positive for abdominal pain. Negative for diarrhea.  Genitourinary: Negative for frequency and hematuria.  Musculoskeletal: Negative for back pain.  Skin: Negative for rash.  Neurological: Negative for seizures and headaches.  Psychiatric/Behavioral: Negative for hallucinations.     Physical Exam Updated Vital Signs BP (!) 90/42 (BP Location: Right Arm)   Pulse 70   Temp 98.6 F (37 C) (Oral)   Resp 18   Ht $R'5\' 5"'cD$  (1.651 m)   Wt 234 lb (106.1 kg)   SpO2 97%   BMI 38.94 kg/m   Physical Exam  Constitutional: She is oriented to person, place, and time. She appears well-developed.  HENT:  Head: Normocephalic.  Eyes: Conjunctivae and EOM are normal. No scleral icterus.  Neck: Neck supple. No thyromegaly present.  Cardiovascular: Normal rate and regular rhythm.  Exam reveals no gallop and no friction rub.   No murmur heard. Pulmonary/Chest: No stridor. She has no wheezes. She has no rales. She exhibits no tenderness.  Abdominal: She exhibits no distension. There is tenderness. There is no rebound.  Moderate right lower quadrant tenderness  Musculoskeletal: Normal range of motion. She exhibits no edema.    Lymphadenopathy:    She has no cervical adenopathy.  Neurological: She is oriented to person, place, and time. She exhibits normal muscle tone. Coordination normal.  Skin: No rash noted. No erythema.  Psychiatric: She has a normal mood and affect. Her behavior is normal.     ED Treatments / Results  Labs (all labs ordered are listed, but only abnormal results are displayed) Labs Reviewed  COMPREHENSIVE METABOLIC PANEL - Abnormal; Notable for the following:  Result Value   Potassium 5.7 (*)    Chloride 99 (*)    Glucose, Bld 179 (*)    Creatinine, Ser 1.93 (*)    Calcium 11.0 (*)    AST 45 (*)    Total Bilirubin 1.5 (*)    GFR calc non Af Amer 29 (*)    GFR calc Af Amer 33 (*)    All other components within normal limits  CBC - Abnormal; Notable for the following:    WBC 2.9 (*)    RBC 5.22 (*)    MCV 76.6 (*)    MCH 25.5 (*)    All other components within normal limits  URINALYSIS, ROUTINE W REFLEX MICROSCOPIC - Abnormal; Notable for the following:    APPearance CLOUDY (*)    Hgb urine dipstick SMALL (*)    Protein, ur 100 (*)    Leukocytes, UA MODERATE (*)    Bacteria, UA RARE (*)    Squamous Epithelial / LPF 6-30 (*)    All other components within normal limits  URINE CULTURE  LIPASE, BLOOD  POTASSIUM    EKG  EKG Interpretation None       Radiology Ct Abdomen Pelvis Wo Contrast  Result Date: 04/11/2017 CLINICAL DATA:  Right lower quadrant abdominal pain, nausea and vomiting for the past 5 days. Increased urinary frequency and burning. Previous cholecystectomy, hysterectomy and renal insufficiency. EXAM: CT ABDOMEN AND PELVIS WITHOUT CONTRAST TECHNIQUE: Multidetector CT imaging of the abdomen and pelvis was performed following the standard protocol without IV contrast. COMPARISON:  PET-CT dated 04/04/2017 and abdomen pelvis CT dated 03/06/2017. FINDINGS: Lower chest: Minimal bilateral dependent atelectasis. Hepatobiliary: Cholecystectomy clips.   Unremarkable liver. Pancreas: Unremarkable. No pancreatic ductal dilatation or surrounding inflammatory changes. Spleen: Borderline enlarged, measuring 13 cm in length, previously 14 cm in length. Adrenals/Urinary Tract: Adrenal glands are unremarkable. Kidneys are normal, without renal calculi, focal lesion, or hydronephrosis. Bladder is unremarkable. Stomach/Bowel: Small number of diverticula without evidence of diverticulitis. Normal-appearing appendix unremarkable stomach and small bowel. Vascular/Lymphatic: Minimal atheromatous aortic calcification without aneurysm. Bulky upper abdominal adenopathy is again demonstrated. The adenopathy in the hilar region of the liver, adjacent to the cholecystectomy clips, has an AP diameter of 2.9 cm on image number 24 of series 2, previously 3.0 cm in corresponding diameter on 03/06/2017. More medially, the bulky adenopathy measures 4.8 cm in AP diameter on image number 21 of series 2, previously 4.7 cm on 04/04/2017. Multiple additional smaller enlarged central mesenteric, retroperitoneal and right lower quadrant mesenteric lymph nodes have not changed significantly. Previously demonstrated right hilar and inferior mediastinal adenopathy is stable. Reproductive: Surgically absent uterus.  Unremarkable ovaries. Other: Small to moderate-sized umbilical hernia containing fat. Musculoskeletal: Lumbar and lower thoracic spine degenerative changes. Mild bilateral hip degenerative changes. IMPRESSION: 1. No acute abnormality.  Specifically, no evidence of appendicitis. 2. Stable abdominal and lower thoracic adenopathy suspicious for lymphoma. 3. Mild colonic diverticulosis. 4. Small to moderate-sized umbilical hernia containing fat. Electronically Signed   By: Claudie Revering M.D.   On: 04/11/2017 19:41    Procedures Procedures (including critical care time)  Medications Ordered in ED Medications  cephALEXin (KEFLEX) capsule 500 mg (not administered)  sodium chloride 0.9 %  bolus 1,000 mL (0 mLs Intravenous Stopped 04/11/17 1740)  ondansetron (ZOFRAN) injection 4 mg (4 mg Intravenous Given 04/11/17 1606)  HYDROmorphone (DILAUDID) injection 1 mg (1 mg Intravenous Given 04/11/17 1606)     Initial Impression / Assessment and Plan / ED Course  I  have reviewed the triage vital signs and the nursing notes.  Pertinent labs & imaging results that were available during my care of the patient were reviewed by me and considered in my medical decision making (see chart for details).     Patient with lower abdominal pain and some dysuria. CT scan shows no acute change. Patient does have lymphadenopathy that she's getting a biopsy. She'll be sent home with Ultram and Keflex. She will follow-up with her PCP  Final Clinical Impressions(s) / ED Diagnoses   Final diagnoses:  Lower abdominal pain    New Prescriptions New Prescriptions   CEPHALEXIN (KEFLEX) 500 MG CAPSULE    Take 1 capsule (500 mg total) by mouth 4 (four) times daily.   TRAMADOL (ULTRAM) 50 MG TABLET    Take 1 tablet (50 mg total) by mouth every 6 (six) hours as needed.     Milton Ferguson, MD 04/11/17 2148

## 2017-04-11 NOTE — ED Triage Notes (Signed)
PT c/o RLQ abdominal pain with nausea and vomiting that started x5 days ago. PT states last BM 04/10/17 and was normal. PT also c/o some increased urinary frequency and burning as well.

## 2017-04-11 NOTE — ED Notes (Signed)
Instructed pt to take all of antibiotics as prescribed. 

## 2017-04-11 NOTE — ED Notes (Signed)
Pt refused her CT scan

## 2017-04-11 NOTE — Discharge Instructions (Signed)
Follow-up with Dr. Luan Pulling after he finished the antibiotic.

## 2017-04-12 ENCOUNTER — Encounter: Payer: Self-pay | Admitting: *Deleted

## 2017-04-12 ENCOUNTER — Other Ambulatory Visit: Payer: Self-pay | Admitting: *Deleted

## 2017-04-12 NOTE — Patient Outreach (Addendum)
Care Coordination  [facility] CM called Mrs Deblasi to check on her after noting she was discharged from the ED on 04/11/17  Mrs Ocallaghan informed [facility] CM she received a prescription for Keflexc but her husband has not taken it to the pharmacy yet and she has been resting all day since they got home after 10 pm last night from there ED. When [facility] CM inquired if her ED MD informed her she had an infection she could not recall being told she had an infection.  She reports "I had a bottle of water today" when [facility] CM inquired about her po intake.  [facility] CM encouraged her to continue to take in fluids to prevent dehydration.  She reports a "bad taste in my mouth" even after using Listerine to swish with.and thick mucus when she opens her mouth.  [facility] CM discussed possibly a side effect to a medication (Zofran, Dilaudid), dehydration and good mouth care.  She has not taken the antibiotic at this time (eliminating after taste or side effect of the antibiotic). [facility] CM had noted in Mrs Shomaker's pill box a bottle of Tumeric on 04/11/17 home visit.  When [facility] CM inquired about them she was informed that she had "placed myself on them last week" after "my cousin told me they would help anything"  [facility] CM had advised Mrs Lisle to check with her pcp or pharmacy before starting any vitamin or herb to prevent contraindications with any of her prescribed medicine.  Mrs Henkels informed [facility] CM she would stop taking Tumeric capsules.   [facility] CM reviewed the ED AVS (no diet or exercise listed), labs and imaging. It was noted in the ED RN note that Mrs Sassaman initially refused to have a CT of her abdomen but later consented.  [facility] CM noted "Previously demonstrated right hilar and inferior mediastinal adenopathy is stable. " "Small to moderate-sized umbilical hernia containing fat", and "Mild colonic diverticulosis"mentioned on impression of the CT of the abdomen and very slight elevations of her potassium, creatinine and  Calcium.   The ED MD diagnosed  abdominal pain only and discharged her with Keflex and tramadol per notes.  Pt was given Dilaudid, IV fluids and zofran in the ED.    Mrs Yuhasz hs not made a follow up appointment to pcp Dr Luan Pulling even after she informed [facility] CM she understood the discharge instructions   Plans:  [facility] CM will contact Mrs Pope for transition of care call on next week  Routed note to pcp and endocrinologist  Kimberly [name], RN, BSN, [facility] [phone]

## 2017-04-12 NOTE — Patient Outreach (Signed)
[facility]) Care Management   04/12/2017  PELAGIA IACOBUCCI 1961-05-31 [id number]  JIMESHA RISING is an 56 y.o. female being followed by [facility] CM for care coordination and education related DM management, and recent present diagnostic tests and procedures (abdominal pain, cough since December 2017) and has been seen previously by [place] for medication assistance. Mrs Lagrange has a PMH of DM Type II, hypertension, hyperlipidemia, carpal tunnel syndrome, and trigger thumb of right hand.   Recent nonspecific chest pain and mediastinal adenopathy indicated recommendation for follow up with pcp for the evaluation of the 01/29/17 abnormal Chest CT imaging that showed "Enlarged mediastinal and bilateral hilar nodes, measuring up to 2.2 cm. Enlarged nodes at the upper abdomen, about the celiac trunk. This could reflect lymphoma, sarcoidosis or a variety of inflammatory conditions. The appearance is less typical for metastatic disease. A number of the mediastinal nodes would be amenable to biopsy, as deemed clinically appropriate." Plus "minimal bibasilar atelectasis noted. Lungs otherwise clear."   Subjective:  "I feel terrible" (but unable to give CM further description until CM asked her questions. " " I was told by people it was better to eat then to waste away" in response to  Hospital CM inquiry about why she continues to force herself to eat the apple sauce in front of her if she felt "that It's going to come back up"  [facility] CM encouraged her to stop repeating this pattern of eating and vomiting.  Objective:   BP 120/84 (BP Location: Left Arm, Patient Position: Sitting, Cuff Size: Large)   Pulse 78   Temp 98.3 F (36.8 C) (Oral)   Wt 234 lb (106.1 kg)   SpO2 98%   BMI 38.94 kg/m  Review of Systems  Constitutional: Negative for chills, diaphoresis, fever, malaise/fatigue and weight loss.  HENT: Positive for ear pain. Negative for congestion, ear discharge, hearing loss,  nosebleeds, sinus pain, sore throat and tinnitus.   Eyes: Positive for blurred vision. Negative for double vision, photophobia, pain, discharge and redness.  Respiratory: Positive for cough. Negative for hemoptysis, sputum production, shortness of breath, wheezing and stridor.   Cardiovascular: Negative.  Negative for chest pain, palpitations, orthopnea, claudication, leg swelling and PND.  Gastrointestinal: Positive for abdominal pain, diarrhea, nausea and vomiting. Negative for blood in stool, constipation, heartburn and melena.  Genitourinary: Positive for dysuria and urgency. Negative for flank pain, frequency and hematuria.  Musculoskeletal: Positive for falls. Negative for back pain, joint pain, myalgias and neck pain.       Pt reports a fall "awhile back over my grandson toy"  Skin: Negative.  Negative for itching and rash.  Neurological: Positive for dizziness, weakness and headaches. Negative for tingling, tremors, sensory change, speech change, focal weakness, seizures and loss of consciousness.  Endo/Heme/Allergies: Negative for environmental allergies and polydipsia. Bruises/bleeds easily.  Psychiatric/Behavioral: Negative for depression, hallucinations, memory loss, substance abuse and suicidal ideas. The patient is not nervous/anxious and does not have insomnia.     Physical Exam  Constitutional: She is oriented to person, place, and time. She appears well-developed and well-nourished.  HENT:  Head: Normocephalic and atraumatic.  Right Ear: External ear normal.  Left Ear: External ear normal.  Nose: Nose normal.  Mouth/Throat: Oropharynx is clear and moist.  Eyes: Conjunctivae and EOM are normal. Pupils are equal, round, and reactive to light.  Neck: Normal range of motion. Neck supple.  Cardiovascular: Normal rate, regular rhythm, normal heart sounds and intact distal pulses.   Respiratory:  Effort normal and breath sounds normal.  GI: Bowel sounds are normal. There is  tenderness.    Musculoskeletal: Normal range of motion.  Neurological: She is alert and oriented to person, place, and time.  Skin: Skin is warm and dry.  Psychiatric: She has a normal mood and affect. Her behavior is normal. Judgment and thought content normal.    Encounter Medications:   Outpatient Encounter Prescriptions as of 04/11/2017  Medication Sig Note  . Cholecalciferol (VITAMIN D3) 5000 UNITS TABS Take 1 tablet by mouth daily.   Marland Kitchen FLUoxetine (PROZAC) 20 MG tablet Take 20 mg by mouth daily.   Marland Kitchen gabapentin (NEURONTIN) 300 MG capsule Take 600 mg by mouth 3 (three) times daily.    Marland Kitchen glucose blood (ONE TOUCH ULTRA TEST) test strip 3 times per day   . Insulin Aspart (NOVOLOG FLEXPEN [place]) Inject 20 Units into the skin 3 (three) times daily.  12/20/2016: Now taking 30 units TID as per MD  . Insulin Degludec (TRESIBA FLEXTOUCH) 200 UNIT/ML SOPN Inject 50 Units into the skin every morning.  04/11/2017: Has not filled since 01/2017  . Insulin Pen Needle (BD PEN NEEDLE NANO U/F) 32G X 4 MM MISC 1 each by Does not apply route 4 (four) times daily.   . nebivolol (BYSTOLIC) 5 MG tablet Take 5 mg by mouth daily.     Marland Kitchen NOVOLIN N RELION 100 UNIT/ML injection Inject 30 Units into the skin 2 (two) times daily.    . promethazine (PHENERGAN) 25 MG tablet Take 25 mg by mouth every 6 (six) hours as needed for nausea or vomiting.   . ranitidine (ZANTAC) 75 MG tablet Take 75 mg by mouth at bedtime.   . simvastatin (ZOCOR) 40 MG tablet Take 1 tablet (40 mg total) by mouth every evening.   . sitaGLIPtin (JANUVIA) 100 MG tablet Take 100 mg by mouth daily.   . traMADol (ULTRAM) 50 MG tablet Take 1 tablet (50 mg total) by mouth every 6 (six) hours as needed.   . trazodone (DESYREL) 300 MG tablet Take 300 mg by mouth daily as needed for sleep.  04/11/2017: Never filled per pharmacy  . [DISCONTINUED] Insulin NPH Human, Isophane, (NOVOLIN N [place]) Inject 30 Units into the skin 2 (two) times daily. Sliding scale    .  [DISCONTINUED] methylPREDNISolone (MEDROL DOSEPAK) 4 MG TBPK tablet Take 4-32 mg by mouth See admin instructions. Take as directed per package instructions (6,5,4,3,2,1)   . doxycycline (VIBRAMYCIN) 100 MG capsule Take 100 mg by mouth 2 (two) times daily. 10 day course starting on 01/23/2017   . metFORMIN (GLUCOPHAGE-XR) 500 MG 24 hr tablet Take 500 mg by mouth at bedtime.  04/11/2017: Has not filled since 01/2017 per pharmacy records   No facility-administered encounter medications on file as of 04/11/2017.     Assessment:    Acute Health Condition Mrs Lampi was noted to be sitting at her living room during this [facility] CM home visit with her sister Hassan Rowan. She is in a dress and robe with her head lying on there kitchen table attempting to eat strawberry apple sauce.  She and her husband reports she has continued to "throw up everything she takes in", "everything comes back up" Mrs Apollo had called [facility] CM and left a message on Saturday 04/06/17 and she was contacted on 04/08/17 by [facility] CM  (refer to [place] notes). Mrs Garguilo has not attempted to go to ED.  During [facility] CM assessment today CM encouraged her to  go to there ED and to stop taking in solids at this time and start a clear liquid diet to given her stomach a rest.  At one moment in assessment Mrs Marovich said in a loud tone "If I die I'm going to sue everybody"  Hassan Rowan her sister informed Mrs Barb that this was not appropriate if she did not try to  seek treatment.  [facility]N CM discussed this outburst is related to all of Mrs Obarr recent tests and procedures and support services are available when Mrs Appleby agrees to services.  During the previous [facility] CM home visit on 03/14/17 [place] SW visited with [facility] CM (on a ride along visit) and offered resources but they were refused by Mrs Jann. Before Mrs Ansley had her vomiting episode she left there dining room table to go lie down on her living room couch.  GI issues-Reports issues with abdominal  symptoms (nausea, vomiting, diarrhea, abdominal pain).  THN CM answered questions about her upcoming consult/pre op appointment listed in EPIC for 04/16/17 related to surgical biopsy (mediastinoscopy).  Mrs Bowersox informed CM that it was on "may thirteenth" but [facility] CM made her aware that May 13th was on a Sunday.  THN CM encouraged her to contact the pcp office to check.  THN CM weighed Mrs Corey to document weight of 234 lbs.   Chronic Health Condition (DM, Hypertension, hyperlipidemia) Her DM, hypertension and hyperlipidemia are being managed with medications and decrease in appetite.  She reports she had taken her medications but has thrown them up.  CM unable to check Mrs [facility] today because she had an acute vomiting episode.  Her Vital signs were WNL during the assessment   Plan:  THN CM Assessed Mrs Sandoval abdominal symptoms and recommended a visit to the ED or a clear liquid diet to prevent further vomiting THN CM left message for Dr Luan Pulling RN , [place] and [facility] referral scheduler and attempted to reach a staff at Dr Kathaleen Grinder office to discuss Mrs Son symptoms and request recommendations [facility] CM provided comfort measures while Mrs Butzer had vomiting episodes and again encouraged her to go to ED for evaluation [facility] CM stopped by Dr Luan Pulling office en route to following Mr & Mrs Folger to [facility] ED to inform staff that Mrs Cowley was on her way to ED for repeated emesis THN CM provided Mrs Taves with CM business card and [facility] 24 hour nurse line contact number [facility] CM provided Mrs Caradonna with a written list of clear liquids and a 3 day menu of examples of breakfast, lunch and dinner clear liquid meals THN CM received a return call from [place] at Dr Luan Pulling office to updated her that Mrs Kammerer was in the ED while speaking with Mr Domzalski at there ED [facility] CM routed note to pcp, endocrinologist  [facility] CM Care Plan Problem One     Most Recent Value  Care Plan Problem One  (P) Knowledge  Deficit related to self health management of Diabetes Mellitus, Type 2  Role Documenting the Problem One  (P) Care Management [place] for Problem One  (P) Active  THN Long Term Goal (31-90 days)  (P) Over the next 60 days, patient will verbalize understanding of changes to diet, medication, and exercise regimen as recommended by her endocrinologist and CDE/RD  [facility] Long Term Goal Start Date  (P) 03/28/17  Interventions for Problem One Long Term Goal  (P) Utilizing teachback method, discussed at length  most recent changes to DIabetes treatment plan changes  THN CM Short Term Goal #1 (0-30 days)  (P) Over the next 7 days,  patient will impletment medication dose change as prescribed and will notify provider with any side effects or cbg findings outside established parameters  THN CM Short Term Goal #1 Start Date  (P) 01/25/17  THN CM Short Term Goal #1 Met Date  (P) 02/05/17  Interventions for Short Term Goal #1  (P) medications reviewed w/ changes to insulin dose  THN CM Short Term Goal #2 (0-30 days)  (P) Over the next 30 days patient will verbaliize understanding of possible weight reduction surgery as strategy for management of DM  THN CM Short Term Goal #2 Start Date  (P) 01/25/17  THN CM Short Term Goal #2 Met Date  (P) 02/19/17  Interventions for Short Term Goal #2  (P) discussed with patient by phone recommendations as made by PCP/PULM?ENDO,  scheudled visit with patient and plan for education re: weight loss surgery, EMMI article, wt loss surgery book for pt to review    [facility] CM Care Plan Problem Two     Most Recent Value  Care Plan Problem Two  (P) acute health condition-chest pain requiring ED visit  Role Documenting the Problem Two  (P) Care Management [place] for Problem Two  (P) Active  Interventions for Problem Two Long Term Goal   (P) brief pt examination in home recommended and facilitated ED visit  THN Long Term Goal (31-90) days  (P) over the next 45  days pt will verbalize understanding of plan of care for management of acute chest pain, leg swelling and GI symptoms  THN Long Term Goal Start Date  (P) 01/29/17  THN Long Term Goal Met Date  (P) 03/28/17  THN CM Short Term Goal #1 (0-30 days)  (P) Over the next 7 days, patient will see pcp/pulmonary provider  as recommended  THN CM Short Term Goal #1 Start Date  (P) 01/30/17  THN CM Short Term Goal #1 Met Date   (P) 02/12/17  Interventions for Short Term Goal #2   (P) assisted patient with pcp/pulmonary provider appointment scheduling,  update not to PCP    [facility] CM Care Plan Problem Three     Most Recent Value  Care Plan Problem Three  (P) Acute Health Condition (peri-umbilical pain and early satiety)  Role Documenting the Problem Three  (P) Care Management Coordinator  Care Plan for Problem Three  (P) Active  THN Long Term Goal (31-90) days  (P) Over the next 45 days, patient will verbalize understsanding of plan of care for acute health condition  THN Long Term Goal Start Date  (P) 02/19/17  THN Long Term Goal Met Date  (P) 03/28/17  Interventions for Problem Three Long Term Goal  (P) Discussed new symptoms and forwarded assessment information to PCP  [facility] CM Short Term Goal #1 (0-30 days)  (P) over the next 30 days the patine will have a PET sccan and be scheduled for a biopsy to assist with definitive medical diagnosis  THN CM Short Term Goal #1 Start Date  (P) 03/28/17  Interventions for Short Term Goal #1  (P) reveiw mediastinoscopy,PET scan, sarcoidosis, lymphoma in detaiil, answer questoin ans re educate as needed         [name], RN, BSN, [place] Care Management [phone]

## 2017-04-13 LAB — URINE CULTURE

## 2017-04-16 ENCOUNTER — Encounter: Payer: Self-pay | Admitting: Thoracic Surgery (Cardiothoracic Vascular Surgery)

## 2017-04-16 ENCOUNTER — Ambulatory Visit (INDEPENDENT_AMBULATORY_CARE_PROVIDER_SITE_OTHER): Payer: PPO | Admitting: Thoracic Surgery (Cardiothoracic Vascular Surgery)

## 2017-04-16 ENCOUNTER — Other Ambulatory Visit: Payer: Self-pay | Admitting: *Deleted

## 2017-04-16 VITALS — BP 113/75 | HR 67 | Resp 16 | Ht 65.0 in | Wt 255.0 lb

## 2017-04-16 DIAGNOSIS — R590 Localized enlarged lymph nodes: Secondary | ICD-10-CM

## 2017-04-16 NOTE — Progress Notes (Signed)
PCP is Sinda Du, MD Referring Provider is Sinda Du, MD  Chief Complaint  Patient presents with  . Adenopathy    thoracic/abdominal per CT CHEST 01/29/17 PET 04/04/17    HPI: Mrs. Porche is sent for consultation regarding mediastinal adenopathy.  Mrs. Bramlett is a 56 year old woman with a past medical history significant for diabetes complicated by neuropathy, hypertension, hypercholesterolemia, depression, gastroesophageal reflux, arthritis, posterior max stress disorder, and sleep apnea. She's been feeling poorly since the first of the year. She's lost her appetite and has lost about 40 pounds over the past 3 months. She has had a cough since December. She has had some chills but no fevers or night sweats.  She had a CT done which showed widespread adenopathy, including the mediastinal and hilar lymph nodes and upper abdominal lymph nodes. A PET CT showed hypermetabolic activity in her enlarged lymph nodes. She was scheduled for CT-guided biopsy of intra-abdominal lymph nodes, but the radiologist did not feel it to be done safely. She now is for consideration for mediastinoscopy.  Zubrod Score: At the time of surgery this patient's most appropriate activity status/level should be described as: $RemoveBefor'[]'NnIyrfRSQLyT$     0    Normal activity, no symptoms $RemoveBef'[]'QqGCIYsaKS$     1    Restricted in physical strenuous activity but ambulatory, able to do out light work $RemoveBe'[x]'cTBjfePYx$     2    Ambulatory and capable of self care, unable to do work activities, up and about >50 % of waking hours                              '[]'$     3    Only limited self care, in bed greater than 50% of waking hours $RemoveBefo'[]'bfoWPDOQOrP$     4    Completely disabled, no self care, confined to bed or chair $Remove'[]'WgohKuI$     5    Moribund  Past Medical History:  Diagnosis Date  . Acid reflux   . Arthritis   . Bulging lumbar disc   . Depression   . Diabetes mellitus   . Diabetic neuropathy ([place])   . High cholesterol   . Hypertension   . Post traumatic stress disorder (PTSD)   .  Sleep apnea     Past Surgical History:  Procedure Laterality Date  . ABDOMINAL HYSTERECTOMY    . CHOLECYSTECTOMY    . COLONOSCOPY    . TONSILLECTOMY  04/02/2012   Procedure: TONSILLECTOMY;  Surgeon: Ascencion Dike, MD;  Location: [facility] OR;  Service: ENT;  Laterality: Bilateral;    Family History  Problem Relation Age of Onset  . Heart disease    . Cancer    . Diabetes      Social History Social History  Substance Use Topics  . Smoking status: Never Smoker  . Smokeless tobacco: Never Used  . Alcohol use No    Current Outpatient Prescriptions  Medication Sig Dispense Refill  . Cholecalciferol (VITAMIN D3) 5000 UNITS TABS Take 1 tablet by mouth daily.    Marland Kitchen FLUoxetine (PROZAC) 20 MG tablet Take 20 mg by mouth daily.    Marland Kitchen gabapentin (NEURONTIN) 300 MG capsule Take 600 mg by mouth 3 (three) times daily.     Marland Kitchen glucose blood (ONE TOUCH ULTRA TEST) test strip 3 times per day 100 each 0  . Insulin Aspart (NOVOLOG FLEXPEN [place]) Inject 20 Units into the skin 3 (three) times daily.     . Insulin  Degludec (TRESIBA FLEXTOUCH) 200 UNIT/ML SOPN Inject 50 Units into the skin every morning.     . Insulin Pen Needle (BD PEN NEEDLE NANO U/F) 32G X 4 MM MISC 1 each by Does not apply route 4 (four) times daily. 150 each 3  . nebivolol (BYSTOLIC) 5 MG tablet Take 5 mg by mouth daily.      Marland Kitchen NOVOLIN N RELION 100 UNIT/ML injection Inject 30 Units into the skin 2 (two) times daily.     . ondansetron (ZOFRAN) 4 MG tablet Take 4 mg by mouth every 4 (four) hours as needed for nausea or vomiting.    . promethazine (PHENERGAN) 25 MG tablet Take 25 mg by mouth every 6 (six) hours as needed for nausea or vomiting.    . ranitidine (ZANTAC) 75 MG tablet Take 75 mg by mouth at bedtime.    . simvastatin (ZOCOR) 40 MG tablet Take 1 tablet (40 mg total) by mouth every evening. 30 tablet 5  . sitaGLIPtin (JANUVIA) 100 MG tablet Take 100 mg by mouth daily.     No current facility-administered medications for this visit.      Allergies  Allergen Reactions  . Adhesive [Tape] Other (See Comments)    Adhesive tape "tears skin"    Review of Systems  Constitutional: Positive for activity change, appetite change, chills and unexpected weight change (Lost 40 pounds in 3 months). Negative for diaphoresis and fever.  HENT: Positive for trouble swallowing. Negative for voice change.   Eyes: Negative for visual disturbance.  Respiratory: Positive for apnea and wheezing (2 months ago).   Cardiovascular: Negative for chest pain and leg swelling.  Gastrointestinal: Positive for abdominal pain (Frequent heartburn). Negative for blood in stool.  Genitourinary: Negative for difficulty urinating and dysuria.  Musculoskeletal: Positive for arthralgias and joint swelling.       Chronic pain in legs  Skin:       Itching  Neurological: Positive for numbness (Feet and legs). Negative for weakness.       Chronic pain in legs  Hematological: Positive for adenopathy. Does not bruise/bleed easily.  Psychiatric/Behavioral: Positive for dysphoric mood. The patient is not nervous/anxious.   All other systems reviewed and are negative.   BP 113/75 (BP Location: Left Arm, Patient Position: Sitting, Cuff Size: Large)   Pulse 67   Resp 16   Ht $R'5\' 5"'Xf$  (1.651 m)   Wt 255 lb (115.7 kg)   SpO2 (!) 86% Comment: ON RA  BMI 42.43 kg/m  Physical Exam  Constitutional: She is oriented to person, place, and time. No distress.  Obese 56 year old woman in wheelchair  HENT:  Head: Normocephalic and atraumatic.  Mouth/Throat: No oropharyngeal exudate.  Eyes: Conjunctivae and EOM are normal. No scleral icterus.  Neck: Neck supple. No thyromegaly present.  Unable to palpate adenopathy, likely due to body habitus  Cardiovascular: Normal rate, regular rhythm and normal heart sounds.   No murmur heard. Pulmonary/Chest: Effort normal and breath sounds normal. No respiratory distress. She has no wheezes. She has no rales.  Abdominal: Soft.  There is no tenderness.  Obese  Musculoskeletal: She exhibits no edema.  Lymphadenopathy:    She has no cervical adenopathy.  Neurological: She is alert and oriented to person, place, and time. No cranial nerve deficit. She exhibits normal muscle tone.  Skin: Skin is warm and dry.  Vitals reviewed.    Diagnostic Tests: CT CHEST WITH CONTRAST  TECHNIQUE: Multidetector CT imaging of the chest was performed during intravenous contrast  administration.  CONTRAST:  44mL ISOVUE-300 IOPAMIDOL (ISOVUE-300) INJECTION 61%  COMPARISON:  Chest radiograph performed earlier today at 3:15 p.m.  FINDINGS: Cardiovascular: The heart is normal in size. The thoracic aorta is unremarkable. The great vessels are within normal limits. No calcific atherosclerotic disease is seen.  Mediastinum/Nodes: Enlarged mediastinal and bilateral hilar nodes are seen, measuring up to 1.8 cm at the mediastinum, 2.2 cm at the right hilum and 1.5 cm at the left hilum. Enlarged mediastinal nodes are noted at the right paratracheal, periaortic and subcarinal regions. No pericardial effusion is identified. The thyroid gland contains minimal hypodensities, but is otherwise unremarkable. No axillary lymphadenopathy is seen.  Lungs/Pleura: Minimal bibasilar atelectasis is noted. The lungs are otherwise clear. No pleural effusion or pneumothorax is seen. No masses are identified.  Upper Abdomen: The visualized portions of the liver are unremarkable. The spleen is somewhat bulky, but otherwise grossly unremarkable. The patient is status post cholecystectomy, with clips noted at the gallbladder fossa. The visualized portions of the pancreas, adrenal glands and kidneys are within normal limits. Prominent nodes are seen about the celiac trunk, measuring up to 1.5 cm in short axis.  Musculoskeletal: No acute osseous abnormalities are identified. The visualized musculature is unremarkable in  appearance.  IMPRESSION: 1. Enlarged mediastinal and bilateral hilar nodes, measuring up to 2.2 cm. Enlarged nodes at the upper abdomen, about the celiac trunk. This could reflect lymphoma, sarcoidosis or a variety of inflammatory conditions. The appearance is less typical for metastatic disease. A number of the mediastinal nodes would be amenable to biopsy, as deemed clinically appropriate. 2. Minimal bibasilar atelectasis noted.  Lungs otherwise clear.   Electronically Signed   By: Garald Balding M.D.   On: 01/29/2017 21:58 NUCLEAR MEDICINE PET SKULL BASE TO THIGH  TECHNIQUE: 12.6 mCi F-18 FDG was injected intravenously. Full-ring PET imaging was performed from the skull base to thigh after the radiotracer. CT data was obtained and used for attenuation correction and anatomic localization.  FASTING BLOOD GLUCOSE:  Value: 156 mg/dl  COMPARISON:  CT on 01/29/2017  FINDINGS: NECK  10 mm hypermetabolic scalp lymph node seen in the right postauricular region on image 11/4, with SUV max of 9.7. Small hypermetabolic lymph nodes are seen in both parotid glands, largest on the right measuring 9 mm with SUV max of 11.7. Tiny hypermetabolic lymph node is seen in the left submandibular region, level 1B, and hypermetabolic bilateral supraclavicular lymph nodes are seen, largest on the right measuring 12 mm on image 44/4, with SUV max of 16.9. A 6 mm hypermetabolic lymph node is seen in the left upper posterior chest wall deep subcutaneous tissues.  CHEST  Hypermetabolic lymphadenopathy is seen throughout the mediastinum and bilateral hilar regions. Index lymph node in right paratracheal region on 59/4 has SUV max of 24.3. No hypermetabolic axillary lymph nodes identified.  Bilateral upper lobe pulmonary nodules are increased in size, largest in the right upper lobe measuring 6 mm on image 28/8, and in the left upper lobe measuring 6 mm on image 27/8. These show no  FDG uptake, but are too small to characterize by CT.  ABDOMEN/PELVIS  No abnormal hypermetabolic activity within the liver, pancreas, adrenal glands, or spleen.  Hypermetabolic lymphadenopathy is seen within the porta hepatis, gastrohepatic ligament and retroperitoneum. Largest nodal mass in the porta hepatis measures 4.7 cm on image 98/4, with SUV max of 22.8. Small less than 1 cm hypermetabolic lymph nodes are also seen within the small bowel mesentery and the right  common iliac chain.  SKELETON  Several hypermetabolic foci are seen along the pleural spaces and several posterior ribs bilaterally. No corresponding bone lesion or soft tissue nodules are identified. This may be due to pleural or osseous involvement.  IMPRESSION: Hypermetabolic lymphadenopathy throughout the neck, chest, and abdomen, suspicious for lymphoma.  Sub-cm bilateral upper lobe pulmonary nodules show no FDG uptake, but are too small to characterize by CT. Recommend continued attention on follow-up CT.  Several hypermetabolic foci along the bilateral pleural spaces and posterior ribs, without corresponding abnormality on CT images. These may be due to pleural or osseous involvement.   Electronically Signed   By: Earle Gell M.D.   On: 04/04/2017 17:06 I personally reviewed the CT chest and PET/CT concur with the findings noted above   Impression: Mrs. Hannah Schultz is a 56 year old woman who has been ill for several months with cough, chills, general malaise, loss of appetite, and extreme weight loss (40 pounds). Workup has revealed extensive adenopathy in both the chest and abdomen. The intra-abdominal pathology was not felt suitable for biopsy due to the need to cross the liver.  Differential diagnosis is primarily lymphoma versus sarcoidosis. I think lymphoma is far more likely in her case. A tissue diagnosis is needed to allow for appropriate therapy to be given. Mediastinoscopy is certainly a  reasonable option in this case. Although there is no absolute guarantee of the diagnosis, I think it would be highly unusual not to get a diagnosis given the accessibility of the lymph nodes.  I described the procedure of mediastinoscopy to Mrs. Gabrys, her husband and her sister. They understand this is an operative procedure and will involve an incision and general anesthesia. We will plan to do it on an outpatient basis. They understand it is diagnostic and not therapeutic. We discussed the indications, risks, benefits, and alternatives. They understand the risk include, but are not limited to death, MI, DVT, PE, stroke, bleeding, possible need for transfusion or thoracotomy, infection, the thorax, recurrent nerve injury leading to hoarseness, esophageal injury, as well as possibility of other unforeseeable complications.  She understands and accepts the risks and wishes to proceed.  Plan: Mediastinoscopy on Thursday, 04/25/2017  Melrose Nakayama, MD [facility] [phone]

## 2017-04-19 ENCOUNTER — Telehealth: Payer: Self-pay | Admitting: *Deleted

## 2017-04-19 NOTE — Patient Outreach (Signed)
[facility]) Care Management  04/19/2017  TERI LEGACY 1961-04-09 [id number]   Care Coordination   [facility] CM followed up with Mrs Oliveto after not hearing from her this week and she was recently d/c from ED on 04/11/17  [facility] CM had followed up with her on 04/12/17  Mrs Whidby continues to report that she is "throwing up"  She reports drinking lots of fluids and had made attempts to eat solids without success She confirms she has not made an attempt to call Dr Luan Pulling, pcp,has nausea medication (route is po) but she has thrown nausea and antibiotic medicines up also  [facility] CM recommended she call pcp to get suppository or sublingual nausea medicine (another route) that will be able to assist with managing her nausea and can also begin to control the "frothy stuff in my mouth" Discuss the frothy secretions may be related to emeses cause GERD or dehydration  She confirms she has been trying to rinse with luke warm "salt water" but still having issues CM again discussed taking in lukewarm fluids versus extremely cold or warm fluids to prevent irritation to her stomach She confirms she has her pre op appointment on 04/23/17 and her surgery on 04/25/17 for Mediastinal adenopathy as listed in EPIC and agrees to a [facility] CM home visit on 04/26/17 afternoon  Plan to see Mrs Lint for home visit on next week  Jamesen Stahnke [name], RN, BSN, [place] Care Management [phone]

## 2017-04-23 ENCOUNTER — Encounter (HOSPITAL_COMMUNITY): Payer: Self-pay

## 2017-04-23 ENCOUNTER — Emergency Department (HOSPITAL_COMMUNITY)
Admission: EM | Admit: 2017-04-23 | Discharge: 2017-04-23 | Disposition: A | Discharge status: Home / Self Care | Payer: PPO | Attending: Physician Assistant | Admitting: Physician Assistant

## 2017-04-23 ENCOUNTER — Encounter (HOSPITAL_COMMUNITY)
Admission: RE | Admit: 2017-04-23 | Discharge: 2017-04-23 | Disposition: A | Discharge status: Home / Self Care | Payer: PPO | Source: Ambulatory Visit | Attending: Thoracic Surgery (Cardiothoracic Vascular Surgery) | Admitting: Thoracic Surgery (Cardiothoracic Vascular Surgery)

## 2017-04-23 ENCOUNTER — Ambulatory Visit (HOSPITAL_COMMUNITY)
Admission: RE | Admit: 2017-04-23 | Discharge: 2017-04-23 | Disposition: A | Discharge status: Home / Self Care | Payer: PPO | Source: Ambulatory Visit | Attending: Thoracic Surgery (Cardiothoracic Vascular Surgery) | Admitting: Thoracic Surgery (Cardiothoracic Vascular Surgery)

## 2017-04-23 DIAGNOSIS — R55 Syncope and collapse: Secondary | ICD-10-CM | POA: Diagnosis not present

## 2017-04-23 DIAGNOSIS — E119 Type 2 diabetes mellitus without complications: Secondary | ICD-10-CM | POA: Insufficient documentation

## 2017-04-23 DIAGNOSIS — Z01818 Encounter for other preprocedural examination: Secondary | ICD-10-CM | POA: Insufficient documentation

## 2017-04-23 DIAGNOSIS — I10 Essential (primary) hypertension: Secondary | ICD-10-CM | POA: Insufficient documentation

## 2017-04-23 DIAGNOSIS — Z794 Long term (current) use of insulin: Secondary | ICD-10-CM | POA: Insufficient documentation

## 2017-04-23 DIAGNOSIS — E860 Dehydration: Secondary | ICD-10-CM

## 2017-04-23 DIAGNOSIS — Y9389 Activity, other specified: Secondary | ICD-10-CM | POA: Insufficient documentation

## 2017-04-23 DIAGNOSIS — W19XXXA Unspecified fall, initial encounter: Secondary | ICD-10-CM | POA: Diagnosis not present

## 2017-04-23 DIAGNOSIS — Z01812 Encounter for preprocedural laboratory examination: Secondary | ICD-10-CM | POA: Insufficient documentation

## 2017-04-23 DIAGNOSIS — Y929 Unspecified place or not applicable: Secondary | ICD-10-CM | POA: Diagnosis not present

## 2017-04-23 DIAGNOSIS — Z79899 Other long term (current) drug therapy: Secondary | ICD-10-CM | POA: Insufficient documentation

## 2017-04-23 DIAGNOSIS — G4733 Obstructive sleep apnea (adult) (pediatric): Secondary | ICD-10-CM | POA: Diagnosis not present

## 2017-04-23 DIAGNOSIS — Z0183 Encounter for blood typing: Secondary | ICD-10-CM | POA: Insufficient documentation

## 2017-04-23 DIAGNOSIS — R590 Localized enlarged lymph nodes: Secondary | ICD-10-CM

## 2017-04-23 DIAGNOSIS — Y999 Unspecified external cause status: Secondary | ICD-10-CM | POA: Insufficient documentation

## 2017-04-23 DIAGNOSIS — F4310 Post-traumatic stress disorder, unspecified: Secondary | ICD-10-CM | POA: Diagnosis not present

## 2017-04-23 DIAGNOSIS — E785 Hyperlipidemia, unspecified: Secondary | ICD-10-CM | POA: Insufficient documentation

## 2017-04-23 DIAGNOSIS — R531 Weakness: Secondary | ICD-10-CM | POA: Diagnosis not present

## 2017-04-23 DIAGNOSIS — K219 Gastro-esophageal reflux disease without esophagitis: Secondary | ICD-10-CM | POA: Insufficient documentation

## 2017-04-23 LAB — TYPE AND SCREEN
ABO/RH(D): B POS
Antibody Screen: NEGATIVE

## 2017-04-23 LAB — CBC
HCT: 39.9 % (ref 36.0–46.0)
Hemoglobin: 13.1 g/dL (ref 12.0–15.0)
MCH: 25 pg — ABNORMAL LOW (ref 26.0–34.0)
MCHC: 32.8 g/dL (ref 30.0–36.0)
MCV: 76.1 fL — AB (ref 78.0–100.0)
PLATELETS: 209 10*3/uL (ref 150–400)
RBC: 5.24 MIL/uL — AB (ref 3.87–5.11)
RDW: 15.3 % (ref 11.5–15.5)
WBC: 2.6 10*3/uL — AB (ref 4.0–10.5)

## 2017-04-23 LAB — URINALYSIS, ROUTINE W REFLEX MICROSCOPIC
Bilirubin Urine: NEGATIVE
Glucose, UA: NEGATIVE mg/dL
Hgb urine dipstick: NEGATIVE
Ketones, ur: NEGATIVE mg/dL
NITRITE: NEGATIVE
Protein, ur: 100 mg/dL — AB
SPECIFIC GRAVITY, URINE: 1.012 (ref 1.005–1.030)
pH: 5 (ref 5.0–8.0)

## 2017-04-23 LAB — COMPREHENSIVE METABOLIC PANEL
ALBUMIN: 3.8 g/dL (ref 3.5–5.0)
ALT: 30 U/L (ref 14–54)
AST: 40 U/L (ref 15–41)
Alkaline Phosphatase: 62 U/L (ref 38–126)
Anion gap: 11 (ref 5–15)
BUN: 12 mg/dL (ref 6–20)
CHLORIDE: 98 mmol/L — AB (ref 101–111)
CO2: 24 mmol/L (ref 22–32)
CREATININE: 1.87 mg/dL — AB (ref 0.44–1.00)
Calcium: 11.2 mg/dL — ABNORMAL HIGH (ref 8.9–10.3)
GFR calc Af Amer: 34 mL/min — ABNORMAL LOW (ref >60)
GFR calc non Af Amer: 30 mL/min — ABNORMAL LOW (ref >60)
GLUCOSE: 224 mg/dL — AB (ref 65–99)
Potassium: 4 mmol/L (ref 3.5–5.1)
Sodium: 133 mmol/L — ABNORMAL LOW (ref 135–145)
Total Bilirubin: 1.5 mg/dL — ABNORMAL HIGH (ref 0.3–1.2)
Total Protein: 7.9 g/dL (ref 6.5–8.1)

## 2017-04-23 LAB — PROTIME-INR
INR: 1.19
PROTHROMBIN TIME: 15.2 s (ref 11.4–15.2)

## 2017-04-23 LAB — ABO/RH: ABO/RH(D): B POS

## 2017-04-23 LAB — CBG MONITORING, ED: Glucose-Capillary: 213 mg/dL — ABNORMAL HIGH (ref 65–99)

## 2017-04-23 LAB — SURGICAL PCR SCREEN
MRSA, PCR: NEGATIVE
Staphylococcus aureus: NEGATIVE

## 2017-04-23 LAB — GLUCOSE, CAPILLARY
GLUCOSE-CAPILLARY: 196 mg/dL — AB (ref 65–99)
Glucose-Capillary: 204 mg/dL — ABNORMAL HIGH (ref 65–99)

## 2017-04-23 LAB — APTT: APTT: 37 s — AB (ref 24–36)

## 2017-04-23 MED ORDER — METOCLOPRAMIDE HCL 5 MG/ML IJ SOLN
10.0000 mg | Freq: Once | INTRAMUSCULAR | Status: AC
  Administered 2017-04-23: 10 mg via INTRAVENOUS
  Filled 2017-04-23: qty 2

## 2017-04-23 MED ORDER — SODIUM CHLORIDE 0.9 % IV BOLUS (SEPSIS)
1000.0000 mL | Freq: Once | INTRAVENOUS | Status: AC
  Administered 2017-04-23: 1000 mL via INTRAVENOUS

## 2017-04-23 MED ORDER — METOCLOPRAMIDE HCL 10 MG PO TABS: 10.0000 mg | ORAL_TABLET | Freq: Three times a day (TID) | ORAL | 0 refills | Status: DC | PRN

## 2017-04-23 NOTE — Progress Notes (Addendum)
While in CXR- CXR tech called out for assistance. While standing for her chest xray, Hannah Schultz began to vomit and then her legs gave out. She was assisted to the floor by the [facility] tech. Pt denies any injuries. Assisted to a wheelchair by her husband and Garment/textile technologist. CBG 201 Bp 145/79 heart rate 72. Skin cool and moist to touch. Skin color pale. Report called to the ED spoke with Janett Billow. Pt taken taken down to ED via wheel chair her husband at her side.

## 2017-04-23 NOTE — Progress Notes (Addendum)
PCP is Dr. Sinda Du Endocrinologist is  Dr.Balan Denies ever seeing a cardiologist. Denies ever having a card cath or echo, states she may have had a stress test, but if she did it was many years ago. States she doesn't wear a CPAP. Reports her fasitng CBG's run 125-140. Reports she has been having chest pain states it comes and goes for about 6 months. None today. States that is part of the reason they are doing surgery. Reports she has lost a lot of weight and has been weak.   Hannah Schultz states that her insulin regime was changed about 2 months ago, reviewed with her what is on our med rec and she states that it is correct. Call placed to Dr Almetta Lovely office to review her insulin regime.

## 2017-04-23 NOTE — ED Triage Notes (Signed)
Per Pt, Pt is coming from radiology where she had a near-syncopal episode. Pt reports hse has had worsening weakness since March. Today, while she was trying to stand for a chest Xray, pt was not able to stay standing and was guided to the ground by the technician. Pt reports feeling lightheaded "like I was about to pass out." Pt was receiving Xray for pre-op concerning a biopsy of her back and chest in one week.

## 2017-04-23 NOTE — ED Provider Notes (Signed)
[facility] DEPT Provider Note   CSN: [id number] Arrival date & time: 04/23/17  1609     History   Chief Complaint Chief Complaint  Patient presents with  . Fall  . Weakness    HPI Hannah Schultz is a 56 y.o. female.  Patient reports over the last couple months, she's had intermittent episodes of nausea, vomiting, especially after eating. She is being followed outpatient. Has been found to have multiple large lymph nodes including hilar lymphadenopathy and intra-abdominal lymphadenopathy. She is due to have a biopsy of one of her lymph nodes with cardiothoracic surgery in 2 days. Today she had a chest x-ray for preoperative management. Patient went from position to sitting to standing, felt lightheaded, nauseated, felt to the ground, was noted to be pale. Did not lose consciousness. Denies any chest pain or shortness of breath. Denies headache or vision changes. Denies palpitations.   The history is provided by the patient and medical records.  Illness  This is a new problem. Episode onset: several months. The problem occurs constantly. The problem has not changed since onset.Associated symptoms include abdominal pain. Pertinent negatives include no chest pain, no headaches and no shortness of breath. Nothing relieves the symptoms.    Past Medical History:  Diagnosis Date  . Acid reflux   . Arthritis   . Bulging lumbar disc   . Depression   . Diabetes mellitus   . Diabetic neuropathy ([place])   . High cholesterol   . Hypertension   . Post traumatic stress disorder (PTSD)   . Sleep apnea     Patient Active Problem List   Diagnosis Date Noted  . Other and unspecified hyperlipidemia 09/19/2013  . Unspecified essential hypertension 09/19/2013  . Type II or unspecified type diabetes mellitus without mention of complication, uncontrolled 07/20/2013  . Trigger thumb of right hand 11/27/2012  . CTS (carpal tunnel syndrome) 11/27/2012    Past Surgical History:  Procedure  Laterality Date  . ABDOMINAL HYSTERECTOMY    . CHOLECYSTECTOMY    . COLONOSCOPY    . TONSILLECTOMY  04/02/2012   Procedure: TONSILLECTOMY;  Surgeon: Ascencion Dike, MD;  Location: [facility] OR;  Service: ENT;  Laterality: Bilateral;    OB History    Gravida Para Term Preterm AB Living             2   SAB TAB Ectopic Multiple Live Births                   Home Medications    Prior to Admission medications   Medication Sig Start Date End Date Taking? Authorizing Provider  Cholecalciferol (VITAMIN D3) 5000 UNITS TABS Take 5,000 Units by mouth daily.    Yes [provider]  FLUoxetine (PROZAC) 20 MG tablet Take 20 mg by mouth daily.   Yes [provider]  gabapentin (NEURONTIN) 300 MG capsule Take 600 mg by mouth 3 (three) times daily.    Yes [provider]  glucose blood (ONE TOUCH ULTRA TEST) test strip 3 times per day 12/29/13  Yes Elayne Snare, MD  insulin degludec (TRESIBA FLEXTOUCH) 100 UNIT/ML SOPN FlexTouch Pen Inject 40-80 Units into the skin daily before breakfast.   Yes [provider]  Insulin Pen Needle (BD PEN NEEDLE NANO U/F) 32G X 4 MM MISC 1 each by Does not apply route 4 (four) times daily. 08/12/13  Yes Elayne Snare, MD  nebivolol (BYSTOLIC) 5 MG tablet Take 5 mg by mouth daily.  Yes [provider]  NOVOLIN N RELION 100 UNIT/ML injection Inject 40 Units into the skin 3 (three) times daily before meals. PER SLIDING SCALE 12/13/16  Yes [provider]  ondansetron (ZOFRAN) 4 MG tablet Take 4 mg by mouth every 4 (four) hours as needed for nausea or vomiting.   Yes [provider]  promethazine (PHENERGAN) 25 MG tablet Take 25 mg by mouth every 6 (six) hours as needed for nausea or vomiting.   Yes [provider]  ranitidine (ZANTAC) 75 MG tablet Take 75 mg by mouth at bedtime.   Yes [provider]  simvastatin (ZOCOR) 40 MG tablet Take 1 tablet (40 mg total) by mouth every evening. Patient taking  differently: Take 40 mg by mouth at bedtime.  09/22/13  Yes Elayne Snare, MD  sitaGLIPtin (JANUVIA) 100 MG tablet Take 100 mg by mouth every morning.    Yes [provider]  metoCLOPramide (REGLAN) 10 MG tablet Take 1 tablet (10 mg total) by mouth every 8 (eight) hours as needed for nausea. 04/23/17   Maryan Puls, MD    Family History Family History  Problem Relation Age of Onset  . Heart disease Unknown   . Cancer Unknown   . Diabetes Unknown     Social History Social History  Substance Use Topics  . Smoking status: Never Smoker  . Smokeless tobacco: Never Used  . Alcohol use No     Allergies   Adhesive [tape]   Review of Systems Review of Systems  Constitutional: Positive for fatigue. Negative for chills and fever.  Respiratory: Negative for cough and shortness of breath.   Cardiovascular: Negative for chest pain and palpitations.  Gastrointestinal: Positive for abdominal pain, nausea and vomiting. Negative for blood in stool and diarrhea.  Genitourinary: Negative for dysuria, frequency, vaginal bleeding and vaginal discharge.  Neurological: Positive for light-headedness. Negative for headaches.  All other systems reviewed and are negative.    Physical Exam Updated Vital Signs BP 130/72   Pulse 76   Temp 98 F (36.7 C) (Oral)   Resp (!) 22   Ht $R'5\' 5"'OF$  (1.651 m)   Wt 104.3 kg   SpO2 98%   BMI 38.27 kg/m   Physical Exam  Constitutional: She appears well-developed and well-nourished. No distress.  HENT:  Head: Normocephalic and atraumatic.  Mouth/Throat: Mucous membranes are dry.  Eyes: Conjunctivae are normal.  Neck: Neck supple.  Cardiovascular: Normal rate and regular rhythm.   No murmur heard. Pulmonary/Chest: Effort normal and breath sounds normal. No respiratory distress.  Abdominal: Soft. There is generalized tenderness. There is no rebound, no guarding, no tenderness at McBurney's point and negative Murphy's sign.  Musculoskeletal: She  exhibits no edema.  Neurological: She is alert.  Skin: Skin is warm and dry.  Psychiatric: She has a normal mood and affect.  Nursing note and vitals reviewed.    ED Treatments / Results  Labs (all labs ordered are listed, but only abnormal results are displayed) Labs Reviewed  URINALYSIS, ROUTINE W REFLEX MICROSCOPIC - Abnormal; Notable for the following:       Result Value   Color, Urine AMBER (*)    APPearance CLOUDY (*)    Protein, ur 100 (*)    Leukocytes, UA SMALL (*)    Bacteria, UA FEW (*)    Squamous Epithelial / LPF 6-30 (*)    All other components within normal limits  CBG MONITORING, ED - Abnormal; Notable for the following:    Glucose-Capillary  213 (*)    All other components within normal limits    EKG  EKG Interpretation  Date/Time:  Tuesday Apr 23 2017 17:06:58 EDT Ventricular Rate:  64 PR Interval:  140 QRS Duration: 72 QT Interval:  416 QTC Calculation: 429 R Axis:   15 Text Interpretation:  Normal sinus rhythm Normal ECG Normal sinus rhythm No significant change since last tracing Confirmed by Zenovia Jarred [phone]) on 04/23/2017 9:11:49 PM       Radiology Dg Chest 1 View  Result Date: 04/23/2017 CLINICAL DATA:  Mediastinal adenopathy.  Preoperative chest x-ray . EXAM: CHEST 1 VIEW COMPARISON:  PET-CT 04/04/2017. CT 01/29/2017. Chest x-ray 01/29/2017 FINDINGS: Mediastinum and hilar fullness is again noted consistent with known adenopathy. Heart size stable. Lungs are clear. No pleural effusion or pneumothorax. IMPRESSION: Mediastinum hilar fullness is again noted consist with known adenopathy. No acute cardiopulmonary disease. Electronically Signed   By: Marcello Moores  Register   On: 04/23/2017 16:56    Procedures Procedures (including critical care time)  Medications Ordered in ED Medications  sodium chloride 0.9 % bolus 1,000 mL (0 mLs Intravenous Stopped 04/23/17 2310)  metoCLOPramide (REGLAN) injection 10 mg (10 mg Intravenous Given 04/23/17 2145)      Initial Impression / Assessment and Plan / ED Course  I have reviewed the triage vital signs and the nursing notes.  Pertinent labs & imaging results that were available during my care of the patient were reviewed by me and considered in my medical decision making (see chart for details).     Feel most likely diagnosis for the patient is orthostatic hypotension 2/2 being volume down from n/v and decreased PO intake. The fact that the patient went from positioning of sitting to standing and had this occur is indicative of this. Give the patient some IV fluids here with improvement in symptoms. Already has a prescription of Zofran at home. Gave her Reglan here, which the patient reports he works better than the Zofran she had used before. We'll give her a prescription for reglan. Exam here reassuring. No evidence of peritoneal signs. Doubt acute abdomen. She had a CAT scan of her abdomen less than 2 weeks ago that demonstrates the known lymphadenopathy, no other acute intra-abdominal pathology. She reports no new change in her abdominal pain. We'll not pursue re-imaging. Labs here are reassuring. Leukopenia here, consistent with prior blood draws. Hemoglobin consistent with her baseline. electrolites within normal limits. No evidence of acidosis. Creatinine is consistent with the one from 12 days ago. Urinalysis without evidence of urinary tract infection.  Patient tolerating oral intake. Patient ambulatory throughout the emergency department prior to discharge. Strongly encouraged her to follow up with primary care doctor as well as the physician who will be performing the biopsy. She reports that she lives with her husband who will be able to keep an eye on her. Told them to return to the emergency department should there be any concerns or worsening of symptoms.  Final Clinical Impressions(s) / ED Diagnoses   Final diagnoses:  Near syncope  Dehydration    New Prescriptions New  Prescriptions   METOCLOPRAMIDE (REGLAN) 10 MG TABLET    Take 1 tablet (10 mg total) by mouth every 8 (eight) hours as needed for nausea.     Maryan Puls, MD 04/23/17 2319    Macarthur Critchley, MD 04/24/17 4166

## 2017-04-23 NOTE — Progress Notes (Signed)
Moronda from Dr Christeen Douglas office called and stated that patient should be taking 20- 30- units of Novolin N Relion 3 times a day and 70 units of [place]. I asked Zane Herald if the office would be following up with patient since we now that patient is not taking as ordered, she said that they would.

## 2017-04-23 NOTE — Progress Notes (Signed)
Pt not here for PAT appointment called her  states she is on her way.

## 2017-04-23 NOTE — Pre-Procedure Instructions (Addendum)
Hannah Schultz  04/23/2017      [facility] [place] - [id number] [place] [address] Phone: [phone] Fax: [phone]  [facility] - [place], [place] [address] Phone: [phone] Fax: [phone]    Your procedure is scheduled on May 17  Report to [facility] at [employer].M.  Call this number if you have problems the morning of surgery:  [phone]   Remember:  Do not eat food or drink liquids after midnight.  Take these medicines the morning of surgery with A SIP OF WATER Fluoxetine (Prozac), Gabapentin (Neurontin), nebivolol (Bystolic), Zofran or phenergan if needed  Stop taking aspirin, BC's, Goody's, herbal medications, Fish Oil, Ibuprofen, Advil, Motrin, Aleve, Vitamins    How to Manage Your Diabetes Before and After Surgery  Why is it important to control my blood sugar before and after surgery? . Improving blood sugar levels before and after surgery helps healing and can limit problems. . A way of improving blood sugar control is eating a healthy diet by: o  Eating less sugar and carbohydrates o  Increasing activity/exercise o  Talking with your doctor about reaching your blood sugar goals . High blood sugars (greater than 180 mg/dL) can raise your risk of infections and slow your recovery, so you will need to focus on controlling your diabetes during the weeks before surgery. . Make sure that the doctor who takes care of your diabetes knows about your planned surgery including the date and location.  How do I manage my blood sugar before surgery? . Check your blood sugar at least 4 times a day, starting 2 days before surgery, to make sure that the level is not too high or low. o Check your blood sugar the morning of your surgery when you wake up and every 2 hours until you get to the Short Stay unit. . If your blood sugar  is less than 70 mg/dL, you will need to treat for low blood sugar: o Do not take insulin. o Treat a low blood sugar (less than 70 mg/dL) with  cup of clear juice (cranberry or apple), 4 glucose tablets, OR glucose gel. o Recheck blood sugar in 15 minutes after treatment (to make sure it is greater than 70 mg/dL). If your blood sugar is not greater than 70 mg/dL on recheck, call [phone] for further instructions. . Report your blood sugar to the short stay nurse when you get to Short Stay.  . If you are admitted to the hospital after surgery: o Your blood sugar will be checked by the staff and you will probably be given insulin after surgery (instead of oral diabetes medicines) to make sure you have good blood sugar levels. o The goal for blood sugar control after surgery is 80-180 mg/dL        WHAT DO I DO ABOUT MY DIABETES MEDICATION?   Marland Kitchen Do not take oral diabetes medicines (pills) the morning of surgery. Januvia       . HE MORNING OF SURGERY, take  Half of your normal dose of Tresiba insulin.  . The day of surgery, do not take other diabetes injectables, including Byetta (exenatide), Bydureon (exenatide ER), Victoza (liraglutide), or Trulicity (dulaglutide).  . If your CBG is greater than 220 mg/dL, you may take  of your sliding scale (correction) dose of insulin.  Other Instructions:  Patient Signature:  Date:   Nurse Signature:  Date:   Reviewed and Endorsed by [facility] Patient Education Committee, August 2015  Do not wear jewelry, make-up or nail polish.  Do not wear lotions, powders, or perfumes, or deoderant.  Do not shave 48 hours prior to surgery.  Men may shave face and neck.  Do not bring valuables to the hospital.  [facility] is not responsible for any belongings or valuables.  Contacts, dentures or bridgework may not be worn into surgery.  Leave your suitcase in the car.  After surgery it may be brought to your room.  For patients admitted  to the hospital, discharge time will be determined by your treatment team.  Patients discharged the day of surgery will not be allowed to drive home.    Special instructions:  [place] - Preparing for Surgery  Before surgery, you can play an important role.  Because skin is not sterile, your skin needs to be as free of germs as possible.  You can reduce the number of germs on you skin by washing with CHG (chlorahexidine gluconate) soap before surgery.  CHG is an antiseptic cleaner which kills germs and bonds with the skin to continue killing germs even after washing.  Please DO NOT use if you have an allergy to CHG or antibacterial soaps.  If your skin becomes reddened/irritated stop using the CHG and inform your nurse when you arrive at Short Stay.  Do not shave (including legs and underarms) for at least 48 hours prior to the first CHG shower.  You may shave your face.  Please follow these instructions carefully:   1.  Shower with CHG Soap the night before surgery and the   morning of Surgery.  2.  If you choose to wash your hair, wash your hair first as usual with your  normal shampoo.  3.  After you shampoo, rinse your hair and body thoroughly to remove the Shampoo.  4.  Use CHG as you would any other liquid soap.  You can apply chg directly to the skin and wash gently with scrungie or a clean washcloth.  5.  Apply the CHG Soap to your body ONLY FROM THE NECK DOWN.  Do not use on open wounds or open sores.  Avoid contact with your eyes,  ears, mouth and genitals (private parts).  Wash genitals (private parts)  with your normal soap.  6.  Wash thoroughly, paying special attention to the area where your surgery   will be performed.  7.  Thoroughly rinse your body with warm water from the neck down.  8.  DO NOT shower/wash with your normal soap after using and rinsing off the CHG Soap.  9.  Pat yourself dry with a clean towel.            10.  Wear clean pajamas.            11.  Place  clean sheets on your bed the night of your first shower and do not  sleep with pets.  Day of Surgery  Do not apply any lotions/deoderants the morning of surgery.  Please wear clean clothes to the hospital/surgery center.     Please read over the following fact sheets that you were given. Pain Booklet, Coughing and Deep Breathing, MRSA Information and Surgical Site Infection Prevention

## 2017-04-24 LAB — HEMOGLOBIN A1C
Hgb A1c MFr Bld: 7.1 % — ABNORMAL HIGH (ref 4.8–5.6)
MEAN PLASMA GLUCOSE: 157 mg/dL

## 2017-04-24 NOTE — Progress Notes (Signed)
Anesthesia Chart Review:  Pt is a 56 year old female scheduled for mediastinoscopy on 04/25/2017 with Modesto Charon, M.D.  - PCP is Sinda Du, MD  PMH includes: HTN, DM, hyperlipidemia, OSA, PTSD, GERD. Never smoker. BMI 38. S/p tonsillectomy 04/02/12.   - After getting CXR in PAT, pt vomited and felt weak, was assisted to the floor.  Transferred to ED for evaluation, given IV fluids and reglan, sx resolved.  Felt to be orthostatic hypotension due to dehydration from vomiting and decreased PO intake.   - ED visit 04/11/17 for abdominal pain. CT scan showed no acute problem.   - ED visit 01/29/17 for chest pain; CT scan identified extensive adenopathy suspicious for lymphoma.   Medications include: Tresiba, Reglan, nebivolol, Novolin N, Phenergan, Zantac, simvastatin, sitagliptin  BP 126/79   Pulse 71   Temp 36.9 C   Resp 18   Ht $R'5\' 5"'Pv$  (1.651 m)   Wt 231 lb 4.8 oz (104.9 kg)   SpO2 98%   BMI 38.49 kg/m    Preoperative labs reviewed.  - HbA1c 7.1, glucose 224.  - PT normal, PTT 37  1 view CXR 04/23/17: Mediastinum hilar fullness is again noted consist with known adenopathy. No acute cardiopulmonary disease.  CT chest 01/11/17:  1. Enlarged mediastinal and bilateral hilar nodes, measuring up to 2.2 cm. Enlarged nodes at the upper abdomen, about the celiac trunk. This could reflect lymphoma, sarcoidosis or a variety of inflammatory conditions. The appearance is less typical for metastatic disease. A number of the mediastinal nodes would be amenable to biopsy, as deemed clinically appropriate. 2. Minimal bibasilar atelectasis noted.  Lungs otherwise clear.  EKG 04/23/17: NSR  If no changes, I anticipate pt can proceed with surgery as scheduled.   Willeen Cass, FNP-BC [facility] Short Stay Surgical Center/Anesthesiology Phone: [phone] 04/24/2017 10:54 AM

## 2017-04-24 NOTE — Progress Notes (Addendum)
Patient called about a grey cane that was left in the preadmission area when she came for her pre-op appointment yesterday.  The cane is located behind the Network engineer desk in PAT

## 2017-04-25 ENCOUNTER — Encounter (HOSPITAL_COMMUNITY): Payer: Self-pay | Admitting: Emergency Medicine

## 2017-04-25 ENCOUNTER — Emergency Department (HOSPITAL_COMMUNITY)
Admission: EM | Admit: 2017-04-25 | Discharge: 2017-04-25 | Disposition: A | Discharge status: Home / Self Care | Payer: PPO | Source: Home / Self Care | Attending: Emergency Medicine | Admitting: Emergency Medicine

## 2017-04-25 ENCOUNTER — Encounter (HOSPITAL_COMMUNITY): Payer: Self-pay | Admitting: General Practice

## 2017-04-25 ENCOUNTER — Ambulatory Visit (HOSPITAL_COMMUNITY): Payer: PPO | Admitting: Vascular Surgery

## 2017-04-25 ENCOUNTER — Encounter (HOSPITAL_COMMUNITY)
Admission: RE | Disposition: A | Discharge status: Home / Self Care | Payer: Self-pay | Source: Ambulatory Visit | Attending: Thoracic Surgery (Cardiothoracic Vascular Surgery)

## 2017-04-25 ENCOUNTER — Emergency Department (HOSPITAL_COMMUNITY): Payer: PPO

## 2017-04-25 ENCOUNTER — Ambulatory Visit (HOSPITAL_COMMUNITY)
Admission: RE | Admit: 2017-04-25 | Discharge: 2017-04-25 | Disposition: A | Discharge status: Home / Self Care | Payer: PPO | Source: Ambulatory Visit | Attending: Thoracic Surgery (Cardiothoracic Vascular Surgery) | Admitting: Thoracic Surgery (Cardiothoracic Vascular Surgery)

## 2017-04-25 ENCOUNTER — Ambulatory Visit (HOSPITAL_COMMUNITY): Payer: PPO | Admitting: Anesthesiology

## 2017-04-25 DIAGNOSIS — G4730 Sleep apnea, unspecified: Secondary | ICD-10-CM | POA: Diagnosis not present

## 2017-04-25 DIAGNOSIS — R630 Anorexia: Secondary | ICD-10-CM | POA: Insufficient documentation

## 2017-04-25 DIAGNOSIS — E1140 Type 2 diabetes mellitus with diabetic neuropathy, unspecified: Secondary | ICD-10-CM | POA: Insufficient documentation

## 2017-04-25 DIAGNOSIS — E7800 Pure hypercholesterolemia, unspecified: Secondary | ICD-10-CM | POA: Insufficient documentation

## 2017-04-25 DIAGNOSIS — I889 Nonspecific lymphadenitis, unspecified: Secondary | ICD-10-CM | POA: Diagnosis not present

## 2017-04-25 DIAGNOSIS — R05 Cough: Secondary | ICD-10-CM | POA: Insufficient documentation

## 2017-04-25 DIAGNOSIS — K219 Gastro-esophageal reflux disease without esophagitis: Secondary | ICD-10-CM | POA: Diagnosis not present

## 2017-04-25 DIAGNOSIS — R590 Localized enlarged lymph nodes: Secondary | ICD-10-CM | POA: Diagnosis not present

## 2017-04-25 DIAGNOSIS — Z79899 Other long term (current) drug therapy: Secondary | ICD-10-CM | POA: Insufficient documentation

## 2017-04-25 DIAGNOSIS — I10 Essential (primary) hypertension: Secondary | ICD-10-CM

## 2017-04-25 DIAGNOSIS — Z6838 Body mass index (BMI) 38.0-38.9, adult: Secondary | ICD-10-CM | POA: Diagnosis not present

## 2017-04-25 DIAGNOSIS — E1142 Type 2 diabetes mellitus with diabetic polyneuropathy: Secondary | ICD-10-CM | POA: Insufficient documentation

## 2017-04-25 DIAGNOSIS — F329 Major depressive disorder, single episode, unspecified: Secondary | ICD-10-CM | POA: Insufficient documentation

## 2017-04-25 DIAGNOSIS — I888 Other nonspecific lymphadenitis: Secondary | ICD-10-CM | POA: Diagnosis not present

## 2017-04-25 DIAGNOSIS — Z794 Long term (current) use of insulin: Secondary | ICD-10-CM | POA: Insufficient documentation

## 2017-04-25 DIAGNOSIS — R634 Abnormal weight loss: Secondary | ICD-10-CM | POA: Insufficient documentation

## 2017-04-25 DIAGNOSIS — R059 Cough, unspecified: Secondary | ICD-10-CM

## 2017-04-25 DIAGNOSIS — E119 Type 2 diabetes mellitus without complications: Secondary | ICD-10-CM | POA: Diagnosis not present

## 2017-04-25 HISTORY — PX: MEDIASTINOSCOPY: SHX5086

## 2017-04-25 LAB — GLUCOSE, CAPILLARY: GLUCOSE-CAPILLARY: 191 mg/dL — AB (ref 65–99)

## 2017-04-25 SURGERY — MEDIASTINOSCOPY
Anesthesia: General | Site: Neck

## 2017-04-25 MED ORDER — DEXAMETHASONE SODIUM PHOSPHATE 10 MG/ML IJ SOLN
INTRAMUSCULAR | Status: DC | PRN
  Administered 2017-04-25: 5 mg via INTRAVENOUS

## 2017-04-25 MED ORDER — ROCURONIUM BROMIDE 100 MG/10ML IV SOLN
INTRAVENOUS | Status: DC | PRN
  Administered 2017-04-25: 40 mg via INTRAVENOUS

## 2017-04-25 MED ORDER — LACTATED RINGERS IV SOLN
INTRAVENOUS | Status: DC | PRN
  Administered 2017-04-25: 08:00:00 via INTRAVENOUS

## 2017-04-25 MED ORDER — FENTANYL CITRATE (PF) 100 MCG/2ML IJ SOLN
INTRAMUSCULAR | Status: DC | PRN
  Administered 2017-04-25: 100 ug via INTRAVENOUS
  Administered 2017-04-25 (×2): 50 ug via INTRAVENOUS

## 2017-04-25 MED ORDER — PHENYLEPHRINE 40 MCG/ML (10ML) SYRINGE FOR IV PUSH (FOR BLOOD PRESSURE SUPPORT)
PREFILLED_SYRINGE | INTRAVENOUS | Status: AC
  Filled 2017-04-25: qty 10

## 2017-04-25 MED ORDER — SODIUM CHLORIDE 0.9 % IV SOLN
INTRAVENOUS | Status: DC | PRN
  Administered 2017-04-25: 50 ug/min via INTRAVENOUS

## 2017-04-25 MED ORDER — MIDAZOLAM HCL 2 MG/2ML IJ SOLN
INTRAMUSCULAR | Status: AC
  Filled 2017-04-25: qty 2

## 2017-04-25 MED ORDER — PROPOFOL 10 MG/ML IV BOLUS
INTRAVENOUS | Status: AC
  Filled 2017-04-25: qty 20

## 2017-04-25 MED ORDER — HYDROCOD POLST-CPM POLST ER 10-8 MG/5ML PO SUER
5.0000 mL | Freq: Once | ORAL | Status: AC
  Administered 2017-04-25: 5 mL via ORAL
  Filled 2017-04-25: qty 5

## 2017-04-25 MED ORDER — SUCCINYLCHOLINE CHLORIDE 20 MG/ML IJ SOLN
INTRAMUSCULAR | Status: DC | PRN
  Administered 2017-04-25: 80 mg via INTRAVENOUS

## 2017-04-25 MED ORDER — OXYCODONE HCL 5 MG PO TABS: 5.0000 mg | ORAL_TABLET | Freq: Four times a day (QID) | ORAL | 0 refills | Status: DC | PRN

## 2017-04-25 MED ORDER — PROPOFOL 10 MG/ML IV BOLUS
INTRAVENOUS | Status: DC | PRN
  Administered 2017-04-25: 130 mg via INTRAVENOUS

## 2017-04-25 MED ORDER — OXYCODONE HCL 5 MG PO TABS: 5.0000 mg | ORAL_TABLET | Freq: Once | ORAL | Status: DC | PRN

## 2017-04-25 MED ORDER — FENTANYL CITRATE (PF) 100 MCG/2ML IJ SOLN
INTRAMUSCULAR | Status: AC
  Filled 2017-04-25: qty 2

## 2017-04-25 MED ORDER — MIDAZOLAM HCL 2 MG/2ML IJ SOLN
INTRAMUSCULAR | Status: DC | PRN
  Administered 2017-04-25: 2 mg via INTRAVENOUS

## 2017-04-25 MED ORDER — NEBIVOLOL HCL 5 MG PO TABS
5.0000 mg | ORAL_TABLET | Freq: Once | ORAL | Status: AC
  Administered 2017-04-25: 5 mg via ORAL
  Filled 2017-04-25: qty 1

## 2017-04-25 MED ORDER — SUGAMMADEX SODIUM 200 MG/2ML IV SOLN
INTRAVENOUS | Status: AC
  Filled 2017-04-25: qty 2

## 2017-04-25 MED ORDER — SUGAMMADEX SODIUM 500 MG/5ML IV SOLN
INTRAVENOUS | Status: DC | PRN
  Administered 2017-04-25: 350 mg via INTRAVENOUS

## 2017-04-25 MED ORDER — SUGAMMADEX SODIUM 500 MG/5ML IV SOLN
INTRAVENOUS | Status: AC
  Filled 2017-04-25: qty 5

## 2017-04-25 MED ORDER — FENTANYL CITRATE (PF) 250 MCG/5ML IJ SOLN
INTRAMUSCULAR | Status: AC
  Filled 2017-04-25: qty 5

## 2017-04-25 MED ORDER — ROCURONIUM BROMIDE 10 MG/ML (PF) SYRINGE
PREFILLED_SYRINGE | INTRAVENOUS | Status: AC
  Filled 2017-04-25: qty 5

## 2017-04-25 MED ORDER — PHENYLEPHRINE HCL 10 MG/ML IJ SOLN
INTRAMUSCULAR | Status: DC | PRN
  Administered 2017-04-25: 80 ug via INTRAVENOUS
  Administered 2017-04-25: 120 ug via INTRAVENOUS
  Administered 2017-04-25: 80 ug via INTRAVENOUS

## 2017-04-25 MED ORDER — EPHEDRINE 5 MG/ML INJ
INTRAVENOUS | Status: AC
  Filled 2017-04-25: qty 10

## 2017-04-25 MED ORDER — FENTANYL CITRATE (PF) 100 MCG/2ML IJ SOLN
25.0000 ug | INTRAMUSCULAR | Status: DC | PRN
  Administered 2017-04-25: 50 ug via INTRAVENOUS

## 2017-04-25 MED ORDER — DEXTROSE 5 % IV SOLN
1.5000 g | INTRAVENOUS | Status: AC
  Administered 2017-04-25: 1.5 g via INTRAVENOUS
  Filled 2017-04-25: qty 1.5

## 2017-04-25 MED ORDER — ONDANSETRON HCL 4 MG/2ML IJ SOLN
INTRAMUSCULAR | Status: DC | PRN
  Administered 2017-04-25: 4 mg via INTRAVENOUS

## 2017-04-25 MED ORDER — OXYCODONE HCL 5 MG PO TABS: 5.0000 mg | ORAL_TABLET | Freq: Four times a day (QID) | ORAL | Status: DC | PRN

## 2017-04-25 MED ORDER — 0.9 % SODIUM CHLORIDE (POUR BTL) OPTIME
TOPICAL | Status: DC | PRN
  Administered 2017-04-25: 1000 mL

## 2017-04-25 MED ORDER — SCOPOLAMINE 1 MG/3DAYS TD PT72
MEDICATED_PATCH | TRANSDERMAL | Status: DC | PRN
  Administered 2017-04-25: 1 via TRANSDERMAL

## 2017-04-25 MED ORDER — SUCCINYLCHOLINE CHLORIDE 200 MG/10ML IV SOSY
PREFILLED_SYRINGE | INTRAVENOUS | Status: AC
  Filled 2017-04-25: qty 10

## 2017-04-25 MED ORDER — ONDANSETRON HCL 4 MG/2ML IJ SOLN
INTRAMUSCULAR | Status: AC
  Filled 2017-04-25: qty 2

## 2017-04-25 MED ORDER — LIDOCAINE 2% (20 MG/ML) 5 ML SYRINGE
INTRAMUSCULAR | Status: AC
  Filled 2017-04-25: qty 5

## 2017-04-25 MED ORDER — PROMETHAZINE HCL 25 MG PO TABS
12.5000 mg | ORAL_TABLET | Freq: Once | ORAL | Status: AC
  Administered 2017-04-25: 12.5 mg via ORAL
  Filled 2017-04-25: qty 1

## 2017-04-25 MED ORDER — HYDROCODONE-HOMATROPINE 5-1.5 MG/5ML PO SYRP: 5.0000 mL | ORAL_SOLUTION | Freq: Four times a day (QID) | ORAL | 0 refills | Status: DC | PRN

## 2017-04-25 MED ORDER — SCOPOLAMINE 1 MG/3DAYS TD PT72
MEDICATED_PATCH | TRANSDERMAL | Status: AC
  Filled 2017-04-25: qty 1

## 2017-04-25 MED ORDER — LIDOCAINE HCL (CARDIAC) 20 MG/ML IV SOLN
INTRAVENOUS | Status: DC | PRN
  Administered 2017-04-25: 80 mg via INTRAVENOUS

## 2017-04-25 MED ORDER — OXYCODONE HCL 5 MG/5ML PO SOLN: 5.0000 mg | Freq: Once | ORAL | Status: DC | PRN

## 2017-04-25 MED ORDER — DEXAMETHASONE SODIUM PHOSPHATE 10 MG/ML IJ SOLN
INTRAMUSCULAR | Status: AC
  Filled 2017-04-25: qty 1

## 2017-04-25 MED ORDER — EPHEDRINE SULFATE 50 MG/ML IJ SOLN
INTRAMUSCULAR | Status: DC | PRN
  Administered 2017-04-25: 5 mg via INTRAVENOUS

## 2017-04-25 SURGICAL SUPPLY — 51 items
ADH SKN CLS APL DERMABOND .7 (GAUZE/BANDAGES/DRESSINGS) ×1
APPLIER CLIP LOGIC TI 5 (MISCELLANEOUS) IMPLANT
APR CLP MED LRG 33X5 (MISCELLANEOUS)
BLADE SURG 15 STRL LF DISP TIS (BLADE) ×1 IMPLANT
BLADE SURG 15 STRL SS (BLADE) ×2
CANISTER SUCT 3000ML PPV (MISCELLANEOUS) ×2 IMPLANT
CLIP TI MEDIUM 6 (CLIP) IMPLANT
CONT SPEC 4OZ CLIKSEAL STRL BL (MISCELLANEOUS) ×4 IMPLANT
COVER SURGICAL LIGHT HANDLE (MISCELLANEOUS) ×4 IMPLANT
DERMABOND ADVANCED (GAUZE/BANDAGES/DRESSINGS) ×1
DERMABOND ADVANCED .7 DNX12 (GAUZE/BANDAGES/DRESSINGS) ×1 IMPLANT
DRAPE CHEST BREAST 15X10 FENES (DRAPES) ×2 IMPLANT
ELECT REM PT RETURN 9FT ADLT (ELECTROSURGICAL) ×2
ELECTRODE REM PT RTRN 9FT ADLT (ELECTROSURGICAL) ×1 IMPLANT
GAUZE SPONGE 4X4 12PLY STRL (GAUZE/BANDAGES/DRESSINGS) ×2 IMPLANT
GAUZE SPONGE 4X4 16PLY XRAY LF (GAUZE/BANDAGES/DRESSINGS) ×2 IMPLANT
GLOVE BIOGEL PI IND STRL 6.5 (GLOVE) IMPLANT
GLOVE BIOGEL PI IND STRL 7.0 (GLOVE) IMPLANT
GLOVE BIOGEL PI INDICATOR 6.5 (GLOVE) ×3
GLOVE BIOGEL PI INDICATOR 7.0 (GLOVE) ×1
GLOVE ECLIPSE 6.5 STRL STRAW (GLOVE) ×2 IMPLANT
GLOVE SURG SIGNA 7.5 PF LTX (GLOVE) ×2 IMPLANT
GLOVE SURG SS PI 7.0 STRL IVOR (GLOVE) ×1 IMPLANT
GOWN STRL REUS W/ TWL LRG LVL3 (GOWN DISPOSABLE) ×1 IMPLANT
GOWN STRL REUS W/ TWL XL LVL3 (GOWN DISPOSABLE) ×1 IMPLANT
GOWN STRL REUS W/TWL LRG LVL3 (GOWN DISPOSABLE) ×2
GOWN STRL REUS W/TWL XL LVL3 (GOWN DISPOSABLE) ×2
HEMOSTAT SURGICEL 2X14 (HEMOSTASIS) IMPLANT
KIT BASIN OR (CUSTOM PROCEDURE TRAY) ×2 IMPLANT
KIT ROOM TURNOVER OR (KITS) ×2 IMPLANT
NS IRRIG 1000ML POUR BTL (IV SOLUTION) ×2 IMPLANT
PACK SURGICAL SETUP 50X90 (CUSTOM PROCEDURE TRAY) ×2 IMPLANT
PAD ARMBOARD 7.5X6 YLW CONV (MISCELLANEOUS) ×4 IMPLANT
PENCIL BUTTON HOLSTER BLD 10FT (ELECTRODE) ×2 IMPLANT
SPONGE INTESTINAL PEANUT (DISPOSABLE) IMPLANT
SUT SILK 2 0 (SUTURE)
SUT SILK 2-0 18XBRD TIE 12 (SUTURE) IMPLANT
SUT VIC AB 2-0 CT1 27 (SUTURE)
SUT VIC AB 2-0 CT1 TAPERPNT 27 (SUTURE) IMPLANT
SUT VIC AB 3-0 SH 18 (SUTURE) ×2 IMPLANT
SUT VICRYL 4-0 PS2 18IN ABS (SUTURE) ×2 IMPLANT
SWAB COLLECTION DEVICE MRSA (MISCELLANEOUS) IMPLANT
SWAB CULTURE ESWAB REG 1ML (MISCELLANEOUS) IMPLANT
SYR 10ML LL (SYRINGE) ×2 IMPLANT
SYR BULB 3OZ (MISCELLANEOUS) ×2 IMPLANT
TOWEL GREEN STERILE (TOWEL DISPOSABLE) ×4 IMPLANT
TOWEL GREEN STERILE FF (TOWEL DISPOSABLE) ×4 IMPLANT
TOWEL OR 17X24 6PK STRL BLUE (TOWEL DISPOSABLE) ×2 IMPLANT
TOWEL OR 17X26 10 PK STRL BLUE (TOWEL DISPOSABLE) ×2 IMPLANT
TUBE CONNECTING 12X1/4 (SUCTIONS) ×2 IMPLANT
WATER STERILE IRR 1000ML POUR (IV SOLUTION) ×2 IMPLANT

## 2017-04-25 NOTE — ED Notes (Signed)
Pt able to swallow fluids well

## 2017-04-25 NOTE — Anesthesia Preprocedure Evaluation (Signed)
Anesthesia Evaluation  Patient identified by MRN, date of birth, ID band Patient awake    Reviewed: Allergy & Precautions, NPO status , Patient's Chart, lab work & pertinent test results  History of Anesthesia Complications Negative for: history of anesthetic complications  Airway Mallampati: III  TM Distance: >3 FB Neck ROM: Full    Dental  (+) Teeth Intact   Pulmonary sleep apnea and Continuous Positive Airway Pressure Ventilation ,    breath sounds clear to auscultation       Cardiovascular hypertension, Pt. on medications (-) angina(-) Past MI and (-) CHF  Rhythm:Regular     Neuro/Psych PSYCHIATRIC DISORDERS Depression  Neuromuscular disease    GI/Hepatic GERD  Medicated and Controlled,  Endo/Other  diabetes, Type 2, Insulin DependentMorbid obesity  Renal/GU      Musculoskeletal   Abdominal   Peds  Hematology   Anesthesia Other Findings   Reproductive/Obstetrics                             Anesthesia Physical Anesthesia Plan  ASA: III  Anesthesia Plan: General   Post-op Pain Management:    Induction: Intravenous  Airway Management Planned: Oral ETT  Additional Equipment: None  Intra-op Plan:   Post-operative Plan: Extubation in OR  Informed Consent: I have reviewed the patients History and Physical, chart, labs and discussed the procedure including the risks, benefits and alternatives for the proposed anesthesia with the patient or authorized representative who has indicated his/her understanding and acceptance.   Dental advisory given  Plan Discussed with: CRNA and Surgeon  Anesthesia Plan Comments:         Anesthesia Quick Evaluation

## 2017-04-25 NOTE — ED Triage Notes (Signed)
Pt st's she had surg this am for a biopsy of her neck.  St's when she got home she can't stop coughing.  Was told to come to ED for chest x-ray

## 2017-04-25 NOTE — Op Note (Signed)
NAMESEMAJ, KHAM NO.:  [id number]  MEDICAL RECORD NO.:  [id number]  LOCATION:                                 FACILITY:  PHYSICIAN:  Revonda Standard. Roxan Hockey, M.D. DATE OF BIRTH:  DATE OF PROCEDURE:  04/25/2017 DATE OF DISCHARGE:                              OPERATIVE REPORT   PREOPERATIVE DIAGNOSIS:  Mediastinal adenopathy.  POSTOPERATIVE DIAGNOSIS:  Noncaseating granulomas consistent with sarcoidosis.  PROCEDURE:  Mediastinoscopy.  SURGEON:  Revonda Standard. Roxan Hockey, M.D.  ASSISTANT:  None.  ANESTHESIA:  General.  FINDINGS:  Multiple enlarged lymph nodes.  Frozen section revealed noncaseating granulomas.  CLINICAL NOTE:  Ms. Mesch is a 56 year old woman with a past medical history significant for morbid obesity, diabetes, hypertension and hypercholesterolemia.  She has been feeling poorly for about 5 months and has lost 40 pounds over the past 3 months.  She also has had a persistent cough since December.  Workup included a CT scan, which showed widespread adenopathy.  A PET-CT showed the lymph nodes were hypermetabolic.  She was referred for mediastinoscopy with primary differential being between lymphoma versus sarcoidosis.  The indications, risks, benefits and alternatives were discussed in detail with the patient.  She understood this was a diagnostic and not a therapeutic procedure.  She accepted the risks and agreed to proceed.  OPERATIVE NOTE:  Mrs. Marasco was brought to the operating room on Apr 25, 2017.  She had induction of general anesthesia and was intubated. Intravenous antibiotics were administered.  The neck and chest were prepped and draped in usual sterile fashion.  A transverse incision was made 1 fingerbreadth above the sternal notch. It was carried through the skin and subcutaneous tissue.  The platysma was divided.  The strap muscles were separated in the midline.  The pretracheal fascia was identified and incised, and the  pretracheal plane was developed bluntly into the mediastinum with fingertip dissection.  Mediastinoscope was inserted and systematic inspection was carried out. The space was relatively tight.  There was a markedly enlarged 4R lymph node, approximately 2 cm in size.  Several biopsies were taken of this node and sent for frozen section.  Additional biopsies were obtained of the node.  Ultimately, the entire node was removed for permanent pathology. The scope was withdrawn slightly and multiple abnormal, but smaller level 2R nodes were present.  One of these nodes was removed in total. Biopsies were obtained of the other node for AFB and fungal cultures. The frozen section returned showing noncaseating granulomas.  After doing the biopsies, the wound was packed with gauze for 5 minutes.  The gauze packing was removed.  The mediastinoscope was reinserted.  There was good hemostasis.  The mediastinoscope was withdrawn. The incision was closed in 2 layers with interrupted 3-0 Vicryl closure of the platysma followed by a 4-0 Vicryl subcuticular suture.  All sponge, needle and instrument counts were correct at the end of the procedure.  The patient was extubated in the operating room and taken to the postanesthetic care unit in good condition.     Revonda Standard Roxan Hockey, M.D.     SCH/MEDQ  D:  04/25/2017  T:  04/25/2017  Job:  [id number]  

## 2017-04-25 NOTE — Interval H&P Note (Signed)
History and Physical Interval Note: Had emesis yesterday. None this AM. No fever. Will proceed with mediastinoscopy  04/25/2017 7:53 AM  Hannah Schultz  has presented today for surgery, with the diagnosis of MEDIASTINAL ADENOPATHY  The various methods of treatment have been discussed with the patient and family. After consideration of risks, benefits and other options for treatment, the patient has consented to  Procedure(s): MEDIASTINOSCOPY (N/A) as a surgical intervention .  The patient's history has been reviewed, patient examined, no change in status, stable for surgery.  I have reviewed the patient's chart and labs.  Questions were answered to the patient's satisfaction.     Melrose Nakayama

## 2017-04-25 NOTE — H&P (View-Only) (Signed)
PCP is Sinda Du, MD Referring Provider is Sinda Du, MD  Chief Complaint  Patient presents with  . Adenopathy    thoracic/abdominal per CT CHEST 01/29/17 PET 04/04/17    HPI: Hannah Schultz is sent for consultation regarding mediastinal adenopathy.  Hannah Schultz is a 56 year old woman with a past medical history significant for diabetes complicated by neuropathy, hypertension, hypercholesterolemia, depression, gastroesophageal reflux, arthritis, posterior max stress disorder, and sleep apnea. She's been feeling poorly since the first of the year. She's lost her appetite and has lost about 40 pounds over the past 3 months. She has had a cough since December. She has had some chills but no fevers or night sweats.  She had a CT done which showed widespread adenopathy, including the mediastinal and hilar lymph nodes and upper abdominal lymph nodes. A PET CT showed hypermetabolic activity in her enlarged lymph nodes. She was scheduled for CT-guided biopsy of intra-abdominal lymph nodes, but the radiologist did not feel it to be done safely. She now is for consideration for mediastinoscopy.  Zubrod Score: At the time of surgery this patient's most appropriate activity status/level should be described as: $RemoveBefor'[]'UQlEQPxTzaSv$     0    Normal activity, no symptoms $RemoveBef'[]'pYABMrmbre$     1    Restricted in physical strenuous activity but ambulatory, able to do out light work $RemoveBe'[x]'zrAHmACyW$     2    Ambulatory and capable of self care, unable to do work activities, up and about >50 % of waking hours                              '[]'$     3    Only limited self care, in bed greater than 50% of waking hours $RemoveBefo'[]'jPFjftHXwDT$     4    Completely disabled, no self care, confined to bed or chair $Remove'[]'MaGaUvg$     5    Moribund  Past Medical History:  Diagnosis Date  . Acid reflux   . Arthritis   . Bulging lumbar disc   . Depression   . Diabetes mellitus   . Diabetic neuropathy ([place])   . High cholesterol   . Hypertension   . Post traumatic stress disorder (PTSD)   .  Sleep apnea     Past Surgical History:  Procedure Laterality Date  . ABDOMINAL HYSTERECTOMY    . CHOLECYSTECTOMY    . COLONOSCOPY    . TONSILLECTOMY  04/02/2012   Procedure: TONSILLECTOMY;  Surgeon: Ascencion Dike, MD;  Location: [facility];  Service: ENT;  Laterality: Bilateral;    Family History  Problem Relation Age of Onset  . Heart disease    . Cancer    . Diabetes      Social History Social History  Substance Use Topics  . Smoking status: Never Smoker  . Smokeless tobacco: Never Used  . Alcohol use No    Current Outpatient Prescriptions  Medication Sig Dispense Refill  . Cholecalciferol (VITAMIN D3) 5000 UNITS TABS Take 1 tablet by mouth daily.    Marland Kitchen FLUoxetine (PROZAC) 20 MG tablet Take 20 mg by mouth daily.    Marland Kitchen gabapentin (NEURONTIN) 300 MG capsule Take 600 mg by mouth 3 (three) times daily.     Marland Kitchen glucose blood (ONE TOUCH ULTRA TEST) test strip 3 times per day 100 each 0  . Insulin Aspart (NOVOLOG FLEXPEN [place]) Inject 20 Units into the skin 3 (three) times daily.     . Insulin  Degludec (TRESIBA FLEXTOUCH) 200 UNIT/ML SOPN Inject 50 Units into the skin every morning.     . Insulin Pen Needle (BD PEN NEEDLE NANO U/F) 32G X 4 MM MISC 1 each by Does not apply route 4 (four) times daily. 150 each 3  . nebivolol (BYSTOLIC) 5 MG tablet Take 5 mg by mouth daily.      Marland Kitchen NOVOLIN N RELION 100 UNIT/ML injection Inject 30 Units into the skin 2 (two) times daily.     . ondansetron (ZOFRAN) 4 MG tablet Take 4 mg by mouth every 4 (four) hours as needed for nausea or vomiting.    . promethazine (PHENERGAN) 25 MG tablet Take 25 mg by mouth every 6 (six) hours as needed for nausea or vomiting.    . ranitidine (ZANTAC) 75 MG tablet Take 75 mg by mouth at bedtime.    . simvastatin (ZOCOR) 40 MG tablet Take 1 tablet (40 mg total) by mouth every evening. 30 tablet 5  . sitaGLIPtin (JANUVIA) 100 MG tablet Take 100 mg by mouth daily.     No current facility-administered medications for this visit.      Allergies  Allergen Reactions  . Adhesive [Tape] Other (See Comments)    Adhesive tape "tears skin"    Review of Systems  Constitutional: Positive for activity change, appetite change, chills and unexpected weight change (Lost 40 pounds in 3 months). Negative for diaphoresis and fever.  HENT: Positive for trouble swallowing. Negative for voice change.   Eyes: Negative for visual disturbance.  Respiratory: Positive for apnea and wheezing (2 months ago).   Cardiovascular: Negative for chest pain and leg swelling.  Gastrointestinal: Positive for abdominal pain (Frequent heartburn). Negative for blood in stool.  Genitourinary: Negative for difficulty urinating and dysuria.  Musculoskeletal: Positive for arthralgias and joint swelling.       Chronic pain in legs  Skin:       Itching  Neurological: Positive for numbness (Feet and legs). Negative for weakness.       Chronic pain in legs  Hematological: Positive for adenopathy. Does not bruise/bleed easily.  Psychiatric/Behavioral: Positive for dysphoric mood. The patient is not nervous/anxious.   All other systems reviewed and are negative.   BP 113/75 (BP Location: Left Arm, Patient Position: Sitting, Cuff Size: Large)   Pulse 67   Resp 16   Ht $R'5\' 5"'pJ$  (1.651 m)   Wt 255 lb (115.7 kg)   SpO2 (!) 86% Comment: ON RA  BMI 42.43 kg/m  Physical Exam  Constitutional: She is oriented to person, place, and time. No distress.  Obese 56 year old woman in wheelchair  HENT:  Head: Normocephalic and atraumatic.  Mouth/Throat: No oropharyngeal exudate.  Eyes: Conjunctivae and EOM are normal. No scleral icterus.  Neck: Neck supple. No thyromegaly present.  Unable to palpate adenopathy, likely due to body habitus  Cardiovascular: Normal rate, regular rhythm and normal heart sounds.   No murmur heard. Pulmonary/Chest: Effort normal and breath sounds normal. No respiratory distress. She has no wheezes. She has no rales.  Abdominal: Soft.  There is no tenderness.  Obese  Musculoskeletal: She exhibits no edema.  Lymphadenopathy:    She has no cervical adenopathy.  Neurological: She is alert and oriented to person, place, and time. No cranial nerve deficit. She exhibits normal muscle tone.  Skin: Skin is warm and dry.  Vitals reviewed.    Diagnostic Tests: CT CHEST WITH CONTRAST  TECHNIQUE: Multidetector CT imaging of the chest was performed during intravenous contrast  administration.  CONTRAST:  61mL ISOVUE-300 IOPAMIDOL (ISOVUE-300) INJECTION 61%  COMPARISON:  Chest radiograph performed earlier today at 3:15 p.m.  FINDINGS: Cardiovascular: The heart is normal in size. The thoracic aorta is unremarkable. The great vessels are within normal limits. No calcific atherosclerotic disease is seen.  Mediastinum/Nodes: Enlarged mediastinal and bilateral hilar nodes are seen, measuring up to 1.8 cm at the mediastinum, 2.2 cm at the right hilum and 1.5 cm at the left hilum. Enlarged mediastinal nodes are noted at the right paratracheal, periaortic and subcarinal regions. No pericardial effusion is identified. The thyroid gland contains minimal hypodensities, but is otherwise unremarkable. No axillary lymphadenopathy is seen.  Lungs/Pleura: Minimal bibasilar atelectasis is noted. The lungs are otherwise clear. No pleural effusion or pneumothorax is seen. No masses are identified.  Upper Abdomen: The visualized portions of the liver are unremarkable. The spleen is somewhat bulky, but otherwise grossly unremarkable. The patient is status post cholecystectomy, with clips noted at the gallbladder fossa. The visualized portions of the pancreas, adrenal glands and kidneys are within normal limits. Prominent nodes are seen about the celiac trunk, measuring up to 1.5 cm in short axis.  Musculoskeletal: No acute osseous abnormalities are identified. The visualized musculature is unremarkable in  appearance.  IMPRESSION: 1. Enlarged mediastinal and bilateral hilar nodes, measuring up to 2.2 cm. Enlarged nodes at the upper abdomen, about the celiac trunk. This could reflect lymphoma, sarcoidosis or a variety of inflammatory conditions. The appearance is less typical for metastatic disease. A number of the mediastinal nodes would be amenable to biopsy, as deemed clinically appropriate. 2. Minimal bibasilar atelectasis noted.  Lungs otherwise clear.   Electronically Signed   By: Garald Balding M.D.   On: 01/29/2017 21:58 NUCLEAR MEDICINE PET SKULL BASE TO THIGH  TECHNIQUE: 12.6 mCi F-18 FDG was injected intravenously. Full-ring PET imaging was performed from the skull base to thigh after the radiotracer. CT data was obtained and used for attenuation correction and anatomic localization.  FASTING BLOOD GLUCOSE:  Value: 156 mg/dl  COMPARISON:  CT on 01/29/2017  FINDINGS: NECK  10 mm hypermetabolic scalp lymph node seen in the right postauricular region on image 11/4, with SUV max of 9.7. Small hypermetabolic lymph nodes are seen in both parotid glands, largest on the right measuring 9 mm with SUV max of 11.7. Tiny hypermetabolic lymph node is seen in the left submandibular region, level 1B, and hypermetabolic bilateral supraclavicular lymph nodes are seen, largest on the right measuring 12 mm on image 44/4, with SUV max of 16.9. A 6 mm hypermetabolic lymph node is seen in the left upper posterior chest wall deep subcutaneous tissues.  CHEST  Hypermetabolic lymphadenopathy is seen throughout the mediastinum and bilateral hilar regions. Index lymph node in right paratracheal region on 59/4 has SUV max of 24.3. No hypermetabolic axillary lymph nodes identified.  Bilateral upper lobe pulmonary nodules are increased in size, largest in the right upper lobe measuring 6 mm on image 28/8, and in the left upper lobe measuring 6 mm on image 27/8. These show no  FDG uptake, but are too small to characterize by CT.  ABDOMEN/PELVIS  No abnormal hypermetabolic activity within the liver, pancreas, adrenal glands, or spleen.  Hypermetabolic lymphadenopathy is seen within the porta hepatis, gastrohepatic ligament and retroperitoneum. Largest nodal mass in the porta hepatis measures 4.7 cm on image 98/4, with SUV max of 22.8. Small less than 1 cm hypermetabolic lymph nodes are also seen within the small bowel mesentery and the right  common iliac chain.  SKELETON  Several hypermetabolic foci are seen along the pleural spaces and several posterior ribs bilaterally. No corresponding bone lesion or soft tissue nodules are identified. This may be due to pleural or osseous involvement.  IMPRESSION: Hypermetabolic lymphadenopathy throughout the neck, chest, and abdomen, suspicious for lymphoma.  Sub-cm bilateral upper lobe pulmonary nodules show no FDG uptake, but are too small to characterize by CT. Recommend continued attention on follow-up CT.  Several hypermetabolic foci along the bilateral pleural spaces and posterior ribs, without corresponding abnormality on CT images. These may be due to pleural or osseous involvement.   Electronically Signed   By: Earle Gell M.D.   On: 04/04/2017 17:06 I personally reviewed the CT chest and PET/CT concur with the findings noted above   Impression: Hannah Schultz is a 56 year old woman who has been ill for several months with cough, chills, general malaise, loss of appetite, and extreme weight loss (40 pounds). Workup has revealed extensive adenopathy in both the chest and abdomen. The intra-abdominal pathology was not felt suitable for biopsy due to the need to cross the liver.  Differential diagnosis is primarily lymphoma versus sarcoidosis. I think lymphoma is far more likely in her case. A tissue diagnosis is needed to allow for appropriate therapy to be given. Mediastinoscopy is certainly a  reasonable option in this case. Although there is no absolute guarantee of the diagnosis, I think it would be highly unusual not to get a diagnosis given the accessibility of the lymph nodes.  I described the procedure of mediastinoscopy to Hannah Schultz, her husband and her sister. They understand this is an operative procedure and will involve an incision and general anesthesia. We will plan to do it on an outpatient basis. They understand it is diagnostic and not therapeutic. We discussed the indications, risks, benefits, and alternatives. They understand the risk include, but are not limited to death, MI, DVT, PE, stroke, bleeding, possible need for transfusion or thoracotomy, infection, the thorax, recurrent nerve injury leading to hoarseness, esophageal injury, as well as possibility of other unforeseeable complications.  She understands and accepts the risks and wishes to proceed.  Plan: Mediastinoscopy on Thursday, 04/25/2017  Melrose Nakayama, MD [facility] [phone]

## 2017-04-25 NOTE — Discharge Instructions (Signed)
You may use the prescribed cough syrup as prescribed for cough. Do not mix taking this cough syrup with your oral pain medications as this can reduce your breathing rate or other dangerous side effects. Be sure to drink plenty of fluids to stay well hydrated.  Follow-up with your surgeon's office tomorrow. Return to the ED should symptoms worsen.

## 2017-04-25 NOTE — ED Provider Notes (Signed)
[place] DEPT Provider Note   CSN: [id number] Arrival date & time: 04/25/17  1813     History   Chief Complaint Chief Complaint  Patient presents with  . Cough    HPI Hannah Schultz is a 56 y.o. female.  HPI   Hannah Schultz is a 56 y.o. female, with a history of Acid reflux, and DM, and hypertension., presenting to the ED with nonproductive cough beginning today following a mediastinal biopsy. She also endorses intermittent vomiting that has been continuing over the last couple months. Called Dr. Leonarda Salon office and was told to come to the ED for a CXR.   Denies fever/chills, CP, shortness breath, or any other complaints.        Past Medical History:  Diagnosis Date  . Acid reflux   . Arthritis   . Bulging lumbar disc   . Depression   . Diabetes mellitus   . Diabetic neuropathy ([place])   . High cholesterol   . Hypertension   . Post traumatic stress disorder (PTSD)   . Sleep apnea     Patient Active Problem List   Diagnosis Date Noted  . Other and unspecified hyperlipidemia 09/19/2013  . Unspecified essential hypertension 09/19/2013  . Type II or unspecified type diabetes mellitus without mention of complication, uncontrolled 07/20/2013  . Trigger thumb of right hand 11/27/2012  . CTS (carpal tunnel syndrome) 11/27/2012    Past Surgical History:  Procedure Laterality Date  . ABDOMINAL HYSTERECTOMY    . CHOLECYSTECTOMY    . COLONOSCOPY    . TONSILLECTOMY  04/02/2012   Procedure: TONSILLECTOMY;  Surgeon: Ascencion Dike, MD;  Location: [facility] OR;  Service: ENT;  Laterality: Bilateral;    OB History    Gravida Para Term Preterm AB Living             2   SAB TAB Ectopic Multiple Live Births                   Home Medications    Prior to Admission medications   Medication Sig Start Date End Date Taking? Authorizing Provider  Cholecalciferol (VITAMIN D3) 5000 UNITS TABS Take 5,000 Units by mouth daily.     [provider]  FLUoxetine  (PROZAC) 20 MG tablet Take 20 mg by mouth daily.    [provider]  gabapentin (NEURONTIN) 300 MG capsule Take 600 mg by mouth 3 (three) times daily.     [provider]  glucose blood (ONE TOUCH ULTRA TEST) test strip 3 times per day 12/29/13   Elayne Snare, MD  HYDROcodone-homatropine [facility]) 5-1.5 MG/5ML syrup Take 5 mLs by mouth every 6 (six) hours as needed for cough. 04/25/17   Taelar Gronewold C, PA-C  insulin degludec (TRESIBA FLEXTOUCH) 100 UNIT/ML SOPN FlexTouch Pen Inject 40-80 Units into the skin daily before breakfast.    [provider]  Insulin Pen Needle (BD PEN NEEDLE NANO U/F) 32G X 4 MM MISC 1 each by Does not apply route 4 (four) times daily. 08/12/13   Elayne Snare, MD  metoCLOPramide (REGLAN) 10 MG tablet Take 1 tablet (10 mg total) by mouth every 8 (eight) hours as needed for nausea. 04/23/17   Maryan Puls, MD  nebivolol (BYSTOLIC) 5 MG tablet Take 5 mg by mouth daily.      [provider]  NOVOLIN N RELION 100 UNIT/ML injection Inject 40 Units into the skin 3 (three) times daily before meals. PER SLIDING SCALE 12/13/16   [provider]  ondansetron (ZOFRAN) 4 MG tablet Take 4 mg by mouth every 4 (four) hours as needed for nausea or vomiting.    [provider]  oxyCODONE (OXY IR/ROXICODONE) 5 MG immediate release tablet Take 1-2 tablets (5-10 mg total) by mouth every 6 (six) hours as needed for moderate pain or severe pain. 04/25/17   Melrose Nakayama, MD  promethazine (PHENERGAN) 25 MG tablet Take 25 mg by mouth every 6 (six) hours as needed for nausea or vomiting.    [provider]  ranitidine (ZANTAC) 75 MG tablet Take 75 mg by mouth at bedtime.    [provider]  simvastatin (ZOCOR) 40 MG tablet Take 1 tablet (40 mg total) by mouth every evening. Patient taking differently: Take 40 mg by mouth at bedtime.  09/22/13   Elayne Snare, MD  sitaGLIPtin (JANUVIA) 100 MG tablet Take 100 mg by mouth every morning.      [provider]    Family History Family History  Problem Relation Age of Onset  . Heart disease Unknown   . Cancer Unknown   . Diabetes Unknown     Social History Social History  Substance Use Topics  . Smoking status: Never Smoker  . Smokeless tobacco: Never Used  . Alcohol use No     Allergies   Adhesive [tape]   Review of Systems Review of Systems  Constitutional: Negative for chills and fever.  Respiratory: Positive for cough. Negative for shortness of breath.   Cardiovascular: Negative for chest pain.  Gastrointestinal: Positive for nausea and vomiting. Negative for abdominal pain and diarrhea.  Neurological: Negative for dizziness and light-headedness.  All other systems reviewed and are negative.    Physical Exam Updated Vital Signs BP (!) 160/98 (BP Location: Right Arm)   Pulse 74   Temp 98.3 F (36.8 C) (Oral)   Resp 18   Ht $R'5\' 5"'zJ$  (1.651 m)   Wt 230 lb (104.3 kg)   SpO2 99%   BMI 38.27 kg/m   Physical Exam  Constitutional: She appears well-developed and well-nourished. No distress.  HENT:  Head: Normocephalic and atraumatic.  Patient readily handles oral secretions without difficulty.  Eyes: Conjunctivae are normal.  Neck: Normal range of motion. Neck supple.  Cardiovascular: Normal rate, regular rhythm, normal heart sounds and intact distal pulses.   Pulmonary/Chest: Effort normal and breath sounds normal. No respiratory distress.  No noted increased work of breathing. Patient has frequent coughing.  Abdominal: Soft. There is no guarding.  Musculoskeletal: She exhibits no edema.  Lymphadenopathy:    She has no cervical adenopathy.  Neurological: She is alert.  Skin: Skin is warm and dry. She is not diaphoretic.  Patient has an incision at the base of the anterior neck with no signs of dehiscence or exudate. There is no noted surrounding erythema, significant swelling, or subcutaneous emphysema.  Psychiatric: She has a normal mood  and affect. Her behavior is normal.  Nursing note and vitals reviewed.    ED Treatments / Results  Labs (all labs ordered are listed, but only abnormal results are displayed) Labs Reviewed - No data to display  EKG  EKG Interpretation None       Radiology Dg Chest 2 View  Result Date: 04/25/2017 CLINICAL DATA:  Mediastinal procedure of the with cough EXAM: CHEST  2 VIEW COMPARISON:  04/23/2017 FINDINGS: No focal pulmonary infiltrate, consolidation, or pleural effusion. Mildly low lung volume with mild atelectasis at the right base. Heart size upper normal.  Stable mediastinal silhouette with known hilar nodes. No pneumothorax. IMPRESSION: Low lung volumes with mild basilar atelectasis. Electronically Signed   By: Donavan Foil M.D.   On: 04/25/2017 18:56    Procedures Procedures (including critical care time)  Medications Ordered in ED Medications  chlorpheniramine-HYDROcodone (TUSSIONEX) 10-8 MG/5ML suspension 5 mL (5 mLs Oral Given 04/25/17 2208)  promethazine (PHENERGAN) tablet 12.5 mg (12.5 mg Oral Given 04/25/17 2210)     Initial Impression / Assessment and Plan / ED Course  I have reviewed the triage vital signs and the nursing notes.  Pertinent labs & imaging results that were available during my care of the patient were reviewed by me and considered in my medical decision making (see chart for details).  Clinical Course as of Apr 25 2309  Thu Apr 25, 2017  2255 Patient states she feels much better. Her coughing, pain, and nausea have resolved. She is able to pass an oral fluid challenge without difficulty.  [SJ]    Clinical Course User Index [SJ] Haleemah Buckalew C, PA-C    Patient's coughing may be due to irritation from intubation or from the procedure itself. Patient is nontoxic appearing, afebrile, not tachycardic, not tachypneic, not hypotensive, maintains SPO2 of 98% on room air, and is in no apparent distress. Patient has no signs of sepsis or other serious or  life-threatening condition. CT surgery follow-up. Return precautions discussed. Patient voices understanding of all instructions and is comfortable with discharge.  Vitals:   04/25/17 2052 04/25/17 2054 04/25/17 2100 04/25/17 2116  BP: (!) 179/80 (!) 179/80  (!) 160/98  Pulse:  72 73 74  Resp:  16  18  Temp:      TempSrc:      SpO2:  98% 99% 99%  Weight:      Height:         Final Clinical Impressions(s) / ED Diagnoses   Final diagnoses:  Cough    New Prescriptions New Prescriptions   HYDROCODONE-HOMATROPINE (HYCODAN) 5-1.5 MG/5ML SYRUP    Take 5 mLs by mouth every 6 (six) hours as needed for cough.     Lorayne Bender, PA-C 04/25/17 2310    Sherwood Gambler, MD 04/26/17 [id number]

## 2017-04-25 NOTE — Anesthesia Procedure Notes (Signed)
Procedure Name: Intubation Date/Time: 04/25/2017 8:07 AM Performed by: Mariea Clonts Pre-anesthesia Checklist: Patient identified, Emergency Drugs available, Suction available and Patient being monitored Patient Re-evaluated:Patient Re-evaluated prior to inductionOxygen Delivery Method: Circle System Utilized Preoxygenation: Pre-oxygenation with 100% oxygen Intubation Type: IV induction, Cricoid Pressure applied and Rapid sequence Laryngoscope Size: Miller and 2 Tube type: Oral Tube size: 7.0 mm Number of attempts: 1 Airway Equipment and Method: Stylet and Oral airway Placement Confirmation: ETT inserted through vocal cords under direct vision,  positive ETCO2 and breath sounds checked- equal and bilateral Secured at: 22 cm Tube secured with: Tape Dental Injury: Teeth and Oropharynx as per pre-operative assessment

## 2017-04-25 NOTE — Brief Op Note (Signed)
04/25/2017  9:34 AM  PATIENT:  Hannah Schultz  56 y.o. female  PRE-OPERATIVE DIAGNOSIS:  MEDIASTINAL ADENOPATHY  POST-OPERATIVE DIAGNOSIS:  MEDIASTINAL ADENOPATHY- Non Caseating Granulomas  PROCEDURE:  Procedure(s): MEDIASTINOSCOPY (N/A)  SURGEON:  Surgeon(s) and Role:    * Melrose Nakayama, MD - Primary  ASSISTANTS: none   ANESTHESIA:   general  EBL:  Total I/O In: 900 [I.V.:900] Out: 20 [Blood:20]  BLOOD ADMINISTERED:none  DRAINS: none   LOCAL MEDICATIONS USED:  NONE  SPECIMEN:  Source of Specimen:  mediastinal lymph nodes  DISPOSITION OF SPECIMEN:  Path and micro  COUNTS:  YES  PLAN OF CARE: Discharge to home after PACU  PATIENT DISPOSITION:  PACU - hemodynamically stable.   Delay start of Pharmacological VTE agent (>24hrs) due to surgical blood loss or risk of bleeding: not applicable

## 2017-04-25 NOTE — Transfer of Care (Signed)
Immediate Anesthesia Transfer of Care Note  Patient: Hannah Schultz  Procedure(s) Performed: Procedure(s): MEDIASTINOSCOPY (N/A)  Patient Location: PACU  Anesthesia Type:General  Level of Consciousness: awake, alert  and oriented  Airway & Oxygen Therapy: Patient Spontanous Breathing  Post-op Assessment: Report given to RN and Post -op Vital signs reviewed and stable  Post vital signs: Reviewed  Last Vitals:  Vitals:   04/25/17 0618 04/25/17 0930  BP: (!) 156/77 (!) (P) 144/66  Pulse: 72 (P) 93  Resp: 20 (P) 15  Temp: 36.9 C (P) 36.9 C    Last Pain:  Vitals:   04/25/17 0658  TempSrc:   PainSc: 0-No pain      Patients Stated Pain Goal: 2 (77/93/96 8864)  Complications: No apparent anesthesia complications

## 2017-04-25 NOTE — Discharge Instructions (Addendum)
Do not drive or engage in heavy physical activity for 3 days. After that your physical activities are unlimited. You should not drive within 6 hours of taking the pain medication  You may shower tomorrow  There is a medical adhesive over the incision. It will begin to peel off in 10-14 days  You have a prescription for oxycodone, a narcotic pain reliever. You may use as directed. Do not exceed the prescribed dose. The most common side effect is nausea. You may use acetaminophen (Tylenol) or ibuprofen (Advil, Motrin) in addition to, or instead of, the oxycodone  Call [phone] if you develop chest pain, shortness of breath, fever > 101F or notice excessive swelling, redness or drainage from the incision  My office will contact you with follow up information

## 2017-04-26 ENCOUNTER — Other Ambulatory Visit: Payer: Self-pay | Admitting: *Deleted

## 2017-04-26 ENCOUNTER — Encounter (HOSPITAL_COMMUNITY): Payer: Self-pay | Admitting: Thoracic Surgery (Cardiothoracic Vascular Surgery)

## 2017-04-26 LAB — ACID FAST SMEAR (AFB, MYCOBACTERIA): Acid Fast Smear: NEGATIVE

## 2017-04-26 LAB — ACID FAST SMEAR (AFB)

## 2017-04-26 NOTE — Patient Outreach (Signed)
[facility]) Care Management   04/26/2017  CHONTE RICKE Oct 28, 1961 [id number]  FLORENE BRILL is an 56 y.o. female being followed by [facility] CM for care coordination and education related DM management, and recent present diagnostic tests and procedures (abdominal pain, vomiting, nausea, cough since December 2017) and has been seen previously by [place] for medication assistance. Mrs Cai has a PMH of DM Type II, hypertension, hyperlipidemia, morbid obesity, GERD, carpal tunnel syndrome, neuropathy, depression, PTSD, sleep apnea, arthritis and trigger thumb of right hand.   Recent nonspecific chest pain and mediastinal adenopathy indicated recommendation for follow up with pcp for the evaluation of the 01/29/17 abnormal Chest CT imaging that showed "Enlarged mediastinal and bilateral hilar nodes, measuring up to 2.2 cm. Enlarged nodes at the upper abdomen, about the celiac trunk. This could reflect lymphoma, sarcoidosis or a variety of inflammatory conditions. The appearance is less typical for metastatic disease. A number of the mediastinal nodes would be amenable to biopsy, as deemed clinically appropriate." Plus "minimal bibasilar atelectasis noted. 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 Negative for chills, diaphoresis, fever and  malaise/fatigue.  HENT: Negative.  Negative for congestion, ear discharge, ear pain, hearing loss, nosebleeds, sinus pain, sore throat and tinnitus.   Eyes: Negative.  Negative for blurred vision, double vision, photophobia, pain, discharge and redness.  Respiratory: Positive for cough. Negative for hemoptysis, sputum production, shortness of breath, wheezing and stridor.   Cardiovascular: Negative.  Negative for chest pain, palpitations, orthopnea, claudication, leg swelling and PND.  Gastrointestinal: Negative.  Negative for abdominal pain, blood in stool, constipation, diarrhea, heartburn, melena, nausea and vomiting.  Genitourinary: Negative.  Negative for dysuria, flank pain, frequency, hematuria and urgency.  Musculoskeletal: Negative.  Negative for back pain, falls, joint pain, myalgias and neck pain.  Skin: Negative.  Negative for itching and rash.  Neurological: Positive for weakness. Negative for dizziness, tingling, tremors, sensory change, speech change, focal weakness, seizures, loss of consciousness and headaches.  Endo/Heme/Allergies: Negative.  Negative for environmental allergies and polydipsia. Does not bruise/bleed easily.  Psychiatric/Behavioral: Negative.  Negative for depression, hallucinations, memory loss, substance abuse and suicidal ideas. The patient is not nervous/anxious and does not have insomnia.     Physical Exam  Constitutional: She is oriented to person, place, and time. She appears well-developed and well-nourished.  HENT:  Head: Normocephalic and atraumatic.  Right Ear: External ear normal.  Left Ear: External ear normal.  Nose: Nose normal.  Eyes: Conjunctivae and EOM are normal. Pupils are equal, round, and reactive to light.  Neck: Normal range of motion.  Swollen neck, face and puffy eyes  Cardiovascular: Normal rate, regular rhythm, normal heart sounds and intact distal pulses.   Respiratory: Effort normal and breath sounds normal.  GI: Soft. Bowel  sounds  are normal.  Musculoskeletal: Normal range of motion.  Neurological: She is alert and oriented to person, place, and time.  Skin: Skin is warm and dry.  Psychiatric: She has a normal mood and affect. Her behavior is normal. Judgment and thought content normal.    Encounter Medications:   Outpatient Encounter Prescriptions as of 04/26/2017  Medication Sig Note  . Cholecalciferol (VITAMIN D3) 5000 UNITS TABS Take 5,000 Units by mouth daily.    Marland Kitchen FLUoxetine (PROZAC) 20 MG tablet Take 20 mg by mouth daily.   Marland Kitchen gabapentin (NEURONTIN) 300 MG capsule Take 600 mg by mouth 3 (three) times daily.    Marland Kitchen glucose blood (ONE TOUCH ULTRA TEST) test strip 3 times per day   . HYDROcodone-homatropine (HYCODAN) 5-1.5 MG/5ML syrup Take 5 mLs by mouth every 6 (six) hours as needed for cough.   . insulin degludec (TRESIBA FLEXTOUCH) 100 UNIT/ML SOPN FlexTouch Pen Inject 40-80 Units into the skin daily before breakfast. 04/23/2017: Dosage was verified by the patient. She stated that she uses Tresiba 100 units/ml, NOT Tresiba 200 units/ml!!  . Insulin Pen Needle (BD PEN NEEDLE NANO U/F) 32G X 4 MM MISC 1 each by Does not apply route 4 (four) times daily.   . metoCLOPramide (REGLAN) 10 MG tablet Take 1 tablet (10 mg total) by mouth every 8 (eight) hours as needed for nausea.   . nebivolol (BYSTOLIC) 5 MG tablet Take 5 mg by mouth daily.     Marland Kitchen NOVOLIN N RELION 100 UNIT/ML injection Inject 40 Units into the skin 3 (three) times daily before meals. PER SLIDING SCALE 04/23/2017: Dosage verified by the patient  . ondansetron (ZOFRAN) 4 MG tablet Take 4 mg by mouth every 4 (four) hours as needed for nausea or vomiting.   Marland Kitchen oxyCODONE (OXY IR/ROXICODONE) 5 MG immediate release tablet Take 1-2 tablets (5-10 mg total) by mouth every 6 (six) hours as needed for moderate pain or severe pain.   . promethazine (PHENERGAN) 25 MG tablet Take 25 mg by mouth every 6 (six) hours as needed for nausea or vomiting.   . ranitidine  (ZANTAC) 75 MG tablet Take 75 mg by mouth at bedtime.   . simvastatin (ZOCOR) 40 MG tablet Take 1 tablet (40 mg total) by mouth every evening. (Patient taking differently: Take 40 mg by mouth at bedtime. )   . sitaGLIPtin (JANUVIA) 100 MG tablet Take 100 mg by mouth every morning.     No facility-administered encounter medications on file as of 04/26/2017.     Functional Status:   In your present state of health, do you have any difficulty performing the following activities: 04/23/2017 03/22/2017  Hearing? N N  Vision? N N  Difficulty concentrating or making decisions? N N  Walking or climbing stairs? Y N  Dressing or bathing? N N  Doing errands, shopping? N -  Preparing Food and eating ? - -  Using the Toilet? - -  In the past six months, have you accidently leaked urine? - -  Do you have problems with loss of bowel control? - -  Managing your Medications? - -  Managing your Finances? - -  Housekeeping or managing your Housekeeping? - -  Some recent data might be hidden    Fall/Depression Screening:    Fall Risk  03/30/2017 10/30/2016 10/23/2016  Falls in the past year? No No No  Risk for fall due to : - - Impaired mobility   PHQ 2/9 Scores 03/18/2017 10/30/2016 10/23/2016 09/04/2016  PHQ - 2 Score 2 4 0 1  PHQ- 9 Score 6 13 - -    Assessment:   Mrs Ritter presented today with use of a straight cane in her pajamas.at her Dining room table.  Her face and neck are swollen. From her mediastinal biopsy She was attempting to eat small cup of Progresso vegetable soup but informed her husband she preferred to have a smoothie as recommended by an ED provider.  Husband went out to obtain her one and she had a glass of apple juice while awaiting his return.  Reports taste bud changes   Mrs Comunale had 3 visitors during this home visit and was encouraged to remember to get her rest  Acute Health Condition Mrs Treadway noted to walk with shuffled gait with cane initially but then gait noted to be  corrected and swollen face and neck (area look like a goiter) Discharge instructions from ED reviewed. She was given hycodan 5-1.5 mg/5 ml  q 6 hours prn cough She was again encouraged  not to take hycodan with oxycodone When she left the ED she was placed on a clear liquid diet and has been noted to tolerate apple juice and a smoothie during [facility] CM home visit today.  She reports that she has not  been to church because she is afraid she will have an emesis and not make to restroom in time.  She was encouraged to inquire about use of a scopolamine patch since she has ha success since leaving the ED.  Reviewed chest x rays prior and post op Mediastinal biopsy completed on 04/23/17 and 04/25/17  Unable to find last pcp office visit date   GI issues-Reports decreased issues with abdominal symptoms (nausea, vomiting, diarrhea, abdominal pain) today.  [facility]N CM answered questions about her mediastinoscopy and upcoming rpending results from the biopsy. On 04/11/17 [facility] CM weighed Mrs Kitts to document weight of 234 lbs and noted on 04/25/17 her weight was 230 lbs.  Chronic Health Condition (DM, Hypertension, hyperlipidemia) Her DM, hypertension and hyperlipidemia are being managed with medications and decrease in appetite. Marland Kitchen  Her Vital signs were  Last noted HggA1c was 7.1 on 04/23/17  Her VS were WNL during the assessment   Plan:  [facility] CM will follow up with Mrs Hechavarria as needed and will have a follow up home visit in 2 weeks  [facility] CM Care Plan Problem One     Most Recent Value  Care Plan Problem One  Knowledge Deficit related to self health management of Diabetes Mellitus, Type 2  Role Documenting the Problem One  Care Management [place] for Problem One  Active  THN Long Term Goal (31-90 days)  Over the next 60 days, patient will verbalize understanding of changes to diet, medication, and exercise regimen as recommended by her endocrinologist and CDE/RD  [facility] Long Term Goal Start Date   03/28/17  Interventions for Problem One Long Term Goal  Utilizing teachback method, discussed at length most recent changes to DIabetes treatment plan changes  [facility] CM Short Term Goal #1 (0-30 days)  Over the next 7 days,  patient will impletment medication dose change as prescribed and will notify provider with any side effects or cbg findings outside established parameters  [facility]N CM Short Term Goal #1 Start Date  01/25/17  [facility] CM Short Term Goal #1 Met Date  02/05/17  Interventions for Short Term Goal #1  medications reviewed w/ changes to insulin dose  THN CM  Short Term Goal #2 (0-30 days)  Over the next 30 days patient will verbaliize understanding of possible weight reduction surgery as strategy for management of DM  [facility] CM Short Term Goal #2 Start Date  01/25/17  [facility] CM Short Term Goal #2 Met Date  02/19/17  Interventions for Short Term Goal #2  discussed with patient by phone recommendations as made by PCP/PULM?ENDO,  scheudled visit with patient and plan for education re: weight loss surgery, EMMI article, wt loss surgery book for pt to review    [facility] CM Care Plan Problem Two     Most Recent Value  Care Plan Problem Two  acute health condition-chest pain requiring ED visit  Role Documenting the Problem Two  Care Management Coordinator  Care Plan for Problem Two  Active  Interventions for Problem Two Long Term Goal   brief pt examination in home recommended and facilitated ED visit  THN Long Term Goal (31-90) days  over the next 45 days pt will verbalize understanding of plan of care for management of acute chest pain, leg swelling and GI symptoms  THN Long Term Goal Start Date  01/29/17  THN Long Term Goal Met Date  03/28/17  [facility] CM Short Term Goal #1 (0-30 days)  Over the next 7 days, patient will see pcp/pulmonary provider  as recommended  [facility]N CM Short Term Goal #1 Start Date  01/30/17  [facility] CM Short Term Goal #1 Met Date   02/12/17  Interventions for Short Term Goal #2   assisted patient  with pcp/pulmonary provider appointment scheduling,  update not to PCP    [facility] CM Care Plan Problem Three     Most Recent Value  Care Plan Problem Three  Acute Health Condition (peri-umbilical pain and early satiety)  Role Documenting the Problem Three  Care Management Coordinator  Care Plan for Problem Three  Active  THN Long Term Goal (31-90) days  Over the next 45 days, patient will verbalize understsanding of plan of care for acute health condition  THN Long Term Goal Start Date  02/19/17  [facility] Long Term Goal Met Date  03/28/17  Interventions for Problem Three Long Term Goal  Discussed new symptoms and forwarded assessment information to PCP  [facility] CM Short Term Goal #1 (0-30 days)  over the next 30 days the patine will have a PET sccan and be scheduled for a biopsy to assist with definitive medical diagnosis  THN CM Short Term Goal #1 Start Date  03/28/17  Interventions for Short Term Goal #1  reveiw mediastinoscopy,PET scan, sarcoidosis, lymphoma in detaiil, answer questoin ans re educate as needed        [employer] Lavina Hamman, RN, BSN, [place] Care Management [phone]

## 2017-04-26 NOTE — Anesthesia Postprocedure Evaluation (Addendum)
Anesthesia Post Note  Patient: Hannah Schultz  Procedure(s) Performed: Procedure(s) (LRB): MEDIASTINOSCOPY (N/A)  Patient location during evaluation: PACU Anesthesia Type: General Level of consciousness: awake and alert Pain management: pain level controlled Vital Signs Assessment: post-procedure vital signs reviewed and stable Respiratory status: spontaneous breathing, nonlabored ventilation, respiratory function stable and patient connected to nasal cannula oxygen Cardiovascular status: blood pressure returned to baseline and stable Postop Assessment: no signs of nausea or vomiting Anesthetic complications: no       Last Vitals:  Vitals:   04/25/17 1030 04/25/17 1050  BP: 140/75 (!) 151/81  Pulse: 85 85  Resp: 18 20  Temp: 36.7 C     Last Pain:  Vitals:   04/25/17 1015  TempSrc:   PainSc: 5                  Sharina Petre

## 2017-05-03 ENCOUNTER — Telehealth: Payer: Self-pay | Admitting: *Deleted

## 2017-05-03 DIAGNOSIS — E669 Obesity, unspecified: Secondary | ICD-10-CM | POA: Diagnosis not present

## 2017-05-03 DIAGNOSIS — I10 Essential (primary) hypertension: Secondary | ICD-10-CM | POA: Diagnosis not present

## 2017-05-03 DIAGNOSIS — D869 Sarcoidosis, unspecified: Secondary | ICD-10-CM | POA: Diagnosis not present

## 2017-05-03 DIAGNOSIS — E119 Type 2 diabetes mellitus without complications: Secondary | ICD-10-CM | POA: Diagnosis not present

## 2017-05-03 LAB — CBC AND DIFFERENTIAL
HCT: 37 (ref 36–46)
Hemoglobin: 12.2 (ref 12.0–16.0)
Platelets: 183 (ref 150–399)
WBC: 2.4

## 2017-05-03 LAB — BASIC METABOLIC PANEL
Chloride: 97 — AB (ref 99–108)
Potassium: 4.3 (ref 3.4–5.3)
Sodium: 134 — AB (ref 137–147)

## 2017-05-03 LAB — HEPATIC FUNCTION PANEL
ALT: 18 (ref 7–35)
AST: 24 (ref 13–35)
Alkaline Phosphatase: 62 (ref 25–125)

## 2017-05-03 LAB — TSH: TSH: 1.1 (ref <=5.90)

## 2017-05-03 LAB — HEMOGLOBIN A1C: Hemoglobin A1C: 7.3

## 2017-05-03 NOTE — Patient Outreach (Signed)
[facility]) Care Management  05/03/2017  Hannah Schultz [birth year] [id number]   Transition of care  [facility] CM spoke with Hannah Schultz to provide transition of care services She reports she is doing well today and her appetite is returning Reports she is attempting to take in a BLT sandwich today.   She is scheduled to follow up with Dr Roxan Hockey to review results of her cultures/biopsy completed last week She denies issues with cbgs and medications  Plans to see Hannah Schultz for home follow visit next week.  Payton Moder [name], RN, BSN, [place] Care Management [phone]

## 2017-05-07 ENCOUNTER — Encounter: Payer: Self-pay | Admitting: Internal Medicine

## 2017-05-08 ENCOUNTER — Ambulatory Visit (INDEPENDENT_AMBULATORY_CARE_PROVIDER_SITE_OTHER): Payer: PPO | Admitting: Thoracic Surgery (Cardiothoracic Vascular Surgery)

## 2017-05-08 ENCOUNTER — Encounter: Payer: Self-pay | Admitting: Thoracic Surgery (Cardiothoracic Vascular Surgery)

## 2017-05-08 VITALS — BP 119/71 | HR 60 | Resp 16 | Ht 65.0 in | Wt 229.8 lb

## 2017-05-08 DIAGNOSIS — J8410 Pulmonary fibrosis, unspecified: Secondary | ICD-10-CM

## 2017-05-08 DIAGNOSIS — Z09 Encounter for follow-up examination after completed treatment for conditions other than malignant neoplasm: Secondary | ICD-10-CM

## 2017-05-08 DIAGNOSIS — R590 Localized enlarged lymph nodes: Secondary | ICD-10-CM

## 2017-05-08 NOTE — Progress Notes (Signed)
[address]       [place] [address]             [phone]    HPI: Hannah Schultz returns for a scheduled postoperative follow-up visit.  She is a 56 year old woman who presented with poor appetite and 40 pound weight loss over 3 months. She also had a persistent cough for several months. Workup revealed widespread adenopathy which was hypermetabolic by PET/CT. I did mediastinoscopy on 04/25/2017 which revealed granulomatous disease.  She had problems with severe coughing initially after her mediastinal endoscopy and actually went to the emergency room the evening of surgery because of that. Her chest x-ray was unremarkable and her cough improved and she was sent home. Since then she's not had any difficulties. She's not having any pain associated with the incision. She's not had any difficulty swallowing.  She saw Dr. Luan Pulling and says that he started her on prednisone. Past Medical History:  Diagnosis Date  . Acid reflux   . Arthritis   . Bulging lumbar disc   . Depression   . Diabetes mellitus   . Diabetic neuropathy ([place])   . High cholesterol   . Hypertension   . Post traumatic stress disorder (PTSD)   . Sleep apnea      Current Outpatient Prescriptions  Medication Sig Dispense Refill  . Cholecalciferol (VITAMIN D3) 5000 UNITS TABS Take 5,000 Units by mouth daily.     Marland Kitchen FLUoxetine (PROZAC) 20 MG tablet Take 20 mg by mouth daily.    Marland Kitchen gabapentin (NEURONTIN) 300 MG capsule Take 600 mg by mouth 3 (three) times daily.     Marland Kitchen glucose blood (ONE TOUCH ULTRA TEST) test strip 3 times per day 100 each 0  . insulin degludec (TRESIBA FLEXTOUCH) 100 UNIT/ML SOPN FlexTouch Pen Inject 40-80 Units into the skin daily before breakfast.    . Insulin Pen Needle (BD PEN NEEDLE NANO U/F) 32G X 4 MM MISC 1 each by Does not apply route 4 (four) times daily. 150 each 3  . metoCLOPramide (REGLAN) 10 MG tablet Take 1 tablet (10 mg total) by mouth every 8 (eight) hours as needed  for nausea. 30 tablet 0  . nebivolol (BYSTOLIC) 5 MG tablet Take 5 mg by mouth daily.      Marland Kitchen NOVOLIN N RELION 100 UNIT/ML injection Inject 40 Units into the skin 3 (three) times daily before meals. PER SLIDING SCALE    . ondansetron (ZOFRAN) 4 MG tablet Take 4 mg by mouth every 4 (four) hours as needed for nausea or vomiting.    . promethazine (PHENERGAN) 25 MG tablet Take 25 mg by mouth every 6 (six) hours as needed for nausea or vomiting.    . ranitidine (ZANTAC) 75 MG tablet Take 75 mg by mouth at bedtime.    . simvastatin (ZOCOR) 40 MG tablet Take 1 tablet (40 mg total) by mouth every evening. (Patient taking differently: Take 40 mg by mouth at bedtime. ) 30 tablet 5  . sitaGLIPtin (JANUVIA) 100 MG tablet Take 100 mg by mouth every morning.      No current facility-administered medications for this visit.     Physical Exam BP 119/71 (BP Location: Left Arm, Patient Position: Sitting, Cuff Size: Large)   Pulse 60   Resp 16   Ht $R'5\' 5"'LV$  (1.651 m)   Wt 229 lb 12.8 oz (104.2 kg)   SpO2 97% Comment: ON RA  BMI 38.48 kg/m  56 year old woman in  no acute distress Alert and oriented 3 with no focal deficits Incision clean dry and intact  Diagnostic Tests: FINAL DIAGNOSIS Diagnosis 1. Lymph node, biopsy, 4 R - CASEATING GRANULOMATOUS INFLAMMATION. - SEE COMMENT. 2. Lymph node, biopsy, 4 R #2 - CASEATING GRANULOMATOUS INFLAMMATION. - SEE COMMENT. 3. Lymph node, biopsy, 2 R - CASEATING GRANULOMATOUS INFLAMMATION. - SEE COMMENT. Microscopic Comment 1. AFB, GMS, and PAS stains are negative for the presence of microorganisms. Please also see the patient's concurrent microbiology specimens. Enid Cutter MD Pathologist, Electronic Signature (Case signed 04/29/2017) Intraoperative Diagnosis 1. RAPID INTRAOPERATIVE CONSULTATION: 4R NODE: GRANULOMATOUS INFLAMMATION. NO DEFINITIVE LYMPHOID TISSUE IDENTIFIED. (DB) Specimen Gross and Clinical Information Specimen(s) Obtained: 1. Lymph node,  biopsy, 4 R 2. Lymph node, biopsy, 4 R #2 3. Lymph node, biopsy, 2 R 1 of  Impression: 56 year old woman with widespread adenopathy. Biopsies showed granulomatous inflammation. On frozen appears to be noncaseating granulomas but on final path there was some central necrosis. AFB and fungal stains were negative. Cultures are still pending. I suspect this will turn out to be sarcoidosis when all was said and done.  From a surgical standpoint she's doing well. Her activities are unrestricted.  Plan: Follow-up with Dr. Luan Pulling.  I will be happy to see her back again at any time in the future if I can be of any further assistance with her care.  Melrose Nakayama, MD [facility] [phone]

## 2017-05-10 ENCOUNTER — Telehealth: Payer: Self-pay | Admitting: *Deleted

## 2017-05-10 ENCOUNTER — Ambulatory Visit: Payer: Self-pay | Admitting: *Deleted

## 2017-05-10 NOTE — Patient Outreach (Signed)
[facility]) Care Management  05/10/2017  Hannah Schultz 1961-05-09 [id number]    Care coordination  [facility] CM received a voice message from Hannah Schultz at 1215. [facility] CM returned call to her at 1217 today and she request to reschedule the [facility] CM home visit for 05/10/17 at 3 pm to another day next week [facility] CM and Hannah Schultz agreed on next home visit for Friday May 17 2017 at 1 pm  [facility] CM had stopped and spoken with Dr Luan Pulling and staff on 05/09/17 and was informed that Hannah Schultz has an office visit and is now aware of definitive dx of sarcoidosis versus lymphoma but also did express to Dr Luan Pulling that she was "a little depressed" and Dr Luan Pulling provided depression medication.   [facility] CM and Dr Luan Pulling discussed re offering SW services for counseling resources and [facility] CM had already provided education to Hannah Schultz on Lymphoma and sarcoidosis but will answer all further questions Pt may have in future [facility] CM made Hannah Schultz aware that Dr Luan Pulling had been seen and he updated [facility] CM on her dx but did not address depression via telephone.  Hannah Schultz voiced appreciation and interest in seeing [facility] CM on next week Reports she is doing "well" and without issues or voiced concerns on today  Plans [facility] CM to see Hannah Schultz on next week for [facility] CM home visit  [place]. Lavina Hamman, RN, BSN, [place] Care Management [phone]

## 2017-05-13 NOTE — Addendum Note (Signed)
Addendum  created 05/13/17 1528 by Oleta Mouse, MD   Sign clinical note

## 2017-05-17 ENCOUNTER — Other Ambulatory Visit: Payer: Self-pay | Admitting: *Deleted

## 2017-05-19 ENCOUNTER — Encounter: Payer: Self-pay | Admitting: *Deleted

## 2017-05-19 NOTE — Patient Outreach (Addendum)
[facility]) Care Management   05/17/2017  Hannah Schultz 1961/11/10 [id number]  Hannah Schultz is an 56 y.o. female being followed by [facility] CM for care coordination, education related DM management, recent diagnostic tests and procedures (for symptoms of abdominal pain, vomiting, nausea, cough since December 2017, weight loss) and has been seen previously by [facility] for medication assistance. Hannah Schultz has a PMH of DM Type II, hypertension, hyperlipidemia, morbid obesity, GERD, carpal tunnel syndrome, neuropathy, depression, PTSD, sleep apnea, arthritis, bulging disc/pinched nerve and trigger thumb of right hand.   Recent nonspecific chest pain and mediastinal adenopathy indicated recommendation for follow up with pcp for the evaluation of the 01/29/17 abnormal Chest CT imaging that showed "Enlarged mediastinal and bilateral hilar nodes, measuring up to 2.2 cm. Enlarged nodes at the upper abdomen, about the celiac trunk. This could reflect lymphoma, sarcoidosis or a variety of inflammatory conditions. The appearance is less typical for metastatic disease. A number of the mediastinal nodes would be amenable to biopsy, as deemed clinically appropriate." Plus "minimal bibasilar atelectasis noted. Lungs otherwise clear."  Hannah Schultz has been in the ED x 4 in the last 6 months (for nonspecific chest pain on 01/29/17, lower abdominal pain on 04/11/17, near syncope on 04/23/17 after pre op cxr and for cough and vomiting after a mediastinoscopy with a  Biopsy for non caseating granulomas consistent with sarcoidosis on 04/25/17) and an outpatient mediastinal adenopathy biopsy at [facility] with Dr Roxan Hockey  Since [place] last home visit on 04/26/17, she has been seen by her pcp, Dr Luan Pulling and Dr Roxan Hockey, cardiothoracic surgeon and has been started on Prednisone for recent dx of sarcoidosis and Prozac for depression.   Subjective: "I feel good today.  It is my birthday! My family is taking  me out to dinner this evening and then we are leaving for the beach this weekend." " I went to church on Wednesday night" "my right leg gave out"  Objective:   BP 120/80   Pulse (!) 55   Temp (!) 95.4 F (35.2 C) (Oral)   Resp 20   Ht 1.651 m (_0 )   Wt 222 lb (100.7 kg)   LMP  (Exact Date)   SpO2 98%   BMI 36.94 kg/m   Review of Systems  Constitutional: Positive for weight loss. Negative for chills, diaphoresis, fever and malaise/fatigue.  HENT: Negative for congestion, ear discharge, ear pain, hearing loss, nosebleeds, sinus pain, sore throat and tinnitus.   Eyes: Negative.  Negative for blurred vision, double vision, photophobia, pain, discharge and redness.  Respiratory: Negative.  Negative for cough, hemoptysis, sputum production, shortness of breath, wheezing and stridor.   Cardiovascular: Negative.  Negative for chest pain, palpitations, orthopnea, claudication, leg swelling and PND.  Gastrointestinal: Negative for abdominal pain, blood in stool, constipation, diarrhea, heartburn, melena, nausea and vomiting.       Last bm 05/16/17  Genitourinary: Negative.  Negative for dysuria, flank pain, frequency, hematuria and urgency.  Musculoskeletal: Positive for back pain and joint pain. Negative for falls, myalgias and neck pain.       Reports a MRI was done by her provider to find a "pinch nerve" recently; "My right leg gave out while walking down the hallway behind my husband the other day but I caught on to him and did not fall"  Skin: Negative for itching and rash.  Neurological: Negative.  Negative for dizziness, tingling, tremors, sensory change, speech change, focal weakness, seizures, loss of  consciousness, weakness and headaches.  Endo/Heme/Allergies: Negative.  Negative for environmental allergies and polydipsia. Does not bruise/bleed easily.  Psychiatric/Behavioral: Positive for depression. Negative for hallucinations, memory loss, substance abuse and suicidal ideas. The  patient is not nervous/anxious and does not have insomnia.     Physical Exam  Constitutional: She is oriented to person, place, and time. She appears well-developed and well-nourished.  HENT:  Head: Normocephalic and atraumatic.  Right Ear: External ear normal.  Left Ear: External ear normal.  Nose: Nose normal.  Mouth/Throat: Oropharynx is clear and moist.  Eyes: Conjunctivae are normal. Pupils are equal, round, and reactive to light.  Neck: Normal range of motion. Neck supple.  Cardiovascular: Normal rate, regular rhythm, normal heart sounds and intact distal pulses.   Respiratory: Effort normal and breath sounds normal.  GI: Soft. Bowel sounds are normal.  Musculoskeletal: Normal range of motion.  Neurological: She is alert and oriented to person, place, and time.  Skin: Skin is warm and dry.  Psychiatric: She has a normal mood and affect. Her behavior is normal. Judgment and thought content normal.    Encounter Medications:   Outpatient Encounter Prescriptions as of 05/17/2017  Medication Sig Note  . Cholecalciferol (VITAMIN D3) 5000 UNITS TABS Take 5,000 Units by mouth daily.    Marland Kitchen FLUoxetine (PROZAC) 20 MG tablet Take 20 mg by mouth daily.   Marland Kitchen gabapentin (NEURONTIN) 300 MG capsule Take 600 mg by mouth 3 (three) times daily.    Marland Kitchen glucose blood (ONE TOUCH ULTRA TEST) test strip 3 times per day   . HYDROMET 5-1.5 MG/5ML syrup TK 5 MLS PO Q 6 H PRF COUGH   . insulin degludec (TRESIBA FLEXTOUCH) 100 UNIT/ML SOPN FlexTouch Pen Inject 40-80 Units into the skin daily before breakfast. 04/23/2017: Dosage was verified by the patient. She stated that she uses Tresiba 100 units/ml, NOT Tresiba 200 units/ml!!  . Insulin Pen Needle (BD PEN NEEDLE NANO U/F) 32G X 4 MM MISC 1 each by Does not apply route 4 (four) times daily.   . metoCLOPramide (REGLAN) 10 MG tablet Take 1 tablet (10 mg total) by mouth every 8 (eight) hours as needed for nausea.   . nebivolol (BYSTOLIC) 5 MG tablet Take 5 mg by  mouth daily.     Marland Kitchen NOVOLIN N RELION 100 UNIT/ML injection Inject 40 Units into the skin 3 (three) times daily before meals. PER SLIDING SCALE 04/23/2017: Dosage verified by the patient  . ondansetron (ZOFRAN) 4 MG tablet Take 4 mg by mouth every 4 (four) hours as needed for nausea or vomiting.   . predniSONE (DELTASONE) 10 MG tablet    . promethazine (PHENERGAN) 25 MG tablet Take 25 mg by mouth every 6 (six) hours as needed for nausea or vomiting.   . ranitidine (ZANTAC) 75 MG tablet Take 75 mg by mouth at bedtime.   . simvastatin (ZOCOR) 40 MG tablet Take 1 tablet (40 mg total) by mouth every evening. (Patient taking differently: Take 40 mg by mouth at bedtime. )   . sitaGLIPtin (JANUVIA) 100 MG tablet Take 100 mg by mouth every morning.    . triamcinolone cream (KENALOG) 0.5 %     No facility-administered encounter medications on file as of 05/17/2017.     Functional Status:   In your present state of health, do you have any difficulty performing the following activities: 05/17/2017 04/23/2017  Hearing? N N  Vision? N N  Difficulty concentrating or making decisions? N N  Walking or climbing  stairs? Y Y  Dressing or bathing? N N  Doing errands, shopping? N N  Preparing Food and eating ? N -  Using the Toilet? N -  In the past six months, have you accidently leaked urine? N -  Do you have problems with loss of bowel control? N -  Managing your Medications? N -  Managing your Finances? N -  Housekeeping or managing your Housekeeping? N -  Some recent data might be hidden    Fall/Depression Screening:    Fall Risk  05/17/2017 03/30/2017 10/30/2016  Falls in the past year? No No No  Risk for fall due to : Impaired balance/gait;Impaired vision;Medication side effect - -   PHQ 2/9 Scores 05/17/2017 03/18/2017 10/30/2016 10/23/2016 09/04/2016  PHQ - 2 Score _0 0 1  PHQ- 9 Score _1 - -    Assessment:    Presents today smiling when she opened her back door for [facility] CM.  She is dressed with  makeup on and is listening to music in preparation to go out to dinner with her family.   She ws greeted for her birthday.  She reports she went back to church on Wednesday night (after she had taken a break from church events since illnesses beginning in December 2017).    Acute Medical Conditions: Back pain with pinch nerve found by her provider after her complaints of back pain after a 1+ drive to a visiting church last week.  She has used her cane to walk at intervals but is noted not using cane today [facility] CM recommended back support and cushion to use for long traveling events; encouraged stopping to stretch every 1-2 hours   Chronic Medical Conditions (DM, HTN, Sarcoidosis) Being managed with DM/HTN medications and prednisone. She is pleased with her weight loss from 270 to 222 lbs per her scales recently and is wanting to lose more weight but has not decided on a weight goal at this time. BMI presently is 36.90 -obesity (BMI recommended per BMI scale is 149 for ht 5'5")  Reports one recent cbg low of 55 and [facility] CM spoke with her about incorporating a HS snack and provided protein hs snack examples. [facility] CM previously reviewed Sarcoidosis dx, and standard treatment plan. Hannah Schultz still have and able to review handouts for sarcoidosis. Dr Luan Pulling has referred Hannah Schultz to GI on 06/10/17 at 0830   Plan: follow up 1- 2 weeks for home visit [facility] to route note to pcp Luan Pulling) and other care team providers [facility] assisted Hannah Schultz by contacting the HTA concierge to obtain her ID # ([id number]) for [employer] to show to the staff when she visits the local [place] CM Care Plan Problem One     Most Recent Value  Care Plan Problem One  Knowledge Deficit related to self health management of Diabetes Mellitus, Type 2, sarcoidosis  Role Documenting the Problem One  Care Management Coordinator  Care Plan for Problem One  Active  [facility] Long Term Goal   Over the next 60 days, patient will verbalize  understanding of changes to diet, medication, and exercise regimen as recommended by her endocrinologist and CDE/RD  [facility] Long Term Goal Start Date  05/17/17  Interventions for Problem One Long Term Goal  Utilizing teachback method, discussed at length most recent changes to DIabetes/sarcoidosis treatment plan changes  [facility] CM Short Term Goal #1   Over the next 7 days,  patient will impletment medication  dose change as prescribed and will notify provider with any side effects or cbg findings outside established parameters  [facility] CM Short Term Goal #1 Start Date  01/25/17  [facility] CM Short Term Goal #1 Met Date  02/05/17  Interventions for Short Term Goal #1  medications reviewed w/ changes to insulin dose  [facility] CM Short Term Goal #2   Over the next 30 days patient will verbaliize understanding of possible weight reduction surgery as strategy for management of DM  [facility] CM Short Term Goal #2 Start Date  01/25/17  [facility] CM Short Term Goal #2 Met Date  02/19/17  Interventions for Short Term Goal #2  discussed with patient by phone recommendations as made by PCP/PULM?ENDO,  scheudled visit with patient and plan for education re: weight loss surgery, EMMI article, wt loss surgery book for pt to review  [facility] CM Short Term Goal #3  over the next 30 days patient will start going to [facility] silver fit program  [facility] CM Short Term Goal #3 Start Date  05/17/17  Interventions for Short Tern Goal #3  assist with contact to HTA concierge to get [employer] fit id # for local [facility], provide meal plans for DM    [facility] CM Care Plan Problem Two     Most Recent Value  Care Plan Problem Two  acute health condition-chest pain requiring ED visit  Role Documenting the Problem Two  Care Management [place] for Problem Two  Active  Interventions for Problem Two Long Term Goal   brief pt examination in home recommended and facilitated ED visit  [facility] Long Term Goal  over the next 45 days pt will verbalize understanding of plan of care  for management of acute chest pain, leg swelling and GI symptoms  [facility] Long Term Goal Start Date  01/29/17  [facility] Long Term Goal Met Date  03/28/17  [facility] CM Short Term Goal #1   Over the next 7 days, patient will see pcp/pulmonary provider  as recommended  [facility] CM Short Term Goal #1 Start Date  01/30/17  [facility] CM Short Term Goal #1 Met Date   02/12/17  Interventions for Short Term Goal #2   assisted patient with pcp/pulmonary provider appointment scheduling,  update not to PCP    [facility] CM Care Plan Problem Three     Most Recent Value  Care Plan Problem Three  Acute Health Condition (peri-umbilical pain and early satiety)  Role Documenting the Problem Three  Care Management Coordinator  Care Plan for Problem Three  Active  [facility] Long Term Goal   Over the next 45 days, patient will verbalize understsanding of plan of care for acute health condition  [facility] Long Term Goal Start Date  02/19/17  [facility] Long Term Goal Met Date  03/28/17  Interventions for Problem Three Long Term Goal  Discussed new symptoms and forwarded assessment information to PCP  [facility] CM Short Term Goal #1   over the next 30 days the patine will have a PET sccan and be scheduled for a biopsy to assist with definitive medical diagnosis  [facility] CM Short Term Goal #1 Start Date  03/28/17  [facility] CM Short Term Goal #1 Met Date  04/26/17  Interventions for Short Term Goal #1  reveiw mediastinoscopy,PET scan, sarcoidosis, lymphoma in detaiil, answer questoin ans re educate as needed        [employer] Lavina Hamman, RN, BSN, CCM [facility] Care Management [phone]  Plan:

## 2017-05-23 LAB — FUNGUS CULTURE WITH STAIN

## 2017-05-23 LAB — FUNGUS CULTURE RESULT

## 2017-05-23 LAB — FUNGAL ORGANISM REFLEX

## 2017-05-27 DIAGNOSIS — F332 Major depressive disorder, recurrent severe without psychotic features: Secondary | ICD-10-CM | POA: Diagnosis not present

## 2017-05-31 ENCOUNTER — Other Ambulatory Visit: Payer: Self-pay | Admitting: *Deleted

## 2017-05-31 NOTE — Patient Outreach (Signed)
[facility]) Care Management   06/03/2017  Hannah Schultz 12/19/1960 [id number]  Hannah Schultz is an 56 y.o. female being followed by [facility] CM for care coordination and education related DM management, and recent present diagnostic tests and procedures (abdominal pain, vomiting, nausea, cough since December 2017) and has been seen previously by [place] for medication assistance. Hannah Schultz has a PMH of DM Type II, hypertension, hyperlipidemia, morbid obesity, GERD, carpal tunnel syndrome, neuropathy, depression, PTSD, sleep apnea, arthritis and trigger thumb of right hand.   Recent nonspecific chest pain and mediastinal adenopathy indicated recommendation for follow up with pcp for the evaluation of the 01/29/17 abnormal Chest CT imaging that showed "Enlarged mediastinal and bilateral hilar nodes, measuring up to 2.2 cm. Enlarged nodes at the upper abdomen, about the celiac trunk. This could reflect lymphoma, sarcoidosis or a variety of inflammatory conditions. The appearance is less typical for metastatic disease. A number of the mediastinal nodes would be amenable to biopsy, as deemed clinically appropriate." Plus "minimal bibasilar atelectasis noted. Lungs otherwise clear."  Since this [facility] CM last home visit with Hannah Schultz, she has been in the ED x 4 (non specific chest pain on 01/29/17, for lower abdominal pain-04/11/17, near syncope 04/23/17 after pre op cxr and cough and vomiting after a mediastinoscopy with a  Biopsy for non caseating granulomas consistent with sarcoidosis  04/25/17) and an outpatient mediastinal adenopathy biopsy at [facility] with Dr Roxan Hockey    Subjective:  " I feel better" "I have been to the [facility] to walk on the treadmill"  Objective:   BP 110/68   Pulse 66   Temp 97 F (36.1 C) (Oral)   Resp 20   Ht 1.651 m (_0 )   Wt 233 lb (105.7 kg)   SpO2 98%   BMI 38.77 kg/m  Review of Systems  Constitutional: Negative for chills, diaphoresis,  fever, malaise/fatigue and weight loss.  HENT: Negative.  Negative for congestion, ear discharge, ear pain, hearing loss, nosebleeds, sinus pain, sore throat and tinnitus.   Eyes: Positive for blurred vision. Negative for double vision, photophobia, pain, discharge and redness.  Respiratory: Negative.  Negative for cough, hemoptysis, sputum production, shortness of breath, wheezing and stridor.   Cardiovascular: Positive for chest pain. Negative for palpitations, orthopnea, claudication, leg swelling and PND.  Gastrointestinal: Positive for abdominal pain. Negative for blood in stool, constipation, diarrhea, heartburn, melena, nausea and vomiting.       Last bm 05/31/17 am   Genitourinary: Negative.  Negative for dysuria, flank pain, frequency, hematuria and urgency.  Musculoskeletal: Positive for back pain. Negative for falls, joint pain, myalgias and neck pain.  Skin: Negative.  Negative for itching and rash.  Neurological: Positive for dizziness, focal weakness and weakness. Negative for tingling, tremors, sensory change, speech change, seizures, loss of consciousness and headaches.       Weakness of ower extremities - "pinched nerve"  Endo/Heme/Allergies: Negative.  Negative for environmental allergies and polydipsia. Does not bruise/bleed easily.  Psychiatric/Behavioral: Positive for depression. Negative for hallucinations, memory loss, substance abuse and suicidal ideas. The patient is not nervous/anxious and does not have insomnia.        Now being [facility] MD[facility]    Physical Exam  Constitutional: She is oriented to person, place, and time. She appears well-developed and well-nourished.  HENT:  Head: Normocephalic and atraumatic.  Eyes: Conjunctivae are normal. Pupils are equal, round, and reactive to light.  Neck: Normal range of motion.  Neck supple.  Cardiovascular: Normal rate, regular rhythm, normal heart sounds and intact distal pulses.   Respiratory: Effort normal and  breath sounds normal.  GI: Soft. Bowel sounds are normal.  Musculoskeletal: Normal range of motion.  Neurological: She is alert and oriented to person, place, and time.  Skin: Skin is warm and dry.  Psychiatric: She has a normal mood and affect. Her behavior is normal. Judgment and thought content normal.    Encounter Medications:   Outpatient Encounter Prescriptions as of 05/31/2017  Medication Sig Note  . Cholecalciferol (VITAMIN D3) 5000 UNITS TABS Take 5,000 Units by mouth daily.    Marland Kitchen FLUoxetine (PROZAC) 20 MG tablet Take 20 mg by mouth daily.   Marland Kitchen gabapentin (NEURONTIN) 300 MG capsule Take 600 mg by mouth 3 (three) times daily.    Marland Kitchen glucose blood (ONE TOUCH ULTRA TEST) test strip 3 times per day   . HYDROMET 5-1.5 MG/5ML syrup TK 5 MLS PO Q 6 H PRF COUGH   . insulin degludec (TRESIBA FLEXTOUCH) 100 UNIT/ML SOPN FlexTouch Pen Inject 40-80 Units into the skin daily before breakfast. 04/23/2017: Dosage was verified by the patient. She stated that she uses Tresiba 100 units/ml, NOT Tresiba 200 units/ml!!  . Insulin Pen Needle (BD PEN NEEDLE NANO U/F) 32G X 4 MM MISC 1 each by Does not apply route 4 (four) times daily.   . metoCLOPramide (REGLAN) 10 MG tablet Take 1 tablet (10 mg total) by mouth every 8 (eight) hours as needed for nausea.   . nebivolol (BYSTOLIC) 5 MG tablet Take 5 mg by mouth daily.     Marland Kitchen NOVOLIN N RELION 100 UNIT/ML injection Inject 40 Units into the skin 3 (three) times daily before meals. PER SLIDING SCALE 04/23/2017: Dosage verified by the patient  . ondansetron (ZOFRAN) 4 MG tablet Take 4 mg by mouth every 4 (four) hours as needed for nausea or vomiting.   . predniSONE (DELTASONE) 10 MG tablet    . promethazine (PHENERGAN) 25 MG tablet Take 25 mg by mouth every 6 (six) hours as needed for nausea or vomiting.   . ranitidine (ZANTAC) 75 MG tablet Take 75 mg by mouth at bedtime.   . simvastatin (ZOCOR) 40 MG tablet Take 1 tablet (40 mg total) by mouth every evening. (Patient  taking differently: Take 40 mg by mouth at bedtime. )   . sitaGLIPtin (JANUVIA) 100 MG tablet Take 100 mg by mouth every morning.    . triamcinolone cream (KENALOG) 0.5 %     No facility-administered encounter medications on file as of 05/31/2017.     Functional Status:   In your present state of health, do you have any difficulty performing the following activities: 05/31/2017 05/17/2017  Hearing? N N  Vision? N N  Difficulty concentrating or making decisions? N N  Walking or climbing stairs? Y Y  Dressing or bathing? N N  Doing errands, shopping? N N  Preparing Food and eating ? N N  Using the Toilet? N N  In the past six months, have you accidently leaked urine? N N  Do you have problems with loss of bowel control? N N  Managing your Medications? N N  Managing your Finances? N N  Housekeeping or managing your Housekeeping? N N  Some recent data might be hidden    Fall/Depression Screening:    Fall Risk  05/17/2017 03/30/2017 10/30/2016  Falls in the past year? No No No  Risk for fall due to : Impaired balance/gait;Impaired  vision;Medication side effect - -   PHQ 2/9 Scores 05/17/2017 03/18/2017 10/30/2016 10/23/2016 09/04/2016  PHQ - 2 Score _0 0 1  PHQ- 9 Score _1 - -    Assessment:   Hannah Ramdass presents at her home opening her back door for [facility] CM while using a straight cane to ambulate but her gait is steady.  She is dressed with make up on and smiling as she welcomes [facility] CM to her home.  Acute concerns mentioned during home visit (right leg weakness)  Hannah Thackston reports right leg weakness She prefers not to have mychart services because she reports she dropped her new phone in commode recently  using cane today related to right leg "giving out" having pain in chest nothing causing resolve with rest  Prozac & trazodone  need new glasses Has mark shapiro in [place] [address] dr Linna Hoff and vision works that is found to be in network on her [employer] and [employer]  plans Provided contact numbers and address to assist her to make future appointments Answered all questions  Chronic medical concerns (DM, HTN, HDL, sarcoidosis, Depression) Hannah Burck reports that her use of prednisone has caused her to pick up "three to five pounds" She was seen on 05/27/17 at [facility] for MDD and PTSD During that office visit she had a weight of 233 lbs at 5'5" with a BMI of 38.76 and a BP at that time of 136/78  Concern voiced about pending culture results from Dr Roxan Hockey but result is still pending.  Cm answered questions about this pending result.    Social Mr and Hannah Dobesh reports she will be going on day cruise 06/01/17 and she has been to the [facility] with her girlfriend recently and plans on going 05/31/17 to walk on the treadmill  Hannah Braddock allowed to ventilate her feelings about recent visits from a friend that causes her concern  Plan:  To see Hannah Varon in 1-2 weeks for follow up home visit  [facility] CM note routed to pcp, GI and endocrinologist She is scheduled to be seen by Dr Luan Pulling pcp on 06/03/17 and GI on 06/10/17  She is schedule to return to [facility] on Monday 10/14/17 1 pm    [facility] CM Care Plan Problem One     Most Recent Value  Care Plan Problem One  Knowledge Deficit related to self health management of Diabetes Mellitus, Type 2, sarcoidosis  Role Documenting the Problem One  Care Management [place] for Problem One  Active  THN Long Term Goal   Over the next 60 days, patient will verbalize understanding of changes to diet, medication, and exercise regimen as recommended by her endocrinologist and CDE/RD  [facility] Long Term Goal Start Date  05/17/17  Interventions for Problem One Long Term Goal  Utilizing teachback method, discussed at length most recent changes to DIabetes/sarcoidosis treatment plan changes  THN CM Short Term Goal #1   Over the next 7 days,  patient will impletment medication dose change as prescribed and will notify provider with any  side effects or cbg findings outside established parameters  THN CM Short Term Goal #1 Start Date  01/25/17  [facility] CM Short Term Goal #1 Met Date  02/05/17  Interventions for Short Term Goal #1  medications reviewed w/ changes to insulin dose  THN CM Short Term Goal #2   Over the next 30 days patient will verbaliize understanding of possible weight reduction surgery as strategy for  management of DM  THN CM Short Term Goal #2 Start Date  01/25/17  [facility] CM Short Term Goal #2 Met Date  02/19/17  Interventions for Short Term Goal #2  discussed with patient by phone recommendations as made by PCP/PULM?ENDO,  scheudled visit with patient and plan for education re: weight loss surgery, EMMI article, wt loss surgery book for pt to review  THN CM Short Term Goal #3  over the next 30 days patient will start going [facility]  THN CM Short Term Goal #3 Start Date  05/17/17  [facility] CM Short Term Goal #3 Met Date  05/31/17  Interventions for Short Tern Goal #3  assist with contact to HTA concierge to get silver fit id # for local [employer], provide meal plans for DM  THN CM Short Term Goal #4  over the next 30 days patient will be able to have a completed eye exam completed   THN CM Short Term Goal #4 Start Date  05/31/17  Interventions for Short Term Goal #4  assisted with finding local ophthamalogist in network with HTA, review standards of eye care for Dm patients, answer all questions     [facility] CM Care Plan Problem Two     Most Recent Value  Care Plan Problem Two  acute health condition-chest pain requiring ED visit  Role Documenting the Problem Two  Care Management Coordinator  Care Plan for Problem Two  Active  Interventions for Problem Two Long Term Goal   re established goal on 05/31/17, RN home visit exam, pt with upcoming GI appt, and note to pcp about reports of chest pain at intervals  [facility] Long Term Goal  over the next 45 days pt will verbalize understanding of plan of care for management of  acute chest pain, leg swelling and GI symptoms  THN Long Term Goal Start Date  05/31/17  THN CM Short Term Goal #1   Over the next 7 days, patient will see pcp/pulmonary provider  as recommended  THN CM Short Term Goal #1 Start Date  01/30/17  [facility] CM Short Term Goal #1 Met Date   02/12/17  Interventions for Short Term Goal #2   assisted patient with pcp/pulmonary provider appointment scheduling,  update not to PCP  [facility] CM Short Term Goal #2   over the next 30 days pt will be seen by pcp and GI providers for chest pain and GI symptoms reported and understand home management for eaach  [facility] CM Short Term Goal #2 Start Date  05/31/17  Interventions for Short Term Goal #2  RN home exam, review symptoms, report symptoms to pcp/GI provider, educate patient on home care for each condition, answer patient questions    [facility] CM Care Plan Problem Three     Most Recent Value  Care Plan Problem Three  Acute Health Condition (peri-umbilical pain and early satiety)  Role Documenting the Problem Three  Care Management Coordinator  Care Plan for Problem Three  Active  THN Long Term Goal   Over the next 45 days, patient will verbalize understsanding of plan of care for acute health condition  THN Long Term Goal Start Date  02/19/17  [facility] Long Term Goal Met Date  03/28/17  Interventions for Problem Three Long Term Goal  Discussed new symptoms and forwarded assessment information to PCP  [facility] CM Short Term Goal #1   over the next 30 days the patine will have a PET sccan and be scheduled for  a biopsy to assist with definitive medical diagnosis  THN CM Short Term Goal #1 Start Date  03/28/17  [facility] CM Short Term Goal #1 Met Date  04/26/17  Interventions for Short Term Goal #1  reveiw mediastinoscopy,PET scan, sarcoidosis, lymphoma in detaiil, answer questoin ans re educate as needed      Dadrian Ballantine [name], RN, BSN, [place] Care Management [phone]

## 2017-06-03 ENCOUNTER — Encounter: Payer: Self-pay | Admitting: *Deleted

## 2017-06-03 DIAGNOSIS — D860 Sarcoidosis of lung: Secondary | ICD-10-CM | POA: Diagnosis not present

## 2017-06-03 DIAGNOSIS — E1140 Type 2 diabetes mellitus with diabetic neuropathy, unspecified: Secondary | ICD-10-CM | POA: Diagnosis not present

## 2017-06-03 DIAGNOSIS — I10 Essential (primary) hypertension: Secondary | ICD-10-CM | POA: Diagnosis not present

## 2017-06-07 ENCOUNTER — Telehealth: Payer: Self-pay | Admitting: *Deleted

## 2017-06-07 LAB — ACID FAST CULTURE WITH REFLEXED SENSITIVITIES (MYCOBACTERIA): Acid Fast Culture: NEGATIVE

## 2017-06-07 LAB — ACID FAST CULTURE WITH REFLEXED SENSITIVITIES

## 2017-06-07 NOTE — Patient Outreach (Signed)
[facility]) Care Management  06/07/2017  Hannah Schultz 1961-03-10 [id number]   Care coordination  [facility] CM follow up with pt Saw her and her husband in [employer] and they are doing well Pt smiling and joking No needs or concerns voiced THN Cm attempted to call Mrs Fullenwider and Paulo Fruit (husband) later in day to answer a question. Left Messages for both on their respective cell phones  Plans  Mrs Loscalzo to be seen next week for home visit follow up   [place]. Lavina Hamman RN, BSN, CCM [facility] Care Management [phone] '

## 2017-06-10 ENCOUNTER — Ambulatory Visit: Payer: PPO | Admitting: Gastroenterology

## 2017-06-14 ENCOUNTER — Other Ambulatory Visit: Payer: Self-pay | Admitting: *Deleted

## 2017-06-14 NOTE — Patient Outreach (Signed)
[place] [facility]) Care Management   06/14/2017  Hannah Schultz 1961-06-09 [id number]  Hannah Schultz is an 56 y.o. female  being followed by [facility] CM for care coordination and education related DM management, and recent present diagnostic tests and procedures (abdominal pain, vomiting, nausea, cough since December 2017) and has been seen previously by [facility] for medication assistance. Hannah Schultz has a PMH of DM Type II, hypertension, hyperlipidemia, morbid obesity, GERD, carpal tunnel syndrome, neuropathy, depression, PTSD, sleep apnea, arthritis and trigger thumb of right hand.   Recent nonspecific chest pain and mediastinal adenopathy indicated recommendation for follow up with pcp for the evaluation of the 01/29/17 abnormal Chest CT imaging that showed "Enlarged mediastinal and bilateral hilar nodes, measuring up to 2.2 cm. Enlarged nodes at the upper abdomen, about the celiac trunk. This could reflect lymphoma, sarcoidosis or a variety of inflammatory conditions. The appearance is less typical for metastatic disease. A number of the mediastinal nodes would be amenable to biopsy, as deemed clinically appropriate." Plus "minimal bibasilar atelectasis noted. Lungs otherwise clear."  Since this [facility] CM last home visit with Hannah Schultz, she has been in the ED x 4 (non specific chest pain on 01/29/17, for lower abdominal pain-04/11/17, near syncope 04/23/17 after pre op cxr and cough and vomiting after a mediastinoscopy with a Biopsy for non caseating granulomas consistent with sarcoidosis 04/25/17) and an outpatient mediastinal adenopathy biopsy at [facility] with Dr Roxan Hockey  This is the ninth [facility] CM home visit  Subjective:  "I'm doing good except my legs still give out"  Objective:   BP 120/78   Pulse 68   Temp (!) 97 F (36.1 C) (Oral)   Resp 20   SpO2 97%   Review of Systems  Constitutional: Negative for chills, diaphoresis, fever, malaise/fatigue and weight loss.  HENT:  Negative for congestion, ear discharge, ear pain, hearing loss, nosebleeds, sinus pain, sore throat and tinnitus.   Eyes: Negative.   Respiratory: Negative.  Negative for stridor.   Cardiovascular: Negative.   Gastrointestinal: Negative.   Genitourinary: Negative.   Musculoskeletal: Positive for joint pain.  Skin: Negative for itching and rash.  Neurological: Negative.  Negative for weakness.  Endo/Heme/Allergies: Negative.   Psychiatric/Behavioral: Negative.     Physical Exam  Constitutional: She is oriented to person, place, and time. She appears well-developed and well-nourished.  HENT:  Head: Normocephalic and atraumatic.  Eyes: Pupils are equal, round, and reactive to light. Conjunctivae are normal.  Neck: Normal range of motion. Neck supple.  Cardiovascular: Normal rate, regular rhythm, normal heart sounds and intact distal pulses.   Respiratory: Effort normal.  GI: Soft. Bowel sounds are normal.  Musculoskeletal: Normal range of motion.  Neurological: She is alert and oriented to person, place, and time.  Skin: Skin is warm and dry.  Psychiatric: She has a normal mood and affect. Her behavior is normal. Judgment and thought content normal.    Encounter Medications:   Outpatient Encounter Prescriptions as of 06/14/2017  Medication Sig Note  . Cholecalciferol (VITAMIN D3) 5000 UNITS TABS Take 5,000 Units by mouth daily.    Marland Kitchen FLUoxetine (PROZAC) 20 MG tablet Take 20 mg by mouth daily.   Marland Kitchen gabapentin (NEURONTIN) 300 MG capsule Take 600 mg by mouth 3 (three) times daily.    Marland Kitchen glucose blood (ONE TOUCH ULTRA TEST) test strip 3 times per day   . HYDROMET 5-1.5 MG/5ML syrup TK 5 MLS PO Q 6 H PRF COUGH   . insulin  degludec (TRESIBA FLEXTOUCH) 100 UNIT/ML SOPN FlexTouch Pen Inject 40-80 Units into the skin daily before breakfast. 04/23/2017: Dosage was verified by the patient. She stated that she uses Tresiba 100 units/ml, NOT Tresiba 200 units/ml!!  . Insulin Pen Needle (BD PEN NEEDLE  NANO U/F) 32G X 4 MM MISC 1 each by Does not apply route 4 (four) times daily.   . metoCLOPramide (REGLAN) 10 MG tablet Take 1 tablet (10 mg total) by mouth every 8 (eight) hours as needed for nausea.   . nebivolol (BYSTOLIC) 5 MG tablet Take 5 mg by mouth daily.     Marland Kitchen NOVOLIN N RELION 100 UNIT/ML injection Inject 40 Units into the skin 3 (three) times daily before meals. PER SLIDING SCALE 04/23/2017: Dosage verified by the patient  . ondansetron (ZOFRAN) 4 MG tablet Take 4 mg by mouth every 4 (four) hours as needed for nausea or vomiting.   . predniSONE (DELTASONE) 10 MG tablet    . promethazine (PHENERGAN) 25 MG tablet Take 25 mg by mouth every 6 (six) hours as needed for nausea or vomiting.   . ranitidine (ZANTAC) 75 MG tablet Take 75 mg by mouth at bedtime.   . simvastatin (ZOCOR) 40 MG tablet Take 1 tablet (40 mg total) by mouth every evening. (Patient taking differently: Take 40 mg by mouth at bedtime. )   . sitaGLIPtin (JANUVIA) 100 MG tablet Take 100 mg by mouth every morning.    . triamcinolone cream (KENALOG) 0.5 %     No facility-administered encounter medications on file as of 06/14/2017.     Functional Status:   In your present state of health, do you have any difficulty performing the following activities: 05/31/2017 05/17/2017  Hearing? N N  Vision? N N  Difficulty concentrating or making decisions? N N  Walking or climbing stairs? Y Y  Dressing or bathing? N N  Doing errands, shopping? N N  Preparing Food and eating ? N N  Using the Toilet? N N  In the past six months, have you accidently leaked urine? N N  Do you have problems with loss of bowel control? N N  Managing your Medications? N N  Managing your Finances? N N  Housekeeping or managing your Housekeeping? N N  Some recent data might be hidden    Fall/Depression Screening:    Fall Risk  05/17/2017 03/30/2017 10/30/2016  Falls in the past year? No No No  Risk for fall due to : Impaired balance/gait;Impaired  vision;Medication side effect - -   PHQ 2/9 Scores 05/17/2017 03/18/2017 10/30/2016 10/23/2016 09/04/2016  PHQ - 2 Score _0 0 1  PHQ- 9 Score _1 - -    Assessment:   During this home visit Mr and Hannah Kachmar were present  Hannah Coca main medical concerns during this visit includes having weakness of her lower extremities at intervals She is still using her cane Dr Luan Pulling seen on Monday, June 10 2017 versus Dr Charlestine Massed, GI Dr Luan Pulling to start steroid shots per Hannah Arisbeth Purrington to go to the [facility] for exercise  Plan:  Assisted in getting Husband in getting Silver fit ID [id number]  Will see Hannah Charlot in 4 weeks for home visit [facility] CM Care Plan Problem One     Most Recent Value  Care Plan Problem One  Knowledge Deficit related to self health management of Diabetes Mellitus, Type 2, sarcoidosis  Role Documenting the Problem One  Care Management [place] for  Problem One  Active  THN Long Term Goal   Over the next 60 days, patient will verbalize understanding of changes to diet, medication, and exercise regimen as recommended by her endocrinologist and CDE/RD  [facility] Long Term Goal Start Date  05/17/17  Interventions for Problem One Long Term Goal  Utilizing teachback method, discussed at length most recent changes to DIabetes/sarcoidosis treatment plan changes  THN CM Short Term Goal #1   Over the next 7 days,  patient will impletment medication dose change as prescribed and will notify provider with any side effects or cbg findings outside established parameters  THN CM Short Term Goal #1 Start Date  01/25/17  [facility] CM Short Term Goal #1 Met Date  02/05/17  Interventions for Short Term Goal #1  medications reviewed w/ changes to insulin dose  THN CM Short Term Goal #2   Over the next 30 days patient will verbaliize understanding of possible weight reduction surgery as strategy for management of DM  THN CM Short Term Goal #2 Start Date  01/25/17  [facility] CM Short Term Goal #2 Met Date   02/19/17  Interventions for Short Term Goal #2  discussed with patient by phone recommendations as made by PCP/PULM?ENDO,  scheudled visit with patient and plan for education re: weight loss surgery, EMMI article, wt loss surgery book for pt to review  THN CM Short Term Goal #3  over the next 30 days patient will start going [facility] silver fit program  THN CM Short Term Goal #3 Start Date  05/17/17  [facility] CM Short Term Goal #3 Met Date  05/31/17  Interventions for Short Tern Goal #3  assist with contact to [employer] concierge to get silver fit id # for local [facility], provide meal plans for DM  THN CM Short Term Goal #4  over the next 30 days patient will be able to have a completed eye exam completed   THN CM Short Term Goal #4 Start Date  05/31/17  Interventions for Short Term Goal #4  assisted with finding local ophthamalogist in network with [employer], review standards of eye care for Dm patients, answer all questions     [facility] CM Care Plan Problem Two     Most Recent Value  Care Plan Problem Two  acute health condition-chest pain requiring ED visit  Role Documenting the Problem Two  Care Management Coordinator  Care Plan for Problem Two  Active  Interventions for Problem Two Long Term Goal   re established goal on 05/31/17, RN home visit exam, pt with upcoming GI appt, and note to pcp about reports of chest pain at intervals  [facility] Long Term Goal  over the next 45 days pt will verbalize understanding of plan of care for management of acute chest pain, leg swelling and GI symptoms  THN Long Term Goal Start Date  05/31/17  THN CM Short Term Goal #1   Over the next 7 days, patient will see pcp/pulmonary provider  as recommended  THN CM Short Term Goal #1 Start Date  01/30/17  [facility] CM Short Term Goal #1 Met Date   02/12/17  Interventions for Short Term Goal #2   assisted patient with pcp/pulmonary provider appointment scheduling,  update not to PCP  [facility] CM Short Term Goal #2   over the next 30 days pt will be seen  by pcp and GI providers for chest pain and GI symptoms reported and understand home management for eaach  [facility] CM Short Term  Goal #2 Start Date  05/31/17  Interventions for Short Term Goal #2  RN home exam, review symptoms, report symptoms to pcp/GI provider, educate patient on home care for each condition, answer patient questions    [facility] CM Care Plan Problem Three     Most Recent Value  Care Plan Problem Three  Acute Health Condition (peri-umbilical pain and early satiety)  Role Documenting the Problem Three  Care Management [place] for Problem Three  Active  THN Long Term Goal   Over the next 45 days, patient will verbalize understsanding of plan of care for acute health condition  THN Long Term Goal Start Date  02/19/17  [facility] Long Term Goal Met Date  03/28/17  Interventions for Problem Three Long Term Goal  Discussed new symptoms and forwarded assessment information to PCP  [facility] CM Short Term Goal #1   over the next 30 days the patine will have a PET sccan and be scheduled for a biopsy to assist with definitive medical diagnosis  THN CM Short Term Goal #1 Start Date  03/28/17  [facility] CM Short Term Goal #1 Met Date  04/26/17  Interventions for Short Term Goal #1  reveiw mediastinoscopy,PET scan, sarcoidosis, lymphoma in detaiil, answer questoin ans re educate as needed       [employer] Lavina Hamman, RN, BSN, [place] Care Management [phone]

## 2017-07-11 DIAGNOSIS — E1165 Type 2 diabetes mellitus with hyperglycemia: Secondary | ICD-10-CM | POA: Diagnosis not present

## 2017-07-11 DIAGNOSIS — I10 Essential (primary) hypertension: Secondary | ICD-10-CM | POA: Diagnosis not present

## 2017-07-11 DIAGNOSIS — E7800 Pure hypercholesterolemia, unspecified: Secondary | ICD-10-CM | POA: Diagnosis not present

## 2017-07-11 DIAGNOSIS — E1140 Type 2 diabetes mellitus with diabetic neuropathy, unspecified: Secondary | ICD-10-CM | POA: Diagnosis not present

## 2017-07-12 ENCOUNTER — Other Ambulatory Visit: Payer: Self-pay | Admitting: *Deleted

## 2017-07-15 NOTE — Patient Outreach (Signed)
[place] [facility]) Care Management   07/12/2017  SHERYL SAINTIL 1960/12/22 [id number]  TOULA MIYASAKI is an 56 y.o. female being followed by [facility] CM for care coordination and education related DM management, and recent present diagnostic tests and procedures (abdominal pain, vomiting, nausea, cough since December 2017) and has been seen previously by [facility] for medication assistance. Mrs Felicetti has a PMH of DM Type II, hypertension, hyperlipidemia, morbid obesity, GERD, carpal tunnel syndrome, neuropathy, depression, PTSD, sleep apnea, arthritis and trigger thumb of right hand.   Recent nonspecific chest pain and mediastinal adenopathy indicated recommendation for follow up with pcp for the evaluation of the 01/29/17 abnormal Chest CT imaging that showed "Enlarged mediastinal and bilateral hilar nodes, measuring up to 2.2 cm. Enlarged nodes at the upper abdomen, about the celiac trunk. This could reflect lymphoma, sarcoidosis or a variety of inflammatory conditions. The appearance is less typical for metastatic disease. A number of the mediastinal nodes would be amenable to biopsy, as deemed clinically appropriate." Plus "minimal bibasilar atelectasis noted. Lungs otherwise clear."  Since this [facility] CM last home visit with Mrs Rufino, she has been in the ED x 4(non specific chest pain on 01/29/17, for lower abdominal pain-04/11/17, near syncope 04/23/17 after pre op cxr and cough and vomiting after a mediastinoscopy with a Biopsy for non caseating granulomas consistent with sarcoidosis 04/25/17) and an outpatient mediastinal adenopathy biopsy at [facility] with Dr Roxan Hockey.  Dr Luan Pulling, pcp is now managing most of Mrs Pontiff care with visits to her Endocrinologist Dr Chalmers Cater and therapist.    Subjective: "I'm not feeling well today" "I have been sleeping a lot and feel depressed"  Objective:   Review of Systems  Constitutional: Negative for chills, diaphoresis, fever, malaise/fatigue  and weight loss.  HENT: Negative for congestion, ear discharge, ear pain, hearing loss, nosebleeds, sinus pain, sore throat and tinnitus.   Eyes: Negative for blurred vision, double vision, photophobia, pain, discharge and redness.  Respiratory: Negative for cough, hemoptysis, sputum production, shortness of breath, wheezing and stridor.   Cardiovascular: Positive for chest pain. Negative for palpitations, orthopnea, claudication, leg swelling and PND.  Gastrointestinal: Negative for abdominal pain, blood in stool, constipation, diarrhea, heartburn, melena, nausea and vomiting.  Genitourinary: Negative for dysuria, flank pain, frequency, hematuria and urgency.  Musculoskeletal: Positive for back pain and joint pain. Negative for falls, myalgias and neck pain.  Skin: Negative for itching and rash.  Neurological: Positive for weakness. Negative for dizziness, tingling, tremors, sensory change, speech change, focal weakness, seizures, loss of consciousness and headaches.  Endo/Heme/Allergies: Negative for environmental allergies and polydipsia. Does not bruise/bleed easily.  Psychiatric/Behavioral: Positive for depression. Negative for hallucinations, memory loss, substance abuse and suicidal ideas. The patient is nervous/anxious. The patient does not have insomnia.     Physical Exam  Constitutional: She is oriented to person, place, and time. She appears well-developed and well-nourished.  Neurological: She is alert and oriented to person, place, and time.  Psychiatric: Her behavior is normal. Judgment and thought content normal.    Encounter Medications:   Outpatient Encounter Prescriptions as of 07/12/2017  Medication Sig Note  . Cholecalciferol (VITAMIN D3) 5000 UNITS TABS Take 5,000 Units by mouth daily.    Marland Kitchen FLUoxetine (PROZAC) 20 MG tablet Take 20 mg by mouth daily.   Marland Kitchen gabapentin (NEURONTIN) 300 MG capsule Take 600 mg by mouth 3 (three) times daily.    Marland Kitchen glucose blood (ONE TOUCH ULTRA  TEST) test strip 3 times per day   .  HYDROMET 5-1.5 MG/5ML syrup TK 5 MLS PO Q 6 H PRF COUGH   . insulin degludec (TRESIBA FLEXTOUCH) 100 UNIT/ML SOPN FlexTouch Pen Inject 40-80 Units into the skin daily before breakfast. 04/23/2017: Dosage was verified by the patient. She stated that she uses Tresiba 100 units/ml, NOT Tresiba 200 units/ml!!  . Insulin Pen Needle (BD PEN NEEDLE NANO U/F) 32G X 4 MM MISC 1 each by Does not apply route 4 (four) times daily.   . metoCLOPramide (REGLAN) 10 MG tablet Take 1 tablet (10 mg total) by mouth every 8 (eight) hours as needed for nausea.   . nebivolol (BYSTOLIC) 5 MG tablet Take 5 mg by mouth daily.     Marland Kitchen NOVOLIN N RELION 100 UNIT/ML injection Inject 40 Units into the skin 3 (three) times daily before meals. PER SLIDING SCALE 04/23/2017: Dosage verified by the patient  . ondansetron (ZOFRAN) 4 MG tablet Take 4 mg by mouth every 4 (four) hours as needed for nausea or vomiting.   . predniSONE (DELTASONE) 10 MG tablet    . promethazine (PHENERGAN) 25 MG tablet Take 25 mg by mouth every 6 (six) hours as needed for nausea or vomiting.   . ranitidine (ZANTAC) 75 MG tablet Take 75 mg by mouth at bedtime.   . simvastatin (ZOCOR) 40 MG tablet Take 1 tablet (40 mg total) by mouth every evening. (Patient taking differently: Take 40 mg by mouth at bedtime. )   . sitaGLIPtin (JANUVIA) 100 MG tablet Take 100 mg by mouth every morning.    . triamcinolone cream (KENALOG) 0.5 %     No facility-administered encounter medications on file as of 07/12/2017.     Functional Status:   In your present state of health, do you have any difficulty performing the following activities: 05/31/2017 05/17/2017  Hearing? N N  Vision? N N  Difficulty concentrating or making decisions? N N  Walking or climbing stairs? Y Y  Comment - -  Dressing or bathing? N N  Doing errands, shopping? N N  Preparing Food and eating ? N N  Using the Toilet? N N  In the past six months, have you accidently  leaked urine? N N  Do you have problems with loss of bowel control? N N  Managing your Medications? N N  Managing your Finances? N N  Housekeeping or managing your Housekeeping? N N  Some recent data might be hidden    Fall/Depression Screening:    Fall Risk  05/17/2017 03/30/2017 10/30/2016  Falls in the past year? No No No  Risk for fall due to : Impaired balance/gait;Impaired vision;Medication side effect - -   PHQ 2/9 Scores 05/17/2017 03/18/2017 10/30/2016 10/23/2016 09/04/2016  PHQ - 2 Score $Remov'2 2 4 'NEGbKR$ 0 1  PHQ- 9 Score $Remov'9 6 13 'HtRwbW$ - -    Assessment:   THN CM spoke with Mrs Dowson who reports increased sleeping, depression, concerns with her finances related hospital bills and church concerns She reports changes in her hypertension medications for BP elevations ranging near 179/100 , off tresiba vs Novolin N, metoprolol, prednisone, after seeing her endocrinoogist, Dr Chalmers Cater Diabetes A1C went from 6 to 10 per Mrs Glaude cbg ranges per  Has an appointment with pcp next week  Depression Still has availability to see her therapist but states she does not have money to pay for services  Plan: [facility] CM counseled Mrs Bergdoll to contact the [facility] health billing number on her statement to request assistance with payment plan [facility]  CM collaborated with [facility], [employer] to discuss and receive resources for cost efficient counseling services for Mrs Milewski (finances) and for her to offer to her grandson   Joelene Millin [name], RN, BSN, [place] Care Management [phone]

## 2017-07-17 DIAGNOSIS — I10 Essential (primary) hypertension: Secondary | ICD-10-CM | POA: Diagnosis not present

## 2017-07-17 DIAGNOSIS — E1140 Type 2 diabetes mellitus with diabetic neuropathy, unspecified: Secondary | ICD-10-CM | POA: Diagnosis not present

## 2017-07-17 DIAGNOSIS — D860 Sarcoidosis of lung: Secondary | ICD-10-CM | POA: Diagnosis not present

## 2017-07-19 ENCOUNTER — Ambulatory Visit (HOSPITAL_COMMUNITY)
Admission: RE | Admit: 2017-07-19 | Discharge: 2017-07-19 | Disposition: A | Discharge status: Home / Self Care | Payer: PPO | Source: Ambulatory Visit | Attending: Pulmonary Disease | Admitting: Pulmonary Disease

## 2017-07-19 ENCOUNTER — Other Ambulatory Visit (HOSPITAL_COMMUNITY): Payer: Self-pay | Admitting: Pulmonary Disease

## 2017-07-19 DIAGNOSIS — D869 Sarcoidosis, unspecified: Secondary | ICD-10-CM | POA: Diagnosis not present

## 2017-07-19 DIAGNOSIS — R079 Chest pain, unspecified: Secondary | ICD-10-CM | POA: Diagnosis not present

## 2017-07-29 ENCOUNTER — Other Ambulatory Visit (HOSPITAL_COMMUNITY): Payer: Self-pay | Admitting: Pulmonary Disease

## 2017-07-29 DIAGNOSIS — Z1231 Encounter for screening mammogram for malignant neoplasm of breast: Secondary | ICD-10-CM

## 2017-07-31 DIAGNOSIS — I10 Essential (primary) hypertension: Secondary | ICD-10-CM | POA: Diagnosis not present

## 2017-07-31 DIAGNOSIS — E1140 Type 2 diabetes mellitus with diabetic neuropathy, unspecified: Secondary | ICD-10-CM | POA: Diagnosis not present

## 2017-07-31 DIAGNOSIS — D860 Sarcoidosis of lung: Secondary | ICD-10-CM | POA: Diagnosis not present

## 2017-07-31 LAB — COMPREHENSIVE METABOLIC PANEL: Calcium: 10.2 (ref 8.7–10.7)

## 2017-07-31 LAB — CBC AND DIFFERENTIAL
HCT: 37 (ref 36–46)
Hemoglobin: 12.1 (ref 12.0–16.0)
Platelets: 192 (ref 150–399)
WBC: 2.7

## 2017-07-31 LAB — LIPID PANEL
Cholesterol: 255 — AB (ref 0–200)
HDL: 42 (ref 35–70)
LDL Cholesterol: 161
Triglycerides: 262 — AB (ref 40–160)

## 2017-07-31 LAB — BASIC METABOLIC PANEL
BUN: 11 (ref 4–21)
CO2: 26 — AB (ref 13–22)
Chloride: 98 — AB (ref 99–108)
Creatinine: 1.4 — AB (ref <=1.1)
Glucose: 369
Potassium: 4 (ref 3.4–5.3)
Sodium: 134 — AB (ref 137–147)

## 2017-07-31 LAB — CBC: RBC: 4.56 (ref 3.87–5.11)

## 2017-07-31 LAB — HEMOGLOBIN A1C: Hemoglobin A1C: 10.6

## 2017-08-16 ENCOUNTER — Other Ambulatory Visit: Payer: Self-pay | Admitting: *Deleted

## 2017-08-16 NOTE — Patient Outreach (Signed)
[facility]) Care Management   08/16/2017  Hannah Schultz Nov 12, 1961 [id number]  Hannah Schultz is an 56 y.o. female  Subjective:   Objective:   BP 120/80   Pulse 78   Temp 97.7 F (36.5 C) (Oral)   Resp 20   SpO2 96%   Review of Systems  Constitutional: Negative.  Negative for chills, diaphoresis, fever, malaise/fatigue and weight loss.  HENT: Negative for congestion, ear discharge, ear pain, hearing loss, nosebleeds, sinus pain, sore throat and tinnitus.   Eyes: Positive for blurred vision. Negative for double vision, photophobia, pain, discharge and redness.  Respiratory: Negative.  Negative for cough, hemoptysis, sputum production, shortness of breath, wheezing and stridor.   Cardiovascular: Positive for palpitations. Negative for chest pain, orthopnea, claudication, leg swelling and PND.  Gastrointestinal: Negative.  Negative for abdominal pain, blood in stool, constipation, diarrhea, heartburn, melena, nausea and vomiting.  Genitourinary: Negative.  Negative for dysuria, flank pain, frequency, hematuria and urgency.  Musculoskeletal: Positive for back pain. Negative for falls, joint pain, myalgias and neck pain.  Skin: Positive for itching. Negative for rash.       Arms, legs and face  Neurological: Negative.  Negative for dizziness, tingling, tremors, sensory change, speech change, focal weakness, seizures, loss of consciousness, weakness and headaches.  Endo/Heme/Allergies: Negative.  Negative for environmental allergies and polydipsia. Does not bruise/bleed easily.  Psychiatric/Behavioral: Negative.  Negative for depression, hallucinations, memory loss, substance abuse and suicidal ideas. The patient is not nervous/anxious and does not have insomnia.     Physical Exam  Constitutional: She is oriented to person, place, and time. She appears well-developed and well-nourished.  HENT:  Head: Normocephalic.  Eyes: Pupils are equal, round, and reactive to  light. Conjunctivae are normal.  Neck: Normal range of motion. Neck supple.  Cardiovascular: Normal rate, regular rhythm and normal heart sounds.   Respiratory: Effort normal and breath sounds normal.  GI: Soft. Bowel sounds are normal.  Musculoskeletal: Normal range of motion.  Neurological: She is alert and oriented to person, place, and time.  Skin: Skin is warm and dry.  Psychiatric: She has a normal mood and affect. Her behavior is normal. Judgment and thought content normal.    Encounter Medications:   Outpatient Encounter Prescriptions as of 08/16/2017  Medication Sig Note  . Cholecalciferol (VITAMIN D3) 5000 UNITS TABS Take 5,000 Units by mouth daily.    Marland Kitchen FLUoxetine (PROZAC) 20 MG tablet Take 20 mg by mouth daily.   Marland Kitchen gabapentin (NEURONTIN) 300 MG capsule Take 600 mg by mouth 3 (three) times daily.    Marland Kitchen glucose blood (ONE TOUCH ULTRA TEST) test strip 3 times per day   . HYDROMET 5-1.5 MG/5ML syrup TK 5 MLS PO Q 6 H PRF COUGH   . insulin degludec (TRESIBA FLEXTOUCH) 100 UNIT/ML SOPN FlexTouch Pen Inject 40-80 Units into the skin daily before breakfast. 04/23/2017: Dosage was verified by the patient. She stated that she uses Tresiba 100 units/ml, NOT Tresiba 200 units/ml!!  . Insulin Pen Needle (BD PEN NEEDLE NANO U/F) 32G X 4 MM MISC 1 each by Does not apply route 4 (four) times daily.   . metoCLOPramide (REGLAN) 10 MG tablet Take 1 tablet (10 mg total) by mouth every 8 (eight) hours as needed for nausea.   . nebivolol (BYSTOLIC) 5 MG tablet Take 5 mg by mouth daily.     Marland Kitchen NOVOLIN N RELION 100 UNIT/ML injection Inject 40 Units into the skin 3 (three) times daily before  meals. PER SLIDING SCALE 04/23/2017: Dosage verified by the patient  . ondansetron (ZOFRAN) 4 MG tablet Take 4 mg by mouth every 4 (four) hours as needed for nausea or vomiting.   . predniSONE (DELTASONE) 10 MG tablet    . promethazine (PHENERGAN) 25 MG tablet Take 25 mg by mouth every 6 (six) hours as needed for nausea  or vomiting.   . ranitidine (ZANTAC) 75 MG tablet Take 75 mg by mouth at bedtime.   . simvastatin (ZOCOR) 40 MG tablet Take 1 tablet (40 mg total) by mouth every evening. (Patient taking differently: Take 40 mg by mouth at bedtime. )   . sitaGLIPtin (JANUVIA) 100 MG tablet Take 100 mg by mouth every morning.    . triamcinolone cream (KENALOG) 0.5 %     No facility-administered encounter medications on file as of 08/16/2017.     Functional Status:   In your present state of health, do you have any difficulty performing the following activities: 05/31/2017 05/17/2017  Hearing? N N  Vision? N N  Difficulty concentrating or making decisions? N N  Walking or climbing stairs? Y Y  Comment - -  Dressing or bathing? N N  Doing errands, shopping? N N  Preparing Food and eating ? N N  Using the Toilet? N N  In the past six months, have you accidently leaked urine? N N  Do you have problems with loss of bowel control? N N  Managing your Medications? N N  Managing your Finances? N N  Housekeeping or managing your Housekeeping? N N  Some recent data might be hidden    Fall/Depression Screening:    Fall Risk  05/17/2017 03/30/2017 10/30/2016  Falls in the past year? No No No  Risk for fall due to : Impaired balance/gait;Impaired vision;Medication side effect - -   PHQ 2/9 Scores 05/17/2017 03/18/2017 10/30/2016 10/23/2016 09/04/2016  PHQ - 2 Score '2 2 4 ' 0 1  PHQ- 9 Score '9 6 13 ' - -    Assessment:   Met with Hannah Schultz at her home  Today concern- "felt flutter of my heart" while sitting talking to a girlfriend-CM  Recommended checking with her pcp about scheduling a stress test Educated on atrial fibrillation and symptoms of sarcoidosis.  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 CM reviewed the use of the glucometer and general costs related to it   Hyperlipidemia last visit to Dr Luan Pulling   Preventative care 09/06/17 mammography and pap  completed Reviewed 07/19/17 xray in [facility]  Next [facility] visit need to call for next follow up appointment; Cm encouraged and recommended Hannah Schultz call pcp by November for follow up appointment   No recent eye appointment- CM reviewed available in network ophthalmologists    Plan:  Follow up with Hannah Schultz for monthly update with education and support as needed  Wallace [name], RN, BSN, [place] Care Management [phone]

## 2017-09-05 DIAGNOSIS — Z01419 Encounter for gynecological examination (general) (routine) without abnormal findings: Secondary | ICD-10-CM | POA: Diagnosis not present

## 2017-09-05 DIAGNOSIS — Z6841 Body Mass Index (BMI) 40.0 and over, adult: Secondary | ICD-10-CM | POA: Diagnosis not present

## 2017-09-05 DIAGNOSIS — N958 Other specified menopausal and perimenopausal disorders: Secondary | ICD-10-CM | POA: Diagnosis not present

## 2017-09-06 ENCOUNTER — Ambulatory Visit (HOSPITAL_COMMUNITY)
Admission: RE | Admit: 2017-09-06 | Discharge: 2017-09-06 | Disposition: A | Discharge status: Home / Self Care | Payer: PPO | Source: Ambulatory Visit | Attending: Pulmonary Disease | Admitting: Pulmonary Disease

## 2017-09-06 DIAGNOSIS — Z1231 Encounter for screening mammogram for malignant neoplasm of breast: Secondary | ICD-10-CM | POA: Diagnosis not present

## 2017-09-13 ENCOUNTER — Other Ambulatory Visit: Payer: Self-pay | Admitting: *Deleted

## 2017-09-13 NOTE — Patient Outreach (Signed)
Hannah Schultz [facility]) Care Management   09/13/2017  Hannah Schultz 10-13-1961 [id number]  Hannah Schultz is an 56 y.o. female  Subjective: see notes below Objective:   BP 128/78   Pulse 63   Temp 97.8 F (36.6 C) (Oral)   Wt 234 lb (106.1 kg)   SpO2 96%   BMI 38.94 kg/m   Review of Systems  Constitutional: Negative for chills, diaphoresis, fever, malaise/fatigue and weight loss.  HENT: Negative.   Eyes: Negative.   Respiratory: Negative.  Negative for cough, hemoptysis, sputum production, shortness of breath and wheezing.   Cardiovascular: Positive for chest pain.       Chest pain "come and go" on right side nothing initiating  Gastrointestinal: Negative.  Negative for abdominal pain, blood in stool, constipation, diarrhea, heartburn, melena, nausea and vomiting.  Genitourinary: Negative.  Negative for dysuria, flank pain, frequency, hematuria and urgency.  Musculoskeletal: Positive for back pain. Negative for falls, joint pain, myalgias and neck pain.  Skin: Positive for itching.  Neurological: Positive for weakness. Negative for dizziness, tingling, tremors, sensory change, speech change, focal weakness, seizures, loss of consciousness and headaches.  Endo/Heme/Allergies: Negative for environmental allergies and polydipsia. Bruises/bleeds easily.  Psychiatric/Behavioral: Negative.  Negative for depression, hallucinations, memory loss, substance abuse and suicidal ideas. The patient is not nervous/anxious and does not have insomnia.     Physical Exam  Constitutional: She is oriented to person, place, and time. She appears well-developed and well-nourished.  HENT:  Head: Normocephalic and atraumatic.  Eyes: Pupils are equal, round, and reactive to light. Conjunctivae are normal.  Neck: Normal range of motion. Neck supple.  Cardiovascular: Normal rate and regular rhythm.   Respiratory: Effort normal.  GI: Soft. Bowel sounds are normal.  Musculoskeletal: She  exhibits tenderness.  Neurological: She is alert and oriented to person, place, and time.  Skin: Skin is warm and dry.  Psychiatric: She has a normal mood and affect. Her behavior is normal. Judgment and thought content normal.    Encounter Medications:   Outpatient Encounter Prescriptions as of 09/13/2017  Medication Sig Note  . Cholecalciferol (VITAMIN D3) 5000 UNITS TABS Take 5,000 Units by mouth daily.    Marland Kitchen FLUoxetine (PROZAC) 20 MG tablet Take 20 mg by mouth daily.   Marland Kitchen gabapentin (NEURONTIN) 300 MG capsule Take 600 mg by mouth 3 (three) times daily.    Marland Kitchen glucose blood (ONE TOUCH ULTRA TEST) test strip 3 times per day (Patient not taking: Reported on 08/16/2017)   . HYDROMET 5-1.5 MG/5ML syrup TK 5 MLS PO Q 6 H PRF COUGH   . insulin degludec (TRESIBA FLEXTOUCH) 100 UNIT/ML SOPN FlexTouch Pen Inject 40-80 Units into the skin daily before breakfast. 04/23/2017: Dosage was verified by the patient. She stated that she uses Tresiba 100 units/ml, NOT Tresiba 200 units/ml!!  . Insulin Pen Needle (BD PEN NEEDLE NANO U/F) 32G X 4 MM MISC 1 each by Does not apply route 4 (four) times daily.   . metoCLOPramide (REGLAN) 10 MG tablet Take 1 tablet (10 mg total) by mouth every 8 (eight) hours as needed for nausea.   . nebivolol (BYSTOLIC) 5 MG tablet Take 5 mg by mouth daily.     Marland Kitchen NOVOLIN N RELION 100 UNIT/ML injection Inject 40 Units into the skin 3 (three) times daily before meals. PER SLIDING SCALE 04/23/2017: Dosage verified by the patient  . ondansetron (ZOFRAN) 4 MG tablet Take 4 mg by mouth every 4 (four) hours as needed for nausea  or vomiting.   . predniSONE (DELTASONE) 10 MG tablet    . promethazine (PHENERGAN) 25 MG tablet Take 25 mg by mouth every 6 (six) hours as needed for nausea or vomiting.   . ranitidine (ZANTAC) 75 MG tablet Take 75 mg by mouth at bedtime.   . simvastatin (ZOCOR) 40 MG tablet Take 1 tablet (40 mg total) by mouth every evening. (Patient taking differently: Take 40 mg by  mouth at bedtime. )   . sitaGLIPtin (JANUVIA) 100 MG tablet Take 100 mg by mouth every morning.    . triamcinolone cream (KENALOG) 0.5 %     No facility-administered encounter medications on file as of 09/13/2017.     Functional Status:   In your present state of health, do you have any difficulty performing the following activities: 05/31/2017 05/17/2017  Hearing? N N  Vision? N N  Difficulty concentrating or making decisions? N N  Walking or climbing stairs? Y Y  Comment - -  Dressing or bathing? N N  Doing errands, shopping? N N  Preparing Food and eating ? N N  Using the Toilet? N N  In the past six months, have you accidently leaked urine? N N  Do you have problems with loss of bowel control? N N  Managing your Medications? N N  Managing your Finances? N N  Housekeeping or managing your Housekeeping? N N  Some recent data might be hidden    Fall/Depression Screening:    Fall Risk  05/17/2017 03/30/2017 10/30/2016  Falls in the past year? No No No  Risk for fall due to : Impaired balance/gait;Impaired vision;Medication side effect - -   PHQ 2/9 Scores 05/17/2017 03/18/2017 10/30/2016 10/23/2016 09/04/2016  PHQ - 2 Score '2 2 4 ' 0 1  PHQ- 9 Score '9 6 13 ' - -    Assessment:    Met with Hannah Schultz at her home in her living room. She is in very good spirits to day   Follow up care See Dr Luan Pulling on 09/24/17  Present complaints Today c/o back pain - [facility] CM discussed and advised her to discuss pain management, orthopedic or neurologist services with her pcp  Skin itching head washed out dye used cortisone cream and gold bond for itching with relief. She is stating she believed the itching is related to hair dye   Diabetes Hannah Schultz reports a cbg dropped "the other night" she reports she ate a peanut butter sandwich and a coca cola with improvements.  Her noted signs and symptoms for this episode included "I broke out with sweating in bed" with shaking when she checked her cbg it was 86   Now using kids meals to assist with portion control.  CM recommended having meals every 2-3 hours, at least 3 meals a day or at least a snack at night  Hannah Dondero reports her weight is "still down" She reports that last week her weight ws 234 lbs and she saw gretchen at [facility] She confirms her OB GYN NP, Juanda Chance, was seen and she had a Mammogram. Hannah Holten states all these preventative care results were "negative"    Eye care  Has been postpone by Hannah Lilja related to cost. Hannah Wohlford has a form she showed CM that may assist with further insurance coverage so she can go to see eye dr for free.  Cm also encouraged contact with health team advantage concierge for assistance   Plan: [facility] CM and &  Hannah Ruggirello called [phone] Health Team Advantage Concierge and spoke with [place]  to check on in network dental, dermatologist and vision providers Hannah Reitz wrote these providers and recommendations down for future use  The 2019 the co pays for Ophthalmologist, optometrist, routine eye exams were reviewed  Confirmed for 2018 dental services Hannah Trusty does not have a rider and in network dentists may be obtained by contacting [phone] for envesa. [place] reviewed the new upcoming dental services for Hannah [facility] plan in 2019 and reports dental services will be part of her plan in 2019.  Hannah Kleven prefers to await for new dental services  In network ophthalmology providers includes [facility] [address] [place] at [phone] (mark shapiro)  In network Dermatology includes Dr Marguerite Olea  Cm inquired about a medication formulary and was informed that after 09/23/17 in network provider books will no longer be sent via mail related the size of the item, [place] recommends an Quarry manager to Hannah Dasja Brase [name], RN, BSN, [place] [facility] [phone]

## 2017-09-16 DIAGNOSIS — I10 Essential (primary) hypertension: Secondary | ICD-10-CM | POA: Diagnosis not present

## 2017-09-16 DIAGNOSIS — E1165 Type 2 diabetes mellitus with hyperglycemia: Secondary | ICD-10-CM | POA: Diagnosis not present

## 2017-09-16 DIAGNOSIS — D860 Sarcoidosis of lung: Secondary | ICD-10-CM | POA: Diagnosis not present

## 2017-09-16 DIAGNOSIS — M549 Dorsalgia, unspecified: Secondary | ICD-10-CM | POA: Diagnosis not present

## 2017-09-17 ENCOUNTER — Other Ambulatory Visit (HOSPITAL_COMMUNITY): Payer: Self-pay | Admitting: Pulmonary Disease

## 2017-09-17 ENCOUNTER — Ambulatory Visit (HOSPITAL_COMMUNITY)
Admission: RE | Admit: 2017-09-17 | Discharge: 2017-09-17 | Disposition: A | Discharge status: Home / Self Care | Payer: PPO | Source: Ambulatory Visit | Attending: Pulmonary Disease | Admitting: Pulmonary Disease

## 2017-09-17 DIAGNOSIS — D869 Sarcoidosis, unspecified: Secondary | ICD-10-CM

## 2017-09-17 DIAGNOSIS — M545 Low back pain: Secondary | ICD-10-CM | POA: Diagnosis not present

## 2017-09-17 DIAGNOSIS — R05 Cough: Secondary | ICD-10-CM | POA: Diagnosis not present

## 2017-09-17 DIAGNOSIS — D860 Sarcoidosis of lung: Secondary | ICD-10-CM | POA: Diagnosis not present

## 2017-09-17 DIAGNOSIS — E1165 Type 2 diabetes mellitus with hyperglycemia: Secondary | ICD-10-CM | POA: Diagnosis not present

## 2017-09-17 DIAGNOSIS — I10 Essential (primary) hypertension: Secondary | ICD-10-CM | POA: Diagnosis not present

## 2017-09-17 LAB — HEPATIC FUNCTION PANEL
ALT: 16 (ref 7–35)
AST: 20 (ref 13–35)
Alkaline Phosphatase: 79 (ref 25–125)

## 2017-09-17 LAB — BASIC METABOLIC PANEL
BUN: 19 (ref 4–21)
Creatinine: 1.9 — AB (ref <=1.1)
Glucose: 180

## 2017-09-17 LAB — CBC AND DIFFERENTIAL
HCT: 34 — AB (ref 36–46)
Hemoglobin: 11.1 — AB (ref 12.0–16.0)
Platelets: 215 (ref 150–399)
WBC: 3

## 2017-09-17 LAB — COMPREHENSIVE METABOLIC PANEL
Albumin: 3.9 (ref 3.5–5.0)
Calcium: 11.1 — AB (ref 8.7–10.7)

## 2017-09-17 LAB — HEMOGLOBIN A1C: Hemoglobin A1C: 8.2

## 2017-09-25 DIAGNOSIS — L298 Other pruritus: Secondary | ICD-10-CM | POA: Diagnosis not present

## 2017-09-25 DIAGNOSIS — L818 Other specified disorders of pigmentation: Secondary | ICD-10-CM | POA: Diagnosis not present

## 2017-10-11 ENCOUNTER — Other Ambulatory Visit: Payer: Self-pay | Admitting: *Deleted

## 2017-10-18 ENCOUNTER — Other Ambulatory Visit: Payer: Self-pay | Admitting: *Deleted

## 2017-10-18 NOTE — Patient Outreach (Signed)
[facility]) Care Management   10/18/2017  Hannah Schultz [birth year] [id number]  Hannah Schultz is an 56 y.o. female  Subjective:  See below notes  Objective:   Review of Systems  HENT: Negative for nosebleeds.   Eyes: Negative.  Negative for blurred vision, double vision, photophobia, pain, discharge and redness.  Respiratory: Negative for cough, shortness of breath and wheezing.   Cardiovascular: Negative.  Negative for chest pain, palpitations, orthopnea, claudication, leg swelling and PND.  Gastrointestinal: Negative.  Negative for abdominal pain, diarrhea, heartburn, nausea and vomiting.  Genitourinary: Negative.   Musculoskeletal: Negative.   Skin: Negative.   Neurological: Positive for dizziness and weakness. Negative for tingling, tremors, sensory change, speech change, focal weakness, seizures, loss of consciousness and headaches.  Endo/Heme/Allergies: Does not bruise/bleed easily.  Psychiatric/Behavioral: Negative.     Physical Exam  Constitutional: She is oriented to person, place, and time. She appears well-developed and well-nourished.  HENT:  Head: Normocephalic and atraumatic.  Eyes: Conjunctivae are normal. Pupils are equal, round, and reactive to light.  Neck: Normal range of motion. Neck supple.  Cardiovascular: Normal rate and regular rhythm.  Respiratory: Effort normal and breath sounds normal.  GI: Soft. Bowel sounds are normal.  Musculoskeletal: Normal range of motion.  Neurological: She is alert and oriented to person, place, and time.  Skin: Skin is warm and dry.  Psychiatric: She has a normal mood and affect. Her behavior is normal. Judgment and thought content normal.    Encounter Medications:   Outpatient Encounter Medications as of 10/18/2017  Medication Sig Note  . Cholecalciferol (VITAMIN D3) 5000 UNITS TABS Take 5,000 Units by mouth daily.    Marland Kitchen FLUoxetine (PROZAC) 20 MG tablet Take 20 mg by mouth daily.   Marland Kitchen gabapentin  (NEURONTIN) 300 MG capsule Take 600 mg by mouth 3 (three) times daily.    Marland Kitchen glucose blood (ONE TOUCH ULTRA TEST) test strip 3 times per day (Patient not taking: Reported on 08/16/2017)   . HYDROMET 5-1.5 MG/5ML syrup TK 5 MLS PO Q 6 H PRF COUGH   . insulin degludec (TRESIBA FLEXTOUCH) 100 UNIT/ML SOPN FlexTouch Pen Inject 40-80 Units into the skin daily before breakfast. 04/23/2017: Dosage was verified by the patient. She stated that she uses Tresiba 100 units/ml, NOT Tresiba 200 units/ml!!  . Insulin Pen Needle (BD PEN NEEDLE NANO U/F) 32G X 4 MM MISC 1 each by Does not apply route 4 (four) times daily.   . metoCLOPramide (REGLAN) 10 MG tablet Take 1 tablet (10 mg total) by mouth every 8 (eight) hours as needed for nausea.   . nebivolol (BYSTOLIC) 5 MG tablet Take 5 mg by mouth daily.     Marland Kitchen NOVOLIN N RELION 100 UNIT/ML injection Inject 40 Units into the skin 3 (three) times daily before meals. PER SLIDING SCALE 04/23/2017: Dosage verified by the patient  . ondansetron (ZOFRAN) 4 MG tablet Take 4 mg by mouth every 4 (four) hours as needed for nausea or vomiting.   . predniSONE (DELTASONE) 10 MG tablet    . promethazine (PHENERGAN) 25 MG tablet Take 25 mg by mouth every 6 (six) hours as needed for nausea or vomiting.   . ranitidine (ZANTAC) 75 MG tablet Take 75 mg by mouth at bedtime.   . simvastatin (ZOCOR) 40 MG tablet Take 1 tablet (40 mg total) by mouth every evening. (Patient taking differently: Take 40 mg by mouth at bedtime. )   . sitaGLIPtin (JANUVIA) 100 MG tablet Take  100 mg by mouth every morning.    . triamcinolone cream (KENALOG) 0.5 %     No facility-administered encounter medications on file as of 10/18/2017.     Functional Status:   In your present state of health, do you have any difficulty performing the following activities: 05/31/2017 05/17/2017  Hearing? N N  Vision? N N  Difficulty concentrating or making decisions? N N  Walking or climbing stairs? Y Y  Dressing or bathing? N N   Doing errands, shopping? N N  Preparing Food and eating ? N N  Using the Toilet? N N  In the past six months, have you accidently leaked urine? N N  Do you have problems with loss of bowel control? N N  Managing your Medications? N N  Managing your Finances? N N  Housekeeping or managing your Housekeeping? N N  Some recent data might be hidden    Fall/Depression Screening:    Fall Risk  05/17/2017 03/30/2017 10/30/2016  Falls in the past year? No No No  Risk for fall due to : Impaired balance/gait;Impaired vision;Medication side effect - -   PHQ 2/9 Scores 05/17/2017 03/18/2017 10/30/2016 10/23/2016 09/04/2016  PHQ - 2 Score '2 2 4 ' 0 1  PHQ- 9 Score '9 6 13 ' - -    Assessment:    Met Hannah Schultz and her husband at her home in her living room.  She is smiling and in good spirits today.  Current medical complaints- She reports an "episode" on Monday 10/14/17 at a funeral of "feeling funny" described as dizziness, light headed while walking down an aisle.She was able to sit down to rest until the "episode" was resolved. She did not contact her pcp, or check her BP but did check her cbg and it was within normal limits. CM discussed and educated her on hypotension episodes and use of prednisone.   Saw pcp 09/24/17 and placed back on prednisone Do not want to gain weight related to use of prednisone.  CM and Hannah Schultz had a discussion about the purpose of prednisone related to her sarcoidosis  Management  Back pain/neuropathy- Has not had Neurontin related cost  Diabetes a1c 8.2 on 09/18/17 Was 58 and 15 in pass Want to lose 20 more pounds do not know her weight value as she is preferring not to weigh but gauge her weight changes with the changes in her clothes sizes.  Depression/anxiety Stop sessions with counselor related to increase in co-pay but not interested in [facility] referral at this time  Sees counselor and endocrinology next week.per Hannah Pain   Comparison of insurance [place] local services Referred to [facility] staff and given the address with contact number for June to review her medications to see if her Health Team advantage plan is beneficial for her during this time of open enrollment.  Provided a copy of the abbreviated updated for the 2019 Health team advantage plan   Medications- Hannah Spielberg has noted issues with costs of medications. She has had assistance from her pcp with some samples.  CM reviewed goodrx.com, needymeds.com and medication assistance from drug companies for donut hole concerns.  She had been assisted by [place] early in 2018 and is familiar with resources offered   Plan: follow up with Hannah Kimberlin for monthly home visit  Given [employer] medication assistance application encouraged to request samples from Dr Francene Finders [name], RN, BSN, [place] [facility] [phone]

## 2017-10-28 ENCOUNTER — Other Ambulatory Visit: Payer: Self-pay | Admitting: *Deleted

## 2017-10-28 NOTE — Patient Outreach (Signed)
[facility]) Care Management  10/28/2017  Hannah Schultz November 06, 1961 [id number]   Care coordination  [facility] CM spoke with [name] and her husband who needed assist with samples for medicatios from pcp office for diabetes medications CM again encouraged her to contact June Carlson at [facility] at the [facility] center at [phone] Also discussed recent eye glasses discount services at [employer] in [place] Encouraged [name] to follow up with CM and CM will see her in 3-4 weeks for follow up home visit  [place]. Lavina Hamman, RN, BSN, [place] [facility] Management [phone]

## 2017-10-30 DIAGNOSIS — I10 Essential (primary) hypertension: Secondary | ICD-10-CM | POA: Diagnosis not present

## 2017-10-30 DIAGNOSIS — D860 Sarcoidosis of lung: Secondary | ICD-10-CM | POA: Diagnosis not present

## 2017-10-30 DIAGNOSIS — Z23 Encounter for immunization: Secondary | ICD-10-CM | POA: Diagnosis not present

## 2017-10-30 DIAGNOSIS — E1165 Type 2 diabetes mellitus with hyperglycemia: Secondary | ICD-10-CM | POA: Diagnosis not present

## 2017-11-05 DIAGNOSIS — F332 Major depressive disorder, recurrent severe without psychotic features: Secondary | ICD-10-CM | POA: Diagnosis not present

## 2017-11-15 ENCOUNTER — Other Ambulatory Visit: Payer: Self-pay | Admitting: *Deleted

## 2017-11-15 ENCOUNTER — Other Ambulatory Visit: Payer: Self-pay

## 2017-11-15 NOTE — Patient Outreach (Signed)
[facility]) Care Management   11/15/2017  LEANOR VORIS 07-Mar-1961 [id number]  CEIRA HOESCHEN is an 56 y.o. female  Subjective:  See below notes  Objective:   BP 130/80   Pulse 67   Temp 97.9 F (36.6 C) (Oral)   Resp 20   SpO2 97%  Review of Systems  Constitutional: Negative for chills.       Hot flashes   HENT: Negative.   Eyes: Negative.   Respiratory: Negative.   Cardiovascular: Positive for chest pain. Negative for leg swelling.       Left side of chest  Gastrointestinal: Negative.   Genitourinary: Negative.   Musculoskeletal: Negative.   Skin: Negative.   Neurological: Positive for weakness. Negative for dizziness, tingling, tremors, sensory change, speech change, focal weakness, seizures, loss of consciousness and headaches.  Endo/Heme/Allergies: Negative.   Psychiatric/Behavioral: Negative.     Physical Exam  Constitutional: She is oriented to person, place, and time. She appears well-developed and well-nourished.  HENT:  Head: Normocephalic and atraumatic.  Eyes: Conjunctivae are normal. Pupils are equal, round, and reactive to light.  Neck: Normal range of motion. Neck supple.  Cardiovascular: Normal rate and regular rhythm.  Respiratory: Effort normal and breath sounds normal.  GI: Soft. Bowel sounds are normal.  Musculoskeletal: Normal range of motion.  Neurological: She is alert and oriented to person, place, and time.  Skin: Skin is warm and dry.  Psychiatric: She has a normal mood and affect. Her behavior is normal. Judgment and thought content normal.    Encounter Medications:   Outpatient Encounter Medications as of 11/15/2017  Medication Sig Note  . Cholecalciferol (VITAMIN D3) 5000 UNITS TABS Take 5,000 Units by mouth daily.    Marland Kitchen FLUoxetine (PROZAC) 20 MG tablet Take 20 mg by mouth daily.   Marland Kitchen gabapentin (NEURONTIN) 300 MG capsule Take 600 mg by mouth 3 (three) times daily.    Marland Kitchen glucose blood (ONE TOUCH ULTRA TEST) test  strip 3 times per day   . HYDROMET 5-1.5 MG/5ML syrup TK 5 MLS PO Q 6 H PRF COUGH   . insulin degludec (TRESIBA FLEXTOUCH) 100 UNIT/ML SOPN FlexTouch Pen Inject 40-80 Units into the skin daily before breakfast. 04/23/2017: Dosage was verified by the patient. She stated that she uses Tresiba 100 units/ml, NOT Tresiba 200 units/ml!!  . Insulin Pen Needle (BD PEN NEEDLE NANO U/F) 32G X 4 MM MISC 1 each by Does not apply route 4 (four) times daily.   . metoCLOPramide (REGLAN) 10 MG tablet Take 1 tablet (10 mg total) by mouth every 8 (eight) hours as needed for nausea. (Patient not taking: Reported on 10/18/2017)   . nebivolol (BYSTOLIC) 5 MG tablet Take 5 mg by mouth daily.     Marland Kitchen NOVOLIN N RELION 100 UNIT/ML injection Inject 40 Units into the skin 3 (three) times daily before meals. PER SLIDING SCALE 04/23/2017: Dosage verified by the patient  . ondansetron (ZOFRAN) 4 MG tablet Take 4 mg by mouth every 4 (four) hours as needed for nausea or vomiting.   . predniSONE (DELTASONE) 10 MG tablet    . promethazine (PHENERGAN) 25 MG tablet Take 25 mg by mouth every 6 (six) hours as needed for nausea or vomiting.   . ranitidine (ZANTAC) 75 MG tablet Take 75 mg by mouth at bedtime.   . simvastatin (ZOCOR) 40 MG tablet Take 1 tablet (40 mg total) by mouth every evening. (Patient not taking: Reported on 10/18/2017)   . sitaGLIPtin (  JANUVIA) 100 MG tablet Take 100 mg by mouth every morning.    . triamcinolone cream (KENALOG) 0.5 %     No facility-administered encounter medications on file as of 11/15/2017.     Functional Status:   In your present state of health, do you have any difficulty performing the following activities: 05/31/2017 05/17/2017  Hearing? N N  Vision? N N  Difficulty concentrating or making decisions? N N  Walking or climbing stairs? Y Y  Dressing or bathing? N N  Doing errands, shopping? N N  Preparing Food and eating ? N N  Using the Toilet? N N  In the past six months, have you accidently  leaked urine? N N  Do you have problems with loss of bowel control? N N  Managing your Medications? N N  Managing your Finances? N N  Housekeeping or managing your Housekeeping? N N  Some recent data might be hidden    Fall/Depression Screening:    Fall Risk  05/17/2017 03/30/2017 10/30/2016  Falls in the past year? No No No  Risk for fall due to : Impaired balance/gait;Impaired vision;Medication side effect - -   PHQ 2/9 Scores 05/17/2017 03/18/2017 10/30/2016 10/23/2016 09/04/2016  PHQ - 2 Score _0 0 1  PHQ- 9 Score _1 - -    Assessment:    THN CM met with Mrs Ballman and her husband at her home in her living room.  She is in good spirits today and reports napping prior to CM arrival.   PCP follow up- got flu shot  2 weeks ago along with pneumonia shot  Current noted medical concerns- "Hot flashes"- CM discussed symptoms may be related to use of prednisone or menopausal symptoms. She reports she is almost completed Prednisone 10 mg tapering doses. Referred to pcp. Cm sent an in basket message to pcp/pulmonologist to discuss symptoms and to get advise of plan of care. CM re reviewed symptoms of sarcoidosis.  Vision/Eye exam- She had made an appointment to be seen at [facility] in [place] to have her eyes checked on 12/05/17  Diabetes recent HgA1C= 8 per patient,  was 7.1 in May 2018 Her AIC is improving previously has been 10 and 11 She has recently been on prednisone therapy for sarcoidosis per her pcp/pulmonologist  Medications Mrs Sahlin confirmed she did see the local [facility] staff to review her medications, get medication assistance and her plans from the assessment is to stay with her [employer] coverage. Encouraged to contact CM if new medications issues and a referral to [place] is needed    Plan:   Review of case during [facility] difficult case discussion  11/21/17  Referral to [place] coach  CM reviewed Health coach services and review of her improvements  in goals since May 2018   Cm discussed transfer to health coach services and she agreed to transfer if recommended  Mrs Lacombe needs to continue to work on goals to decrease her HgA1c and completion of preventive care eye exam  Route note to care team members   [facility] CM Care Plan Problem One     Most Recent Value  Care Plan Problem One  Knowledge Deficit related to self health management of Diabetes Mellitus, Type 2, sarcoidosis  Role Documenting the Problem One  Care Management [place] for Problem One  Active  THN Long Term Goal   Over the next 60 days, patient will verbalize understanding of changes  to diet, medication, and exercise regimen as recommended by her endocrinologist and CDE/RD  [facility] Long Term Goal Start Date  05/17/17  Interventions for Problem One Long Term Goal  Utilizing teachback method, discussed at length most recent changes to DIabetes/sarcoidosis treatment plan changes  THN CM Short Term Goal #1   Over the next 7 days,  patient will impletment medication dose change as prescribed and will notify provider with any side effects or cbg findings outside established parameters  THN CM Short Term Goal #1 Start Date  01/25/17  [facility] CM Short Term Goal #1 Met Date  02/05/17  Interventions for Short Term Goal #1  medications reviewed w/ changes to insulin dose  THN CM Short Term Goal #2   Over the next 30 days patient will verbaliize understanding of possible weight reduction surgery as strategy for management of DM  THN CM Short Term Goal #2 Start Date  01/25/17  [facility] CM Short Term Goal #2 Met Date  02/19/17  Interventions for Short Term Goal #2  discussed with patient by phone recommendations as made by PCP/PULM?ENDO,  scheudled visit with patient and plan for education re: weight loss surgery, EMMI article, wt loss surgery book for pt to review  THN CM Short Term Goal #3  over the next 30 days patient will start [facility]  THN CM Short Term  Goal #3 Start Date  05/17/17  [facility] CM Short Term Goal #3 Met Date  05/31/17  Interventions for Short Tern Goal #3  assist with contact to [employer] concierge to get silver fit id # for local [facility], provide meal plans for DM  THN CM Short Term Goal #4  over the next 30 days patient will be able to have a completed eye exam completed   THN CM Short Term Goal #4 Start Date  11/15/17  Interventions for Short Term Goal #4  Re- establish goal 11/15/17, assisted with finding local ophthamalogist in network with HTA, review standards of eye care for Dm patients, answer all questions     [facility] CM Care Plan Problem Two     Most Recent Value  Care Plan Problem Two  acute health condition-chest pain requiring ED visit  Role Documenting the Problem Two  Care Management [place] for Problem Two  Active  Interventions for Problem Two Long Term Goal   re established goal on 05/31/17, RN home visit exam, pt with upcoming GI appt, and note to pcp about reports of chest pain at intervals  [facility] Long Term Goal  over the next 45 days pt will verbalize understanding of plan of care for management of acute chest pain, leg swelling and GI symptoms  THN Long Term Goal Start Date  05/31/17  [facility] Long Term Goal Met Date  07/12/17  THN CM Short Term Goal #1   Over the next 7 days, patient will see pcp/pulmonary provider  as recommended  THN CM Short Term Goal #1 Start Date  01/30/17  [facility] CM Short Term Goal #1 Met Date   02/12/17  Interventions for Short Term Goal #2   assisted patient with pcp/pulmonary provider appointment scheduling,  update not to PCP  [facility] CM Short Term Goal #2   over the next 30 days pt will be seen by pcp and GI providers for chest pain and GI symptoms reported and understand home management for eaach  [facility] CM Short Term Goal #2 Start Date  05/31/17  [facility] CM Short  Term Goal #2 Met Date  07/12/17  Interventions for Short Term Goal #2  RN home exam, review symptoms, report symptoms to pcp/GI provider,  educate patient on home care for each condition, answer patient questions    [facility] CM Care Plan Problem Three     Most Recent Value  Care Plan Problem Three  Elevated HgA1c  Role Documenting the Problem Three  Care Management [place] for Problem Three  Active  THN Long Term Goal   over the next 60 days HgA1c will decrease with diet and exercise  THN Long Term Goal Start Date  11/15/17  Interventions for Problem Three Long Term Goal  Discussed use of prednisone related elevation in A1c, re review DM diet, encourage exercise plan via silver sneakers, encouraged discussion with pcp and endocrinologist, encouraged compliane with diet and exercise on and off prednisone, monitor weight changes, referral to health coach  THN CM Short Term Goal #1   over the next 30 days the patine will have a PET sccan and be scheduled for a biopsy to assist with definitive medical diagnosis  THN CM Short Term Goal #1 Start Date  03/28/17  [facility] CM Short Term Goal #1 Met Date  04/26/17  Interventions for Short Term Goal #1  reveiw mediastinoscopy,PET scan, sarcoidosis, lymphoma in detaiil, answer questoin ans re educate as needed        [employer] Lavina Hamman, RN, BSN, [place] Care Management [phone]

## 2017-11-19 ENCOUNTER — Other Ambulatory Visit: Payer: Self-pay

## 2017-11-19 NOTE — Patient Outreach (Signed)
[place] [facility]) Care Management  11/19/2017  Hannah Schultz 1961/06/12 [id number]   1st Telephone call placed to the patient for initial assessment. No answer. HIPAA compliant voicemail left with contact information.  Plan: RN Health Coach will make an outreach attempt to the patient within the month of December.   Lazaro Arms RN, BSN, [place] Direct Dial:  [phone] Fax: [phone]

## 2017-12-04 ENCOUNTER — Other Ambulatory Visit: Payer: Self-pay

## 2017-12-04 NOTE — Patient Outreach (Signed)
[place] [facility]) Care Management  12/04/2017  Hannah Schultz [birth year] [id number]    2nd Telephone call to patient for initial assessment.  No answer. HIPAA compliant voice message left with contact information.   Plan: RN Health Coach will make an outreach attempt to the patient with in three business days.  Lazaro Arms RN, BSN, [place] Direct Dial:  [phone] Fax: [phone]

## 2017-12-06 ENCOUNTER — Other Ambulatory Visit: Payer: Self-pay

## 2017-12-06 ENCOUNTER — Encounter: Payer: Self-pay | Admitting: *Deleted

## 2017-12-06 NOTE — Patient Outreach (Signed)
[place] [facility]) Care Management  12/06/2017  KAYE LUOMA 01-14-1961 [id number]   3rd telephone call placed to the patient for initial assessment. No answer. HIPAA compliant voicemail left with contact information.  Plan:  RN Health Coach will send letter to attempt outreach.  If no response within ten business days will proceed with case closure.  Lazaro Arms RN, BSN, [place] Direct Dial:  [phone] Fax: [phone]

## 2017-12-09 DIAGNOSIS — E1140 Type 2 diabetes mellitus with diabetic neuropathy, unspecified: Secondary | ICD-10-CM | POA: Diagnosis not present

## 2017-12-09 DIAGNOSIS — E1165 Type 2 diabetes mellitus with hyperglycemia: Secondary | ICD-10-CM | POA: Diagnosis not present

## 2017-12-09 DIAGNOSIS — I10 Essential (primary) hypertension: Secondary | ICD-10-CM | POA: Diagnosis not present

## 2017-12-09 DIAGNOSIS — E7800 Pure hypercholesterolemia, unspecified: Secondary | ICD-10-CM | POA: Diagnosis not present

## 2017-12-13 ENCOUNTER — Ambulatory Visit: Payer: Self-pay | Admitting: *Deleted

## 2017-12-24 ENCOUNTER — Other Ambulatory Visit: Payer: Self-pay

## 2017-12-24 NOTE — Patient Outreach (Signed)
[place] [facility]) Care Management  12/24/2017  ANALEA MULLER 1961/04/10 [id number]   Patient has not responded to calls or letter. Will proceed with case closure.   Plan: Will notify care management assistant of case status.    Lazaro Arms RN, BSN, [place] Direct Dial:  [phone] Fax: [phone]

## 2018-01-24 DIAGNOSIS — I10 Essential (primary) hypertension: Secondary | ICD-10-CM | POA: Diagnosis not present

## 2018-01-24 DIAGNOSIS — E1165 Type 2 diabetes mellitus with hyperglycemia: Secondary | ICD-10-CM | POA: Diagnosis not present

## 2018-01-24 DIAGNOSIS — D860 Sarcoidosis of lung: Secondary | ICD-10-CM | POA: Diagnosis not present

## 2018-01-24 DIAGNOSIS — E7800 Pure hypercholesterolemia, unspecified: Secondary | ICD-10-CM | POA: Diagnosis not present

## 2018-01-24 DIAGNOSIS — E1140 Type 2 diabetes mellitus with diabetic neuropathy, unspecified: Secondary | ICD-10-CM | POA: Diagnosis not present

## 2018-01-29 DIAGNOSIS — N764 Abscess of vulva: Secondary | ICD-10-CM | POA: Diagnosis not present

## 2018-02-05 ENCOUNTER — Other Ambulatory Visit: Payer: Self-pay

## 2018-02-05 ENCOUNTER — Inpatient Hospital Stay (HOSPITAL_COMMUNITY)
Admission: AD | Admit: 2018-02-05 | Discharge: 2018-02-07 | DRG: 746 | Disposition: A | Discharge status: Home / Self Care | Payer: PPO | Source: Ambulatory Visit | Attending: Obstetrics and Gynecology | Admitting: Obstetrics and Gynecology

## 2018-02-05 DIAGNOSIS — N94819 Vulvodynia, unspecified: Secondary | ICD-10-CM | POA: Diagnosis not present

## 2018-02-05 DIAGNOSIS — E1140 Type 2 diabetes mellitus with diabetic neuropathy, unspecified: Secondary | ICD-10-CM | POA: Diagnosis not present

## 2018-02-05 DIAGNOSIS — M1990 Unspecified osteoarthritis, unspecified site: Secondary | ICD-10-CM | POA: Diagnosis not present

## 2018-02-05 DIAGNOSIS — F329 Major depressive disorder, single episode, unspecified: Secondary | ICD-10-CM | POA: Diagnosis not present

## 2018-02-05 DIAGNOSIS — K219 Gastro-esophageal reflux disease without esophagitis: Secondary | ICD-10-CM | POA: Diagnosis present

## 2018-02-05 DIAGNOSIS — Z794 Long term (current) use of insulin: Secondary | ICD-10-CM

## 2018-02-05 DIAGNOSIS — N764 Abscess of vulva: Secondary | ICD-10-CM | POA: Diagnosis not present

## 2018-02-05 DIAGNOSIS — D869 Sarcoidosis, unspecified: Secondary | ICD-10-CM | POA: Diagnosis not present

## 2018-02-05 DIAGNOSIS — Z6841 Body Mass Index (BMI) 40.0 and over, adult: Secondary | ICD-10-CM

## 2018-02-05 DIAGNOSIS — E119 Type 2 diabetes mellitus without complications: Secondary | ICD-10-CM | POA: Diagnosis not present

## 2018-02-05 DIAGNOSIS — I10 Essential (primary) hypertension: Secondary | ICD-10-CM | POA: Diagnosis present

## 2018-02-05 DIAGNOSIS — E785 Hyperlipidemia, unspecified: Secondary | ICD-10-CM | POA: Diagnosis not present

## 2018-02-05 LAB — GLUCOSE, CAPILLARY
GLUCOSE-CAPILLARY: 198 mg/dL — AB (ref 65–99)
Glucose-Capillary: 173 mg/dL — ABNORMAL HIGH (ref 65–99)

## 2018-02-05 LAB — COMPREHENSIVE METABOLIC PANEL
ALK PHOS: 70 U/L (ref 38–126)
ALT: 15 U/L (ref 14–54)
ANION GAP: 12 (ref 5–15)
AST: 20 U/L (ref 15–41)
Albumin: 3.4 g/dL — ABNORMAL LOW (ref 3.5–5.0)
BILIRUBIN TOTAL: 0.6 mg/dL (ref 0.3–1.2)
BUN: 24 mg/dL — ABNORMAL HIGH (ref 6–20)
CALCIUM: 9.4 mg/dL (ref 8.9–10.3)
CO2: 23 mmol/L (ref 22–32)
Chloride: 98 mmol/L — ABNORMAL LOW (ref 101–111)
Creatinine, Ser: 1.69 mg/dL — ABNORMAL HIGH (ref 0.44–1.00)
GFR calc Af Amer: 39 mL/min — ABNORMAL LOW (ref >60)
GFR calc non Af Amer: 33 mL/min — ABNORMAL LOW (ref >60)
Glucose, Bld: 162 mg/dL — ABNORMAL HIGH (ref 65–99)
Potassium: 4.7 mmol/L (ref 3.5–5.1)
Sodium: 133 mmol/L — ABNORMAL LOW (ref 135–145)
TOTAL PROTEIN: 8.4 g/dL — AB (ref 6.5–8.1)

## 2018-02-05 LAB — CBC
HEMATOCRIT: 34.3 % — AB (ref 36.0–46.0)
HEMOGLOBIN: 11.5 g/dL — AB (ref 12.0–15.0)
MCH: 27.1 pg (ref 26.0–34.0)
MCHC: 33.5 g/dL (ref 30.0–36.0)
MCV: 80.9 fL (ref 78.0–100.0)
Platelets: 291 10*3/uL (ref 150–400)
RBC: 4.24 MIL/uL (ref 3.87–5.11)
RDW: 13.8 % (ref 11.5–15.5)
WBC: 6.9 10*3/uL (ref 4.0–10.5)

## 2018-02-05 LAB — HEMOGLOBIN A1C
Hgb A1c MFr Bld: 9.5 % — ABNORMAL HIGH (ref 4.8–5.6)
MEAN PLASMA GLUCOSE: 225.95 mg/dL

## 2018-02-05 MED ORDER — ONDANSETRON HCL 4 MG/2ML IJ SOLN: 4.0000 mg | Freq: Four times a day (QID) | INTRAMUSCULAR | Status: DC | PRN

## 2018-02-05 MED ORDER — SODIUM CHLORIDE 0.9 % IV SOLN
INTRAVENOUS | Status: DC
  Administered 2018-02-06 – 2018-02-07 (×2): via INTRAVENOUS

## 2018-02-05 MED ORDER — NALOXONE HCL 0.4 MG/ML IJ SOLN: 0.4000 mg | INTRAMUSCULAR | Status: DC | PRN

## 2018-02-05 MED ORDER — INSULIN NPH (HUMAN) (ISOPHANE) 100 UNIT/ML ~~LOC~~ SUSP: 60.0000 [IU] | Freq: Three times a day (TID) | SUBCUTANEOUS | Status: DC

## 2018-02-05 MED ORDER — DIPHENHYDRAMINE HCL 12.5 MG/5ML PO ELIX: 12.5000 mg | ORAL_SOLUTION | Freq: Four times a day (QID) | ORAL | Status: DC | PRN

## 2018-02-05 MED ORDER — SODIUM CHLORIDE 0.9% FLUSH: 9.0000 mL | INTRAVENOUS | Status: DC | PRN

## 2018-02-05 MED ORDER — AMLODIPINE BESYLATE 5 MG PO TABS
5.0000 mg | ORAL_TABLET | Freq: Every day | ORAL | Status: DC
  Administered 2018-02-06 – 2018-02-07 (×2): 5 mg via ORAL
  Filled 2018-02-05: qty 1

## 2018-02-05 MED ORDER — LINAGLIPTIN 5 MG PO TABS
5.0000 mg | ORAL_TABLET | Freq: Every day | ORAL | Status: DC
  Administered 2018-02-06 – 2018-02-07 (×2): 5 mg via ORAL
  Filled 2018-02-05 (×3): qty 1

## 2018-02-05 MED ORDER — METOPROLOL SUCCINATE ER 50 MG PO TB24
50.0000 mg | ORAL_TABLET | Freq: Every day | ORAL | Status: DC
  Administered 2018-02-05 – 2018-02-07 (×3): 50 mg via ORAL
  Filled 2018-02-05 (×4): qty 1

## 2018-02-05 MED ORDER — INSULIN ASPART 100 UNIT/ML ~~LOC~~ SOLN
0.0000 [IU] | Freq: Three times a day (TID) | SUBCUTANEOUS | Status: DC
  Administered 2018-02-05: 4 [IU] via SUBCUTANEOUS
  Administered 2018-02-06: 3 [IU] via SUBCUTANEOUS
  Administered 2018-02-06 (×2): 4 [IU] via SUBCUTANEOUS
  Administered 2018-02-06 – 2018-02-07 (×2): 3 [IU] via SUBCUTANEOUS

## 2018-02-05 MED ORDER — HYDROMORPHONE 1 MG/ML IV SOLN
INTRAVENOUS | Status: DC
  Administered 2018-02-05: 17:00:00 via INTRAVENOUS
  Administered 2018-02-05: 0.4 mg via INTRAVENOUS
  Administered 2018-02-06: 0.2 mg via INTRAVENOUS
  Administered 2018-02-06: 17:00:00 via INTRAVENOUS
  Administered 2018-02-06 (×3): 0.2 mg via INTRAVENOUS
  Filled 2018-02-05 (×2): qty 25

## 2018-02-05 MED ORDER — INSULIN ASPART 100 UNIT/ML ~~LOC~~ SOLN: 0.0000 [IU] | Freq: Three times a day (TID) | SUBCUTANEOUS | Status: DC

## 2018-02-05 MED ORDER — FLUOXETINE HCL 20 MG PO CAPS
20.0000 mg | ORAL_CAPSULE | Freq: Every day | ORAL | Status: DC
  Administered 2018-02-06 – 2018-02-07 (×2): 20 mg via ORAL
  Filled 2018-02-05 (×4): qty 1

## 2018-02-05 MED ORDER — ALBUTEROL SULFATE (2.5 MG/3ML) 0.083% IN NEBU: 3.0000 mL | INHALATION_SOLUTION | Freq: Four times a day (QID) | RESPIRATORY_TRACT | Status: DC | PRN

## 2018-02-05 MED ORDER — DEXTROSE-NACL 5-0.9 % IV SOLN
INTRAVENOUS | Status: DC
Start: 2018-02-06 — End: 2018-02-06
  Administered 2018-02-06: 07:00:00 via INTRAVENOUS

## 2018-02-05 MED ORDER — VANCOMYCIN HCL IN DEXTROSE 1-5 GM/200ML-% IV SOLN
1000.0000 mg | Freq: Three times a day (TID) | INTRAVENOUS | Status: DC
  Administered 2018-02-05 – 2018-02-06 (×3): 1000 mg via INTRAVENOUS
  Filled 2018-02-05 (×3): qty 200

## 2018-02-05 MED ORDER — FAMOTIDINE 20 MG PO TABS
20.0000 mg | ORAL_TABLET | Freq: Two times a day (BID) | ORAL | Status: DC
  Administered 2018-02-05 – 2018-02-07 (×4): 20 mg via ORAL
  Filled 2018-02-05 (×4): qty 1

## 2018-02-05 MED ORDER — INSULIN ASPART 100 UNIT/ML ~~LOC~~ SOLN
0.0000 [IU] | Freq: Three times a day (TID) | SUBCUTANEOUS | Status: DC
Start: 2018-02-06 — End: 2018-02-05

## 2018-02-05 MED ORDER — PREDNISONE 10 MG PO TABS
10.0000 mg | ORAL_TABLET | Freq: Every day | ORAL | Status: DC
  Administered 2018-02-06 – 2018-02-07 (×2): 10 mg via ORAL
  Filled 2018-02-05 (×3): qty 1

## 2018-02-05 MED ORDER — DIPHENHYDRAMINE HCL 50 MG/ML IJ SOLN
12.5000 mg | Freq: Four times a day (QID) | INTRAMUSCULAR | Status: DC | PRN
  Administered 2018-02-06: 12.5 mg via INTRAVENOUS
  Filled 2018-02-05: qty 1

## 2018-02-05 MED ORDER — VANCOMYCIN HCL 10 G IV SOLR: 1500.0000 mg | Freq: Three times a day (TID) | INTRAVENOUS | Status: DC

## 2018-02-05 NOTE — H&P (Addendum)
Hannah Schultz is an 57 y.o. female admitted with vulvar abscess.  She presented to office 1 wk ago with c/o painful vulvar mass.  She was treated conservatively with oral bactrim DS, sitz baths, & warm compresses.  Reports some foul smelling drainage over the weekend but worsening pain despite oral abx and pain meds.  No f/c, n/v/d.  Pt is IDDM and reports her BG have been high because she is on prednisone for sarcoidosis.    Menstrual History: No LMP recorded. Patient has had a hysterectomy.    Past Medical History:  Diagnosis Date  . Acid reflux   . Arthritis   . Bulging lumbar disc   . Depression   . Diabetes mellitus   . Diabetic neuropathy ([place])   . High cholesterol   . Hypertension   . Post traumatic stress disorder (PTSD)   . Sleep apnea     Past Surgical History:  Procedure Laterality Date  . ABDOMINAL HYSTERECTOMY    . CHOLECYSTECTOMY    . COLONOSCOPY    . MEDIASTINOSCOPY N/A 04/25/2017   Procedure: MEDIASTINOSCOPY;  Surgeon: Melrose Nakayama, MD;  Location: [place];  Service: Thoracic;  Laterality: N/A;  . TONSILLECTOMY  04/02/2012   Procedure: TONSILLECTOMY;  Surgeon: Ascencion Dike, MD;  Location: [facility] OR;  Service: ENT;  Laterality: Bilateral;    Family History  Problem Relation Age of Onset  . Heart disease Unknown   . Cancer Unknown   . Diabetes Unknown     Social History:  reports that  has never smoked. she has never used smokeless tobacco. She reports that she does not drink alcohol or use drugs.  Allergies:  Allergies  Allergen Reactions  . Adhesive [Tape] Other (See Comments)    Adhesive tape "tears skin"    Medications Prior to Admission  Medication Sig Dispense Refill Last Dose  . amLODipine (NORVASC) 5 MG tablet Take 5 mg by mouth daily.    02/04/2018 at Unknown time  . Cholecalciferol (VITAMIN D3) 5000 UNITS TABS Take 5,000 Units by mouth daily.    02/04/2018 at Unknown time  . FLUoxetine (PROZAC) 20 MG tablet Take 20 mg by mouth daily.    02/04/2018 at Unknown time  . insulin regular (NOVOLIN R,HUMULIN R) 100 units/mL injection Inject 10-20 Units into the skin 3 (three) times daily before meals. Based on sliding scale   02/05/2018 at Unknown time  . metoprolol succinate (TOPROL-XL) 50 MG 24 hr tablet Take 50 mg by mouth daily.   02/04/2018 at 1200  . NOVOLIN N RELION 100 UNIT/ML injection Inject 50-70 Units into the skin 3 (three) times daily before meals. 70 units breakfast and lunch, 50 units at bedtime   02/05/2018 at Unknown time  . predniSONE (DELTASONE) 10 MG tablet Take 10 mg by mouth daily with breakfast.    02/05/2018 at Unknown time  . PROAIR HFA 108 (90 Base) MCG/ACT inhaler Inhale 1-2 puffs into the lungs every 6 (six) hours as needed for wheezing or shortness of breath.    Past Week at Unknown time  . ranitidine (ZANTAC) 75 MG tablet Take 75 mg by mouth at bedtime.   02/04/2018 at Unknown time  . simvastatin (ZOCOR) 40 MG tablet Take 1 tablet (40 mg total) by mouth every evening. 30 tablet 5 02/04/2018 at Unknown time  . sitaGLIPtin (JANUVIA) 100 MG tablet Take 100 mg by mouth every morning.    02/05/2018 at Unknown time  . sulfamethoxazole-trimethoprim (BACTRIM DS,SEPTRA DS) 800-160 MG tablet  Take 1 tablet by mouth 2 (two) times daily.   02/05/2018 at Unknown time  . triamcinolone cream (KENALOG) 0.5 % Apply 1 application topically daily.    Past Month at Unknown time  . glucose blood (ONE TOUCH ULTRA TEST) test strip 3 times per day 100 each 0 Taking    ROS  Blood pressure (!) 110/52, pulse 76, temperature 98.5 F (36.9 C), temperature source Oral, resp. rate 16, height $RemoveBe'5\' 5"'gzsNfVWrr$  (1.651 m), weight 257 lb (116.6 kg). Physical Exam  \Gen - NAD Abd - soft, NT PV - large 7cm fluctuant,tender mass on right mons pubis extending to midline.  No drainage.  erythematous  Results for orders placed or performed during the hospital encounter of 02/05/18 (from the past 24 hour(s))  Glucose, capillary     Status: Abnormal   Collection  Time: 02/05/18  4:04 PM  Result Value Ref Range   Glucose-Capillary 173 (H) 65 - 99 mg/dL    Assessment/Plan: Admit for IV antibiotics and pain control Possible I&D   Marylynn Pearson 02/05/2018, 5:09 PM

## 2018-02-06 ENCOUNTER — Encounter (HOSPITAL_COMMUNITY)
Admission: AD | Disposition: A | Discharge status: Home / Self Care | Payer: Self-pay | Source: Ambulatory Visit | Attending: Obstetrics and Gynecology

## 2018-02-06 ENCOUNTER — Encounter (HOSPITAL_COMMUNITY): Payer: Self-pay | Admitting: Anesthesiology

## 2018-02-06 ENCOUNTER — Inpatient Hospital Stay (HOSPITAL_COMMUNITY): Payer: PPO | Admitting: Anesthesiology

## 2018-02-06 HISTORY — PX: INCISION AND DRAINAGE ABSCESS: SHX5864

## 2018-02-06 LAB — GLUCOSE, CAPILLARY
GLUCOSE-CAPILLARY: 149 mg/dL — AB (ref 65–99)
GLUCOSE-CAPILLARY: 180 mg/dL — AB (ref 65–99)
GLUCOSE-CAPILLARY: 152 mg/dL — AB (ref 65–99)
GLUCOSE-CAPILLARY: 159 mg/dL — AB (ref 65–99)
GLUCOSE-CAPILLARY: 149 mg/dL — AB (ref 65–99)

## 2018-02-06 SURGERY — INCISION AND DRAINAGE, ABSCESS
Anesthesia: General | Site: Vulva

## 2018-02-06 MED ORDER — ACETAMINOPHEN 160 MG/5ML PO SOLN: 325.0000 mg | ORAL | Status: DC | PRN

## 2018-02-06 MED ORDER — ALBUTEROL SULFATE HFA 108 (90 BASE) MCG/ACT IN AERS
INHALATION_SPRAY | RESPIRATORY_TRACT | Status: AC
  Filled 2018-02-06: qty 6.7

## 2018-02-06 MED ORDER — SOD CITRATE-CITRIC ACID 500-334 MG/5ML PO SOLN
ORAL | Status: AC
  Administered 2018-02-06: 30 mL
  Filled 2018-02-06: qty 15

## 2018-02-06 MED ORDER — OXYCODONE-ACETAMINOPHEN 5-325 MG PO TABS
1.0000 | ORAL_TABLET | ORAL | Status: DC | PRN
  Administered 2018-02-07: 1 via ORAL
  Filled 2018-02-06: qty 1

## 2018-02-06 MED ORDER — KETOROLAC TROMETHAMINE 30 MG/ML IJ SOLN: 30.0000 mg | Freq: Once | INTRAMUSCULAR | Status: DC | PRN

## 2018-02-06 MED ORDER — FENTANYL CITRATE (PF) 100 MCG/2ML IJ SOLN
INTRAMUSCULAR | Status: AC
Start: 2018-02-06 — End: 2018-02-07
  Filled 2018-02-06: qty 2

## 2018-02-06 MED ORDER — LIDOCAINE HCL (CARDIAC) 20 MG/ML IV SOLN
INTRAVENOUS | Status: AC
  Filled 2018-02-06: qty 5

## 2018-02-06 MED ORDER — PROPOFOL 10 MG/ML IV BOLUS
INTRAVENOUS | Status: AC
  Filled 2018-02-06: qty 20

## 2018-02-06 MED ORDER — OXYCODONE HCL 5 MG PO TABS: 5.0000 mg | ORAL_TABLET | Freq: Once | ORAL | Status: DC | PRN

## 2018-02-06 MED ORDER — 0.9 % SODIUM CHLORIDE (POUR BTL) OPTIME
TOPICAL | Status: DC | PRN
  Administered 2018-02-06: 1000 mL

## 2018-02-06 MED ORDER — MIDAZOLAM HCL 2 MG/2ML IJ SOLN
INTRAMUSCULAR | Status: DC | PRN
  Administered 2018-02-06: 2 mg via INTRAVENOUS

## 2018-02-06 MED ORDER — HYDROMORPHONE HCL 1 MG/ML IJ SOLN
0.5000 mg | INTRAMUSCULAR | Status: DC | PRN
  Administered 2018-02-06: 0.5 mg via INTRAVENOUS

## 2018-02-06 MED ORDER — MEPERIDINE HCL 25 MG/ML IJ SOLN: 6.2500 mg | INTRAMUSCULAR | Status: DC | PRN

## 2018-02-06 MED ORDER — FENTANYL CITRATE (PF) 100 MCG/2ML IJ SOLN
25.0000 ug | INTRAMUSCULAR | Status: DC | PRN
  Administered 2018-02-06 (×4): 25 ug via INTRAVENOUS

## 2018-02-06 MED ORDER — ONDANSETRON HCL 4 MG/2ML IJ SOLN: 4.0000 mg | Freq: Once | INTRAMUSCULAR | Status: DC | PRN

## 2018-02-06 MED ORDER — LACTATED RINGERS IV SOLN
INTRAVENOUS | Status: DC | PRN
  Administered 2018-02-06: 14:00:00 via INTRAVENOUS

## 2018-02-06 MED ORDER — MIDAZOLAM HCL 2 MG/2ML IJ SOLN
INTRAMUSCULAR | Status: AC
  Filled 2018-02-06: qty 2

## 2018-02-06 MED ORDER — KETOROLAC TROMETHAMINE 30 MG/ML IJ SOLN
INTRAMUSCULAR | Status: AC
  Filled 2018-02-06: qty 1

## 2018-02-06 MED ORDER — DEXAMETHASONE SODIUM PHOSPHATE 4 MG/ML IJ SOLN
INTRAMUSCULAR | Status: AC
  Filled 2018-02-06: qty 1

## 2018-02-06 MED ORDER — ACETAMINOPHEN 325 MG PO TABS: 325.0000 mg | ORAL_TABLET | ORAL | Status: DC | PRN

## 2018-02-06 MED ORDER — HYDROMORPHONE HCL 1 MG/ML IJ SOLN
INTRAMUSCULAR | Status: AC
  Filled 2018-02-06: qty 0.5

## 2018-02-06 MED ORDER — PROPOFOL 10 MG/ML IV BOLUS
INTRAVENOUS | Status: DC | PRN
  Administered 2018-02-06: 160 mg via INTRAVENOUS

## 2018-02-06 MED ORDER — ONDANSETRON HCL 4 MG/2ML IJ SOLN
INTRAMUSCULAR | Status: DC | PRN
  Administered 2018-02-06: 4 mg via INTRAVENOUS

## 2018-02-06 MED ORDER — LIDOCAINE HCL (CARDIAC) 20 MG/ML IV SOLN
INTRAVENOUS | Status: DC | PRN
  Administered 2018-02-06: 50 mg via INTRAVENOUS

## 2018-02-06 MED ORDER — FENTANYL CITRATE (PF) 100 MCG/2ML IJ SOLN
INTRAMUSCULAR | Status: DC | PRN
  Administered 2018-02-06 (×2): 50 ug via INTRAVENOUS

## 2018-02-06 MED ORDER — OXYCODONE HCL 5 MG/5ML PO SOLN: 5.0000 mg | Freq: Once | ORAL | Status: DC | PRN

## 2018-02-06 MED ORDER — ALBUTEROL SULFATE HFA 108 (90 BASE) MCG/ACT IN AERS
INHALATION_SPRAY | RESPIRATORY_TRACT | Status: DC | PRN
  Administered 2018-02-06: 4 via RESPIRATORY_TRACT

## 2018-02-06 MED ORDER — VANCOMYCIN HCL 10 G IV SOLR
1250.0000 mg | INTRAVENOUS | Status: DC
  Administered 2018-02-07: 1250 mg via INTRAVENOUS
  Filled 2018-02-06: qty 500

## 2018-02-06 MED ORDER — LACTATED RINGERS IV SOLN
INTRAVENOUS | Status: DC
  Administered 2018-02-06: 16:00:00 via INTRAVENOUS

## 2018-02-06 MED ORDER — ONDANSETRON HCL 4 MG/2ML IJ SOLN
INTRAMUSCULAR | Status: AC
  Filled 2018-02-06: qty 2

## 2018-02-06 MED ORDER — FENTANYL CITRATE (PF) 100 MCG/2ML IJ SOLN
INTRAMUSCULAR | Status: AC
  Filled 2018-02-06: qty 2

## 2018-02-06 SURGICAL SUPPLY — 19 items
BLADE SURG 15 STRL LF C SS BP (BLADE) IMPLANT
BLADE SURG 15 STRL SS (BLADE) ×2
ELECT REM PT RETURN 9FT ADLT (ELECTROSURGICAL) ×2
ELECTRODE REM PT RTRN 9FT ADLT (ELECTROSURGICAL) IMPLANT
GAUZE PACKING IODOFORM 2 (PACKING) ×1 IMPLANT
GLOVE BIO SURGEON STRL SZ 6.5 (GLOVE) ×1 IMPLANT
GLOVE BIOGEL PI IND STRL 7.0 (GLOVE) IMPLANT
GLOVE BIOGEL PI INDICATOR 7.0 (GLOVE) ×1
GOWN STRL REUS W/ TWL LRG LVL3 (GOWN DISPOSABLE) IMPLANT
GOWN STRL REUS W/TWL LRG LVL3 (GOWN DISPOSABLE) ×4
PACK VAGINAL MINOR WOMEN LF (CUSTOM PROCEDURE TRAY) ×1 IMPLANT
PAD PREP 24X48 CUFFED NSTRL (MISCELLANEOUS) ×1 IMPLANT
PENCIL BUTTON HOLSTER BLD 10FT (ELECTRODE) ×1 IMPLANT
SWAB COLLECTION DEVICE MRSA (MISCELLANEOUS) ×1 IMPLANT
SWAB CULTURE ESWAB REG 1ML (MISCELLANEOUS) ×1 IMPLANT
SYR BULB IRRIGATION 50ML (SYRINGE) ×1 IMPLANT
TOWEL OR 17X24 6PK STRL BLUE (TOWEL DISPOSABLE) ×2 IMPLANT
TUBING NON-CON 1/4 X 20 CONN (TUBING) ×1 IMPLANT
YANKAUER SUCT BULB TIP NO VENT (SUCTIONS) ×1 IMPLANT

## 2018-02-06 NOTE — Anesthesia Preprocedure Evaluation (Addendum)
Anesthesia Evaluation  Patient identified by MRN, date of birth, ID band Patient awake    Reviewed: Allergy & Precautions, NPO status , Patient's Chart, lab work & pertinent test results  History of Anesthesia Complications Negative for: history of anesthetic complications  Airway Mallampati: II  TM Distance: >3 FB Neck ROM: Full    Dental  (+) Teeth Intact   Pulmonary sleep apnea and Continuous Positive Airway Pressure Ventilation ,    Pulmonary exam normal breath sounds clear to auscultation       Cardiovascular hypertension, Pt. on medications (-) angina(-) Past MI and (-) CHF Normal cardiovascular exam Rhythm:Regular     Neuro/Psych PSYCHIATRIC DISORDERS Depression  Neuromuscular disease    GI/Hepatic GERD  Medicated and Controlled,  Endo/Other  diabetes, Type 2, Insulin DependentMorbid obesity  Renal/GU      Musculoskeletal   Abdominal   Peds  Hematology   Anesthesia Other Findings   Reproductive/Obstetrics                           Anesthesia Physical  Anesthesia Plan  ASA: III  Anesthesia Plan: General   Post-op Pain Management:    Induction: Intravenous  PONV Risk Score and Plan: 2 and Ondansetron, Treatment may vary due to age or medical condition and Midazolam  Airway Management Planned: Oral ETT and LMA  Additional Equipment: None  Intra-op Plan:   Post-operative Plan: Extubation in OR  Informed Consent: I have reviewed the patients History and Physical, chart, labs and discussed the procedure including the risks, benefits and alternatives for the proposed anesthesia with the patient or authorized representative who has indicated his/her understanding and acceptance.   Dental advisory given  Plan Discussed with: CRNA, Surgeon and Anesthesiologist  Anesthesia Plan Comments:        Anesthesia Quick Evaluation

## 2018-02-06 NOTE — Anesthesia Postprocedure Evaluation (Signed)
Anesthesia Post Note  Patient: Hannah Schultz  Procedure(s) Performed: INCISION AND DRAINAGE vulvar ABSCESS (N/A Vulva)     Patient location during evaluation: PACU Anesthesia Type: General Level of consciousness: awake and alert Pain management: pain level controlled Vital Signs Assessment: post-procedure vital signs reviewed and stable Respiratory status: spontaneous breathing, nonlabored ventilation, respiratory function stable and patient connected to nasal cannula oxygen Cardiovascular status: blood pressure returned to baseline and stable Postop Assessment: no apparent nausea or vomiting Anesthetic complications: no    Last Vitals:  Vitals:   02/06/18 1423 02/06/18 1445  BP: (!) 137/104 129/82  Pulse: 79 67  Resp: 16 (!) 23  Temp: 36.8 C   SpO2: 90% 93%    Last Pain:  Vitals:   02/06/18 1318  TempSrc:   PainSc: 4    Pain Goal: Patients Stated Pain Goal: 2 (02/06/18 1318)               Jayquan Bradsher

## 2018-02-06 NOTE — Progress Notes (Addendum)
Subjective: Patient reports still having vulvar pain - somewhat controlled with IV PCA. no problems voiding, no f/c, CP or SOB.    Objective: I have reviewed patient's vital signs, medications and labs.  General: alert and cooperative GI: normal findings: soft, non-tender Extremities: extremities normal, atraumatic, no cyanosis or edema PV - large fluctuant, tender vulvar mass w/ erythema   Results for orders placed or performed during the hospital encounter of 02/05/18 (from the past 24 hour(s))  Glucose, capillary     Status: Abnormal   Collection Time: 02/05/18  4:04 PM  Result Value Ref Range   Glucose-Capillary 173 (H) 65 - 99 mg/dL  CBC     Status: Abnormal   Collection Time: 02/05/18  5:05 PM  Result Value Ref Range   WBC 6.9 4.0 - 10.5 K/uL   RBC 4.24 3.87 - 5.11 MIL/uL   Hemoglobin 11.5 (L) 12.0 - 15.0 g/dL   HCT 34.3 (L) 36.0 - 46.0 %   MCV 80.9 78.0 - 100.0 fL   MCH 27.1 26.0 - 34.0 pg   MCHC 33.5 30.0 - 36.0 g/dL   RDW 13.8 11.5 - 15.5 %   Platelets 291 150 - 400 K/uL  Comprehensive metabolic panel     Status: Abnormal   Collection Time: 02/05/18  5:05 PM  Result Value Ref Range   Sodium 133 (L) 135 - 145 mmol/L   Potassium 4.7 3.5 - 5.1 mmol/L   Chloride 98 (L) 101 - 111 mmol/L   CO2 23 22 - 32 mmol/L   Glucose, Bld 162 (H) 65 - 99 mg/dL   BUN 24 (H) 6 - 20 mg/dL   Creatinine, Ser 1.69 (H) 0.44 - 1.00 mg/dL   Calcium 9.4 8.9 - 10.3 mg/dL   Total Protein 8.4 (H) 6.5 - 8.1 g/dL   Albumin 3.4 (L) 3.5 - 5.0 g/dL   AST 20 15 - 41 U/L   ALT 15 14 - 54 U/L   Alkaline Phosphatase 70 38 - 126 U/L   Total Bilirubin 0.6 0.3 - 1.2 mg/dL   GFR calc non Af Amer 33 (L) >60 mL/min   GFR calc Af Amer 39 (L) >60 mL/min   Anion gap 12 5 - 15  Hemoglobin A1c     Status: Abnormal   Collection Time: 02/05/18  5:05 PM  Result Value Ref Range   Hgb A1c MFr Bld 9.5 (H) 4.8 - 5.6 %   Mean Plasma Glucose 225.95 mg/dL  Glucose, capillary     Status: Abnormal   Collection Time:  02/05/18 10:23 PM  Result Value Ref Range   Glucose-Capillary 198 (H) 65 - 99 mg/dL  Glucose, capillary     Status: Abnormal   Collection Time: 02/06/18  7:48 AM  Result Value Ref Range   Glucose-Capillary 149 (H) 65 - 99 mg/dL     Assessment/Plan: Vulvar abscess - continue IV vancomycin.  Plan for I&D 2pm today BG - monitor BG Q4 hours.  Discussed elevated HgbA1c and need for BG control postop  LOS: 1 day    Hannah Schultz 02/06/2018, 8:26 AM

## 2018-02-06 NOTE — Progress Notes (Signed)
Patient prepped for surgery. PCA D'Cd 80ml WIS

## 2018-02-06 NOTE — Transfer of Care (Signed)
Immediate Anesthesia Transfer of Care Note  Patient: Hannah Schultz  Procedure(s) Performed: INCISION AND DRAINAGE vulvar ABSCESS (N/A Vulva)  Patient Location: PACU  Anesthesia Type:General  Level of Consciousness: awake, alert  and oriented  Airway & Oxygen Therapy: Patient Spontanous Breathing and Patient connected to nasal cannula oxygen  Post-op Assessment: Report given to RN and Post -op Vital signs reviewed and stable  Post vital signs: Reviewed and stable  Last Vitals:  Vitals:   02/06/18 0826 02/06/18 1318  BP: 127/68 123/71  Pulse: (!) 59 61  Resp: 16 16  Temp: 36.6 C 36.8 C  SpO2: 100% 95%    Last Pain:  Vitals:   02/06/18 1318  TempSrc:   PainSc: 4       Patients Stated Pain Goal: 2 (78/58/85 0277)  Complications: No apparent anesthesia complications

## 2018-02-06 NOTE — Anesthesia Procedure Notes (Signed)
Procedure Name: LMA Insertion Date/Time: 02/06/2018 1:46 PM Performed by: Bufford Spikes, CRNA Pre-anesthesia Checklist: Patient identified, Emergency Drugs available, Suction available and Patient being monitored Patient Re-evaluated:Patient Re-evaluated prior to induction Oxygen Delivery Method: Circle system utilized Preoxygenation: Pre-oxygenation with 100% oxygen Induction Type: IV induction Ventilation: Mask ventilation without difficulty LMA: LMA inserted LMA Size: 4.0 Number of attempts: 1 Placement Confirmation: positive ETCO2 Tube secured with: Tape Dental Injury: Teeth and Oropharynx as per pre-operative assessment

## 2018-02-06 NOTE — Progress Notes (Signed)
Inpatient Diabetes Program Recommendations  AACE/ADA: New Consensus Statement on Inpatient Glycemic Control (2015)  Target Ranges:  Prepandial:   less than 140 mg/dL      Peak postprandial:   less than 180 mg/dL (1-2 hours)      Critically ill patients:  140 - 180 mg/dL  Results for Hannah Schultz, Hannah Schultz (MRN [id number]) as of 02/06/2018 10:03  Ref. Range 02/05/2018 16:04 02/05/2018 22:23 02/06/2018 07:48  Glucose-Capillary Latest Ref Range: 65 - 99 mg/dL 173 (H) 198 (H) 149 (H)   Results for Hannah Schultz, Hannah Schultz (MRN [id number]) as of 02/06/2018 10:03  Ref. Range 02/05/2018 17:05  Hemoglobin A1C Latest Ref Range: 4.8 - 5.6 % 9.5 (H)   Review of Glycemic Control  Diabetes history: DM2 Outpatient Diabetes medications: Novolin R 10-20 units TID with meals, Novolin N 70 units with breakfast, Novolin N 70 units with lunch, Novolin N 50 units QHS, Januvia 100 mg daily Current orders for Inpatient glycemic control: Tradjenta 5 mg daily, Novolog 0-20 units TID with meals  Inpatient Diabetes Program Recommendations:  Insulin - Basal: May need to order basal insulin if glucose is consistently greater than 180 mg/dl. HgbA1C: A1C 9.5% on 02/05/18 indicating an average glucose of 226 mg/dl over the past 2-3 months.  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 Patient reports that she is followed by Dr. Chalmers Cater for diabetes management and currently she takes Novolin R 10-20 units TID with meals, Novolin N 70 units with breakfast, Novolin N 70 units with lunch, Novolin N 50 units QHS, Januvia 100 mg daily as an outpatient for diabetes control. Patient reports that she is taking insulin as prescribed. Patient reports that she seen Dr. Chalmers Cater last week and several changes were made with insulin due to her glucose staying higher. Patient reports that she has to take Prednisone chronically for sarcoidosis and her glucose trends  well when she is not on the Prednisone, but she reports that she was told she need to take the Prednisone daily.  Patient reports that since she seen Dr. Chalmers Cater last week and changes were made her glucose has been a little better but still going up through out the day.   Discussed A1C results (9.5% on 02/05/18) and explained that her current A1C indicates an average glucose of 226 mg/dl over the past 2-3 months. Discussed glucose and A1C goals. Discussed importance of checking CBGs and maintaining good CBG control to prevent long-term and short-term complications. Stressed to the patient the importance of improving glycemic control to prevent further complications from uncontrolled diabetes especially following surgery.  Encouraged patient to check her glucose 3-4 times per day (before meals and at bedtime) and to keep a log book of glucose readings and insulin taken. Patient states that she has a follow up appointment with Dr. Chalmers Cater in April. Encouraged patient to reach out to Dr. Chalmers Cater before then if her glucose was consistently staying above 180 mg/dl so she can get her recommendations for any other insulin dose changes. Patient verbalized understanding of information discussed and she states that she has no further questions at this time related to diabetes.  Thanks, Barnie Alderman, RN, MSN, CDE Diabetes Coordinator Inpatient Diabetes Program [phone] (Team Pager)

## 2018-02-07 ENCOUNTER — Encounter (HOSPITAL_COMMUNITY): Payer: Self-pay | Admitting: Obstetrics and Gynecology

## 2018-02-07 DIAGNOSIS — N764 Abscess of vulva: Secondary | ICD-10-CM | POA: Diagnosis present

## 2018-02-07 LAB — GLUCOSE, CAPILLARY
GLUCOSE-CAPILLARY: 121 mg/dL — AB (ref 65–99)
GLUCOSE-CAPILLARY: 113 mg/dL — AB (ref 65–99)

## 2018-02-07 MED ORDER — OXYCODONE-ACETAMINOPHEN 5-325 MG PO TABS: 1.0000 | ORAL_TABLET | ORAL | 0 refills | Status: DC | PRN

## 2018-02-07 MED ORDER — INSULIN NPH (HUMAN) (ISOPHANE) 100 UNIT/ML ~~LOC~~ SUSP: 60.0000 [IU] | Freq: Every day | SUBCUTANEOUS | Status: DC

## 2018-02-07 MED ORDER — INSULIN NPH (HUMAN) (ISOPHANE) 100 UNIT/ML ~~LOC~~ SUSP
60.0000 [IU] | Freq: Every day | SUBCUTANEOUS | Status: DC
  Administered 2018-02-07: 60 [IU] via SUBCUTANEOUS
  Filled 2018-02-07: qty 10

## 2018-02-07 MED ORDER — SULFAMETHOXAZOLE-TRIMETHOPRIM 800-160 MG PO TABS: 1.0000 | ORAL_TABLET | Freq: Two times a day (BID) | ORAL | 0 refills | Status: DC

## 2018-02-07 NOTE — Progress Notes (Signed)
Pt doing great overnight.  Pain controlled, tolerating regular diet  AF, VSS BG 140-180s Gen - NAD Abd - soft, NT/ND PV - packing removed, no active bleeding 2 inch iodoform packing used to repack abscess cavity Pt tolerated well   POD#1 s/P I&D of vulvar abscess Home health for packing changes 1-2x/day F/u in office 1w Encouraged tight BG control

## 2018-02-07 NOTE — Discharge Summary (Signed)
Physician Discharge Summary  Patient ID: Hannah Schultz MRN: [id number] DOB/AGE: [birth year] 55 y.o.  Admit date: 02/05/2018 Discharge date: 02/07/2018  Admission Diagnoses: vulvar abscess  Discharge Diagnoses:  Active Problems:   Vulvar abscess   Discharged Condition: stable  Hospital Course: Pt was admitted for IV antibiotics after symptoms worsened despite oral antibiotics.  After a few doses of antibiotics, I&D was recommended and pt agreed to proceed.  She tolerated the procedure well and was observed overnight.  On POD#1, she was tolerating a regular diet, ambulating and voiding without problems.  She was given insulin for BG control.    Consults: None  Significant Diagnostic Studies: labs: cbc, cmet, HgbA1C  Treatments: IV hydration, antibiotics: vancomycin, insulin: regular and NPH and surgery: I&D  Discharge Exam: Blood pressure 122/71, pulse 72, temperature 98.6 F (37 C), temperature source Oral, resp. rate 16, height $RemoveBe'5\' 5"'aLbrINRvR$  (1.651 m), weight 257 lb (116.6 kg), SpO2 94 %.   Disposition: 01-Home or Self Care  Discharge Instructions    Call MD for:  persistant nausea and vomiting   Complete by:  As directed    Call MD for:  temperature >100.4   Complete by:  As directed    Diet - low sodium heart healthy   Complete by:  As directed    Increase activity slowly   Complete by:  As directed      Allergies as of 02/07/2018      Reactions   Adhesive [tape] Other (See Comments)   Adhesive tape "tears skin"      Medication List    TAKE these medications   amLODipine 5 MG tablet Commonly known as:  NORVASC Take 5 mg by mouth daily.   FLUoxetine 20 MG tablet Commonly known as:  PROZAC Take 20 mg by mouth daily.   glucose blood test strip Commonly known as:  ONE TOUCH ULTRA TEST 3 times per day   insulin regular 100 units/mL injection Commonly known as:  NOVOLIN R,HUMULIN R Inject 10-20 Units into the skin 3 (three) times daily before meals. Based on sliding  scale   metoprolol succinate 50 MG 24 hr tablet Commonly known as:  TOPROL-XL Take 50 mg by mouth daily.   NOVOLIN N RELION 100 UNIT/ML injection Generic drug:  insulin NPH Human Inject 50-70 Units into the skin 3 (three) times daily before meals. 70 units breakfast and lunch, 50 units at bedtime   oxyCODONE-acetaminophen 5-325 MG tablet Commonly known as:  PERCOCET/ROXICET Take 1-2 tablets by mouth every 4 (four) hours as needed for severe pain (once PCA is discontinued and PO intake resumed).   predniSONE 10 MG tablet Commonly known as:  DELTASONE Take 10 mg by mouth daily with breakfast.   PROAIR HFA 108 (90 Base) MCG/ACT inhaler Generic drug:  albuterol Inhale 1-2 puffs into the lungs every 6 (six) hours as needed for wheezing or shortness of breath.   ranitidine 75 MG tablet Commonly known as:  ZANTAC Take 75 mg by mouth at bedtime.   simvastatin 40 MG tablet Commonly known as:  ZOCOR Take 1 tablet (40 mg total) by mouth every evening.   sitaGLIPtin 100 MG tablet Commonly known as:  JANUVIA Take 100 mg by mouth every morning.   sulfamethoxazole-trimethoprim 800-160 MG tablet Commonly known as:  BACTRIM DS,SEPTRA DS Take 1 tablet by mouth 2 (two) times daily. What changed:  Another medication with the same name was added. Make sure you understand how and when to take each.   sulfamethoxazole-trimethoprim  800-160 MG tablet Commonly known as:  BACTRIM DS,SEPTRA DS Take 1 tablet by mouth 2 (two) times daily. What changed:  You were already taking a medication with the same name, and this prescription was added. Make sure you understand how and when to take each.   triamcinolone cream 0.5 % Commonly known as:  KENALOG Apply 1 application topically daily.   Vitamin D3 5000 units Tabs Take 5,000 Units by mouth daily.        Signed: Marylynn Pearson 02/07/2018, 3:56 PM

## 2018-02-08 DIAGNOSIS — Z794 Long term (current) use of insulin: Secondary | ICD-10-CM | POA: Diagnosis not present

## 2018-02-08 DIAGNOSIS — Z4801 Encounter for change or removal of surgical wound dressing: Secondary | ICD-10-CM | POA: Diagnosis not present

## 2018-02-08 DIAGNOSIS — E785 Hyperlipidemia, unspecified: Secondary | ICD-10-CM | POA: Diagnosis not present

## 2018-02-08 DIAGNOSIS — N764 Abscess of vulva: Secondary | ICD-10-CM | POA: Diagnosis not present

## 2018-02-08 DIAGNOSIS — Z7952 Long term (current) use of systemic steroids: Secondary | ICD-10-CM | POA: Diagnosis not present

## 2018-02-08 DIAGNOSIS — I10 Essential (primary) hypertension: Secondary | ICD-10-CM | POA: Diagnosis not present

## 2018-02-08 DIAGNOSIS — K219 Gastro-esophageal reflux disease without esophagitis: Secondary | ICD-10-CM | POA: Diagnosis not present

## 2018-02-08 DIAGNOSIS — Z79891 Long term (current) use of opiate analgesic: Secondary | ICD-10-CM | POA: Diagnosis not present

## 2018-02-08 DIAGNOSIS — F329 Major depressive disorder, single episode, unspecified: Secondary | ICD-10-CM | POA: Diagnosis not present

## 2018-02-08 DIAGNOSIS — E119 Type 2 diabetes mellitus without complications: Secondary | ICD-10-CM | POA: Diagnosis not present

## 2018-02-08 DIAGNOSIS — Z7951 Long term (current) use of inhaled steroids: Secondary | ICD-10-CM | POA: Diagnosis not present

## 2018-02-08 NOTE — Op Note (Signed)
NAMELEASA, KINCANNON NO.:  [id number]  MEDICAL RECORD NO.:  [id number]  LOCATION:                                 FACILITY:  PHYSICIAN:  Marylynn Pearson, MD         DATE OF BIRTH:  DATE OF PROCEDURE:  02/06/2018 DATE OF DISCHARGE:                              OPERATIVE REPORT   PREOPERATIVE DIAGNOSIS:  Vulvar abscess.  POSTOPERATIVE DIAGNOSIS:  Vulvar abscess.  PROCEDURE:  Incision and drainage of vulvar abscess.  SURGEON:  Marylynn Pearson, MD  ANESTHESIA:  General.  DESCRIPTION OF PROCEDURE:  The patient was taken to the operating room, where general anesthesia was obtained.  She was placed in the dorsal lithotomy position using Allen stirrups.  She was prepped and draped in sterile fashion.  In-and-out catheter was used to drain her bladder.  A 2.5 cm incision was made in the anterior-posterior direction and copious amounts of purulent fluid was evacuated from the vulvar abscess. Abscess was then irrigated with sterile saline and packed with 2-inch iodoform.  She was then extubated and taken to the recovery room in stable condition.  Sponge, lap, needle, and instrument counts were correct x2.     Marylynn Pearson, MD     GA/MEDQ  D:  02/07/2018  T:  02/08/2018  Job:  [id number]

## 2018-02-09 LAB — AEROBIC CULTURE W GRAM STAIN (SUPERFICIAL SPECIMEN)

## 2018-02-10 NOTE — Op Note (Signed)
NAMEKORINNA, TAT NO.:  [id number]  MEDICAL RECORD NO.:  [id number]  LOCATION:                                 FACILITY:  PHYSICIAN:  Marylynn Pearson, MD    DATE OF BIRTH:  05-May-1961  DATE OF PROCEDURE: DATE OF DISCHARGE:  02/07/2018                              OPERATIVE REPORT   PREOPERATIVE DIAGNOSIS:  Vulvar abscess.  POSTOPERATIVE DIAGNOSIS:  Vulvar abscess.  PROCEDURE:  Incision and drainage of vulvar abscess.  SURGEON:  Marylynn Pearson, MD.  ANESTHESIA:  General.  COMPLICATIONS:  None.  CONDITION:  Stable to recovery room.  PROCEDURE IN DETAIL:  The patient was taken to the operating room where she was given general anesthesia.  She was placed in the dorsal lithotomy position in the [place] stirrups, prepped and draped sterilely, and in-and-out catheter was used to drain her bladder.  A 2.5 cm incision was made over the vulvar abscess in the anterior-posterior plane and copious amounts of purulent drainage were evacuated from the abscess.  The abscess was irrigated with sterile normal saline and packed with iodoform gauze.  She was extubated and taken to the recovery room in stable condition.     Marylynn Pearson, MD     GA/MEDQ  D:  02/06/2018  T:  02/07/2018  Job:  [id number]

## 2018-02-11 LAB — ANAEROBIC CULTURE

## 2018-02-18 DIAGNOSIS — I10 Essential (primary) hypertension: Secondary | ICD-10-CM | POA: Diagnosis not present

## 2018-02-18 DIAGNOSIS — Z7951 Long term (current) use of inhaled steroids: Secondary | ICD-10-CM | POA: Diagnosis not present

## 2018-02-18 DIAGNOSIS — E119 Type 2 diabetes mellitus without complications: Secondary | ICD-10-CM | POA: Diagnosis not present

## 2018-02-18 DIAGNOSIS — Z7952 Long term (current) use of systemic steroids: Secondary | ICD-10-CM | POA: Diagnosis not present

## 2018-02-18 DIAGNOSIS — N764 Abscess of vulva: Secondary | ICD-10-CM | POA: Diagnosis not present

## 2018-02-24 DIAGNOSIS — G4733 Obstructive sleep apnea (adult) (pediatric): Secondary | ICD-10-CM | POA: Diagnosis not present

## 2018-02-24 DIAGNOSIS — S3140XA Unspecified open wound of vagina and vulva, initial encounter: Secondary | ICD-10-CM | POA: Diagnosis not present

## 2018-04-11 DIAGNOSIS — F332 Major depressive disorder, recurrent severe without psychotic features: Secondary | ICD-10-CM | POA: Diagnosis not present

## 2018-04-23 DIAGNOSIS — E119 Type 2 diabetes mellitus without complications: Secondary | ICD-10-CM | POA: Diagnosis not present

## 2018-04-23 DIAGNOSIS — D869 Sarcoidosis, unspecified: Secondary | ICD-10-CM | POA: Diagnosis not present

## 2018-04-23 DIAGNOSIS — R079 Chest pain, unspecified: Secondary | ICD-10-CM | POA: Diagnosis not present

## 2018-04-23 DIAGNOSIS — I10 Essential (primary) hypertension: Secondary | ICD-10-CM | POA: Diagnosis not present

## 2018-06-04 DIAGNOSIS — E1140 Type 2 diabetes mellitus with diabetic neuropathy, unspecified: Secondary | ICD-10-CM | POA: Diagnosis not present

## 2018-06-04 DIAGNOSIS — D860 Sarcoidosis of lung: Secondary | ICD-10-CM | POA: Diagnosis not present

## 2018-06-04 DIAGNOSIS — I10 Essential (primary) hypertension: Secondary | ICD-10-CM | POA: Diagnosis not present

## 2018-06-24 DIAGNOSIS — E1140 Type 2 diabetes mellitus with diabetic neuropathy, unspecified: Secondary | ICD-10-CM | POA: Diagnosis not present

## 2018-06-24 DIAGNOSIS — D860 Sarcoidosis of lung: Secondary | ICD-10-CM | POA: Diagnosis not present

## 2018-06-24 DIAGNOSIS — E1165 Type 2 diabetes mellitus with hyperglycemia: Secondary | ICD-10-CM | POA: Diagnosis not present

## 2018-06-24 DIAGNOSIS — E7800 Pure hypercholesterolemia, unspecified: Secondary | ICD-10-CM | POA: Diagnosis not present

## 2018-06-24 DIAGNOSIS — I10 Essential (primary) hypertension: Secondary | ICD-10-CM | POA: Diagnosis not present

## 2018-07-07 DIAGNOSIS — H01024 Squamous blepharitis left upper eyelid: Secondary | ICD-10-CM | POA: Diagnosis not present

## 2018-07-07 DIAGNOSIS — H25813 Combined forms of age-related cataract, bilateral: Secondary | ICD-10-CM | POA: Diagnosis not present

## 2018-07-07 DIAGNOSIS — H01022 Squamous blepharitis right lower eyelid: Secondary | ICD-10-CM | POA: Diagnosis not present

## 2018-07-07 DIAGNOSIS — H01021 Squamous blepharitis right upper eyelid: Secondary | ICD-10-CM | POA: Diagnosis not present

## 2018-07-07 DIAGNOSIS — E119 Type 2 diabetes mellitus without complications: Secondary | ICD-10-CM | POA: Diagnosis not present

## 2018-07-07 DIAGNOSIS — H01025 Squamous blepharitis left lower eyelid: Secondary | ICD-10-CM | POA: Diagnosis not present

## 2018-07-29 DIAGNOSIS — M25511 Pain in right shoulder: Secondary | ICD-10-CM | POA: Diagnosis not present

## 2018-07-29 DIAGNOSIS — I10 Essential (primary) hypertension: Secondary | ICD-10-CM | POA: Diagnosis not present

## 2018-07-29 DIAGNOSIS — E1140 Type 2 diabetes mellitus with diabetic neuropathy, unspecified: Secondary | ICD-10-CM | POA: Diagnosis not present

## 2018-07-30 ENCOUNTER — Other Ambulatory Visit (HOSPITAL_COMMUNITY): Payer: Self-pay | Admitting: Pulmonary Disease

## 2018-07-30 ENCOUNTER — Ambulatory Visit (HOSPITAL_COMMUNITY)
Admission: RE | Admit: 2018-07-30 | Discharge: 2018-07-30 | Disposition: A | Discharge status: Home / Self Care | Payer: PPO | Source: Ambulatory Visit | Attending: Pulmonary Disease | Admitting: Pulmonary Disease

## 2018-07-30 DIAGNOSIS — M5440 Lumbago with sciatica, unspecified side: Secondary | ICD-10-CM

## 2018-07-30 DIAGNOSIS — M25511 Pain in right shoulder: Secondary | ICD-10-CM | POA: Diagnosis not present

## 2018-07-30 DIAGNOSIS — M47814 Spondylosis without myelopathy or radiculopathy, thoracic region: Secondary | ICD-10-CM | POA: Diagnosis not present

## 2018-07-30 DIAGNOSIS — M47894 Other spondylosis, thoracic region: Secondary | ICD-10-CM | POA: Diagnosis not present

## 2018-07-30 DIAGNOSIS — M47896 Other spondylosis, lumbar region: Secondary | ICD-10-CM | POA: Diagnosis not present

## 2018-07-30 DIAGNOSIS — M546 Pain in thoracic spine: Secondary | ICD-10-CM

## 2018-07-30 DIAGNOSIS — M47816 Spondylosis without myelopathy or radiculopathy, lumbar region: Secondary | ICD-10-CM | POA: Diagnosis not present

## 2018-07-30 DIAGNOSIS — M19011 Primary osteoarthritis, right shoulder: Secondary | ICD-10-CM | POA: Diagnosis not present

## 2018-08-04 ENCOUNTER — Other Ambulatory Visit (HOSPITAL_COMMUNITY): Payer: Self-pay | Admitting: Pulmonary Disease

## 2018-08-04 DIAGNOSIS — Z1231 Encounter for screening mammogram for malignant neoplasm of breast: Secondary | ICD-10-CM

## 2018-08-26 DIAGNOSIS — E1121 Type 2 diabetes mellitus with diabetic nephropathy: Secondary | ICD-10-CM | POA: Diagnosis not present

## 2018-08-26 DIAGNOSIS — I129 Hypertensive chronic kidney disease with stage 1 through stage 4 chronic kidney disease, or unspecified chronic kidney disease: Secondary | ICD-10-CM | POA: Diagnosis not present

## 2018-08-26 DIAGNOSIS — F332 Major depressive disorder, recurrent severe without psychotic features: Secondary | ICD-10-CM | POA: Diagnosis not present

## 2018-08-26 DIAGNOSIS — D860 Sarcoidosis of lung: Secondary | ICD-10-CM | POA: Diagnosis not present

## 2018-08-26 DIAGNOSIS — Z23 Encounter for immunization: Secondary | ICD-10-CM | POA: Diagnosis not present

## 2018-09-05 ENCOUNTER — Other Ambulatory Visit: Payer: Self-pay | Admitting: Pulmonary Disease

## 2018-09-05 DIAGNOSIS — M79604 Pain in right leg: Secondary | ICD-10-CM

## 2018-09-08 ENCOUNTER — Ambulatory Visit (HOSPITAL_COMMUNITY)
Admission: RE | Admit: 2018-09-08 | Discharge: 2018-09-08 | Disposition: A | Discharge status: Home / Self Care | Payer: PPO | Source: Ambulatory Visit | Attending: Pulmonary Disease | Admitting: Pulmonary Disease

## 2018-09-08 DIAGNOSIS — Z1231 Encounter for screening mammogram for malignant neoplasm of breast: Secondary | ICD-10-CM

## 2018-09-16 ENCOUNTER — Ambulatory Visit
Admission: RE | Admit: 2018-09-16 | Discharge: 2018-09-16 | Disposition: A | Discharge status: Home / Self Care | Payer: PPO | Source: Ambulatory Visit | Attending: Pulmonary Disease | Admitting: Pulmonary Disease

## 2018-09-16 DIAGNOSIS — M545 Low back pain: Secondary | ICD-10-CM | POA: Diagnosis not present

## 2018-09-16 DIAGNOSIS — M79604 Pain in right leg: Secondary | ICD-10-CM

## 2018-09-18 ENCOUNTER — Ambulatory Visit: Payer: PPO | Admitting: Cardiology

## 2018-09-18 ENCOUNTER — Encounter: Payer: Self-pay | Admitting: Cardiology

## 2018-09-18 VITALS — BP 132/80 | HR 81 | Ht 65.0 in | Wt 267.0 lb

## 2018-09-18 DIAGNOSIS — R079 Chest pain, unspecified: Secondary | ICD-10-CM | POA: Diagnosis not present

## 2018-09-18 DIAGNOSIS — Z136 Encounter for screening for cardiovascular disorders: Secondary | ICD-10-CM

## 2018-09-18 DIAGNOSIS — I10 Essential (primary) hypertension: Secondary | ICD-10-CM

## 2018-09-18 NOTE — Progress Notes (Signed)
Clinical Summary Ms. Rison is a 57 y.o.female seen as new consult, referred by Dr Luan Pulling for chest pain  1. Chest pain - started about 3-4 months ago. Pressure pain left side. Can occur any time. 4/10 in severity, can get SOB. Can be worst with position. Lasts about 10 minutes. Occurs sporadically - highest level of activity is housework, limited back. Can have some SOB with activities.  - back pain between shoulder blades and down, recent MRI awaiting results. She reports her chest pain is different from the pain in her back  CAD risk factors: DM2, HL, HTN, no smoking, father MI x2 first one late 74s, mother MI x2 first age 51, sister MI age 43.   Chronic back pain, unable to run on treadmill      2. 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 Acid reflux   . Arthritis   . Bulging lumbar disc   . Depression   . Diabetes mellitus   . Diabetic neuropathy ([place])   . High cholesterol   . Hypertension   . Post traumatic stress disorder (PTSD)   . Sleep apnea      Allergies  Allergen Reactions  . Adhesive [Tape] Other (See Comments)    Adhesive tape "tears skin"     Current Outpatient Medications  Medication Sig Dispense Refill  . amLODipine (NORVASC) 5 MG tablet Take 5 mg by mouth daily.     . Cholecalciferol (VITAMIN D3) 5000 UNITS TABS Take 5,000 Units by mouth daily.     Marland Kitchen FLUoxetine (PROZAC) 20 MG tablet Take 20 mg by mouth daily.    Marland Kitchen glucose blood (ONE TOUCH ULTRA TEST) test strip 3 times per day 100 each 0  . insulin regular (NOVOLIN R,HUMULIN R) 100 units/mL injection Inject 10-20 Units into the skin 3 (three) times daily before meals. Based on sliding scale    . metoprolol succinate (TOPROL-XL) 50 MG 24 hr tablet Take 50 mg by mouth daily.    Marland Kitchen NOVOLIN N RELION 100 UNIT/ML injection Inject 50-70 Units into the skin 3 (three) times daily before meals. 70 units breakfast and lunch, 50 units at bedtime    .  oxyCODONE-acetaminophen (PERCOCET/ROXICET) 5-325 MG tablet Take 1-2 tablets by mouth every 4 (four) hours as needed for severe pain (once PCA is discontinued and PO intake resumed). 45 tablet 0  . predniSONE (DELTASONE) 10 MG tablet Take 10 mg by mouth daily with breakfast.     . PROAIR HFA 108 (90 Base) MCG/ACT inhaler Inhale 1-2 puffs into the lungs every 6 (six) hours as needed for wheezing or shortness of breath.     . ranitidine (ZANTAC) 75 MG tablet Take 75 mg by mouth at bedtime.    . simvastatin (ZOCOR) 40 MG tablet Take 1 tablet (40 mg total) by mouth every evening. 30 tablet 5  . sitaGLIPtin (JANUVIA) 100 MG tablet Take 100 mg by mouth every morning.     . sulfamethoxazole-trimethoprim (BACTRIM DS,SEPTRA DS) 800-160 MG tablet Take 1 tablet by mouth 2 (two) times daily.    Marland Kitchen sulfamethoxazole-trimethoprim (BACTRIM DS,SEPTRA DS) 800-160 MG tablet Take 1 tablet by mouth 2 (two) times daily. 14 tablet 0  . triamcinolone cream (KENALOG) 0.5 % Apply 1 application topically daily.      No current facility-administered medications for this visit.      Past Surgical History:  Procedure Laterality Date  . ABDOMINAL HYSTERECTOMY    .  CHOLECYSTECTOMY    . COLONOSCOPY    . INCISION AND DRAINAGE ABSCESS N/A 02/06/2018   Procedure: INCISION AND DRAINAGE vulvar ABSCESS;  Surgeon: Marylynn Pearson, MD;  Location: [facility];  Service: Gynecology;  Laterality: N/A;  . MEDIASTINOSCOPY N/A 04/25/2017   Procedure: MEDIASTINOSCOPY;  Surgeon: Melrose Nakayama, MD;  Location: [facility];  Service: Thoracic;  Laterality: N/A;  . TONSILLECTOMY  04/02/2012   Procedure: TONSILLECTOMY;  Surgeon: Ascencion Dike, MD;  Location: [facility];  Service: ENT;  Laterality: Bilateral;     Allergies  Allergen Reactions  . Adhesive [Tape] Other (See Comments)    Adhesive tape "tears skin"      Family History  Problem Relation Age of Onset  . Heart disease Unknown   . Cancer Unknown   . Diabetes Unknown      Social  History Ms. Snee reports that she has never smoked. She has never used smokeless tobacco. Ms. Rieger reports that she does not drink alcohol.   Review of Systems CONSTITUTIONAL: No weight loss, fever, chills, weakness or fatigue.  HEENT: Eyes: No visual loss, blurred vision, double vision or yellow sclerae.No hearing loss, sneezing, congestion, runny nose or sore throat.  SKIN: No rash or itching.  CARDIOVASCULAR: per hpi RESPIRATORY: per hpi GASTROINTESTINAL: No anorexia, nausea, vomiting or diarrhea. No abdominal pain or blood.  GENITOURINARY: No burning on urination, no polyuria NEUROLOGICAL: No headache, dizziness, syncope, paralysis, ataxia, numbness or tingling in the extremities. No change in bowel or bladder control.  MUSCULOSKELETAL: No muscle, back pain, joint pain or stiffness.  LYMPHATICS: No enlarged nodes. No history of splenectomy.  PSYCHIATRIC: No history of depression or anxiety.  ENDOCRINOLOGIC: No reports of sweating, cold or heat intolerance. No polyuria or polydipsia.  Marland Kitchen   Physical Examination Vitals:   09/18/18 1447  BP: 132/80  Pulse: 81  SpO2: 94%   Vitals:   09/18/18 1447  Weight: 267 lb (121.1 kg)  Height: $Remove'5\' 5"'AvEBfpF$  (1.651 m)    Gen: resting comfortably, no acute distress HEENT: no scleral icterus, pupils equal round and reactive, no palptable cervical adenopathy,  CV: RRR, no m/r/g, no jvd Resp: Clear to auscultation bilaterally GI: abdomen is soft, non-tender, non-distended, normal bowel sounds, no hepatosplenomegaly MSK: extremities are warm, no edema.  Skin: warm, no rash Neuro:  no focal deficits Psych: appropriate affect      Assessment and Plan  1. Chest pain - multiple CAD risk factors including DM2 and family history - EKG shows SR, no acute ischemic changes - unable to run on treadmill due to chronic back pain. Plan for lexiscan 2 day protocol to further evaluate   2. HTN - at goal, continue current meds     Arnoldo Lenis, M.D.

## 2018-09-18 NOTE — Patient Instructions (Signed)
Medication Instructions:  Your physician recommends that you continue on your current medications as directed. Please refer to the Current Medication list given to you today.  Labwork: NONE  Testing/Procedures: Your physician has requested that you have a lexiscan myoview. For further information please visit [email or web address]. Please follow instruction sheet, as given.    Follow-Up: Your physician recommends that you schedule a follow-up appointment in: PENDING TEST RESULTS    Any Other Special Instructions Will Be Listed Below (If Applicable).     If you need a refill on your cardiac medications before your next appointment, please call your pharmacy.

## 2018-09-25 ENCOUNTER — Encounter (HOSPITAL_COMMUNITY): Admission: RE | Admit: 2018-09-25 | Payer: PPO | Source: Ambulatory Visit

## 2018-09-25 ENCOUNTER — Other Ambulatory Visit (HOSPITAL_COMMUNITY): Payer: PPO

## 2018-09-25 ENCOUNTER — Encounter (HOSPITAL_COMMUNITY)
Admission: RE | Admit: 2018-09-25 | Discharge: 2018-09-25 | Disposition: A | Discharge status: Home / Self Care | Payer: PPO | Source: Ambulatory Visit | Attending: Cardiology | Admitting: Cardiology

## 2018-09-25 ENCOUNTER — Other Ambulatory Visit: Payer: Self-pay | Admitting: Pulmonary Disease

## 2018-09-25 DIAGNOSIS — R079 Chest pain, unspecified: Secondary | ICD-10-CM

## 2018-09-25 DIAGNOSIS — M545 Low back pain, unspecified: Secondary | ICD-10-CM

## 2018-09-25 DIAGNOSIS — G8929 Other chronic pain: Secondary | ICD-10-CM

## 2018-09-26 ENCOUNTER — Encounter (HOSPITAL_COMMUNITY): Payer: PPO

## 2018-10-01 ENCOUNTER — Ambulatory Visit (HOSPITAL_COMMUNITY)
Admission: RE | Admit: 2018-10-01 | Discharge: 2018-10-01 | Disposition: A | Discharge status: Home / Self Care | Payer: PPO | Source: Ambulatory Visit | Attending: Cardiology | Admitting: Cardiology

## 2018-10-01 ENCOUNTER — Encounter (HOSPITAL_BASED_OUTPATIENT_CLINIC_OR_DEPARTMENT_OTHER)
Admission: RE | Admit: 2018-10-01 | Discharge: 2018-10-01 | Disposition: A | Discharge status: Home / Self Care | Payer: PPO | Source: Ambulatory Visit | Attending: Cardiology | Admitting: Cardiology

## 2018-10-01 DIAGNOSIS — R079 Chest pain, unspecified: Secondary | ICD-10-CM | POA: Diagnosis not present

## 2018-10-01 MED ORDER — SODIUM CHLORIDE 0.9% FLUSH
INTRAVENOUS | Status: AC
  Administered 2018-10-01: 10 mL via INTRAVENOUS
  Filled 2018-10-01: qty 10

## 2018-10-01 MED ORDER — TECHNETIUM TC 99M TETROFOSMIN IV KIT
30.0000 | PACK | Freq: Once | INTRAVENOUS | Status: AC | PRN
  Administered 2018-10-01: 32 via INTRAVENOUS

## 2018-10-01 MED ORDER — REGADENOSON 0.4 MG/5ML IV SOLN
INTRAVENOUS | Status: AC
  Administered 2018-10-01: 0.4 mg via INTRAVENOUS
  Filled 2018-10-01: qty 5

## 2018-10-02 ENCOUNTER — Other Ambulatory Visit: Payer: PPO

## 2018-10-02 ENCOUNTER — Encounter (HOSPITAL_COMMUNITY)
Admission: RE | Admit: 2018-10-02 | Discharge: 2018-10-02 | Disposition: A | Discharge status: Home / Self Care | Payer: PPO | Source: Ambulatory Visit | Attending: Cardiology | Admitting: Cardiology

## 2018-10-02 LAB — NM MYOCAR MULTI W/SPECT W/WALL MOTION / EF
CSEPPHR: 81 {beats}/min
Rest HR: 59 {beats}/min

## 2018-10-02 MED ORDER — TECHNETIUM TC 99M TETROFOSMIN IV KIT
30.0000 | PACK | Freq: Once | INTRAVENOUS | Status: AC | PRN
  Administered 2018-10-02: 30.6 via INTRAVENOUS

## 2018-10-03 ENCOUNTER — Telehealth: Payer: Self-pay

## 2018-10-03 NOTE — Telephone Encounter (Signed)
-----   Message from Arnoldo Lenis, MD sent at 10/03/2018  3:54 PM EDT ----- Normal stress test, no evidence of any significant blockages. May f/u as needed   J BrancH DM

## 2018-10-03 NOTE — Telephone Encounter (Signed)
Called pt. No answer. Left message for pt to return call.  

## 2018-10-07 IMAGING — MG DIGITAL SCREENING BILATERAL MAMMOGRAM WITH TOMO AND CAD
6 of 12 series · 6 of 36 positions shown · non-contrast
Comparison: Previous exam(s).

CLINICAL DATA: Screening.

EXAM:
DIGITAL SCREENING BILATERAL MAMMOGRAM WITH TOMO AND CAD

[R MLO synth-2D (1 of 2)]
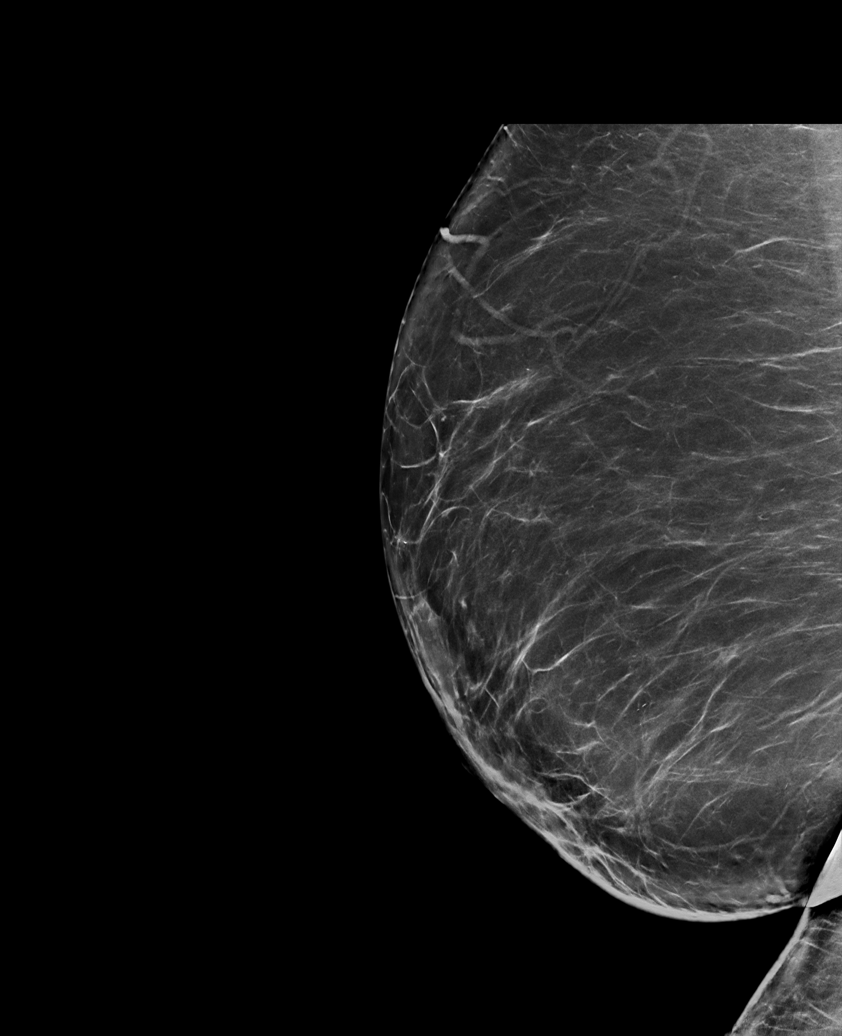

[L MLO synth-2D (1 of 2)]
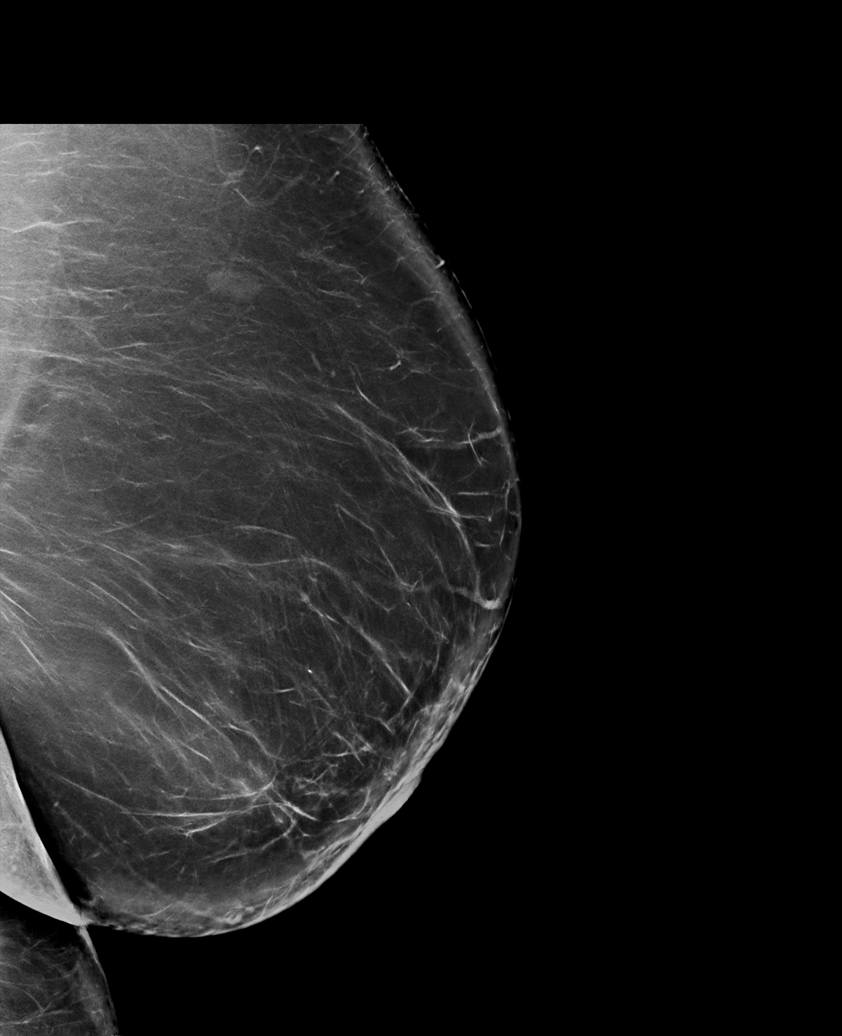

[L CC synth-2D]
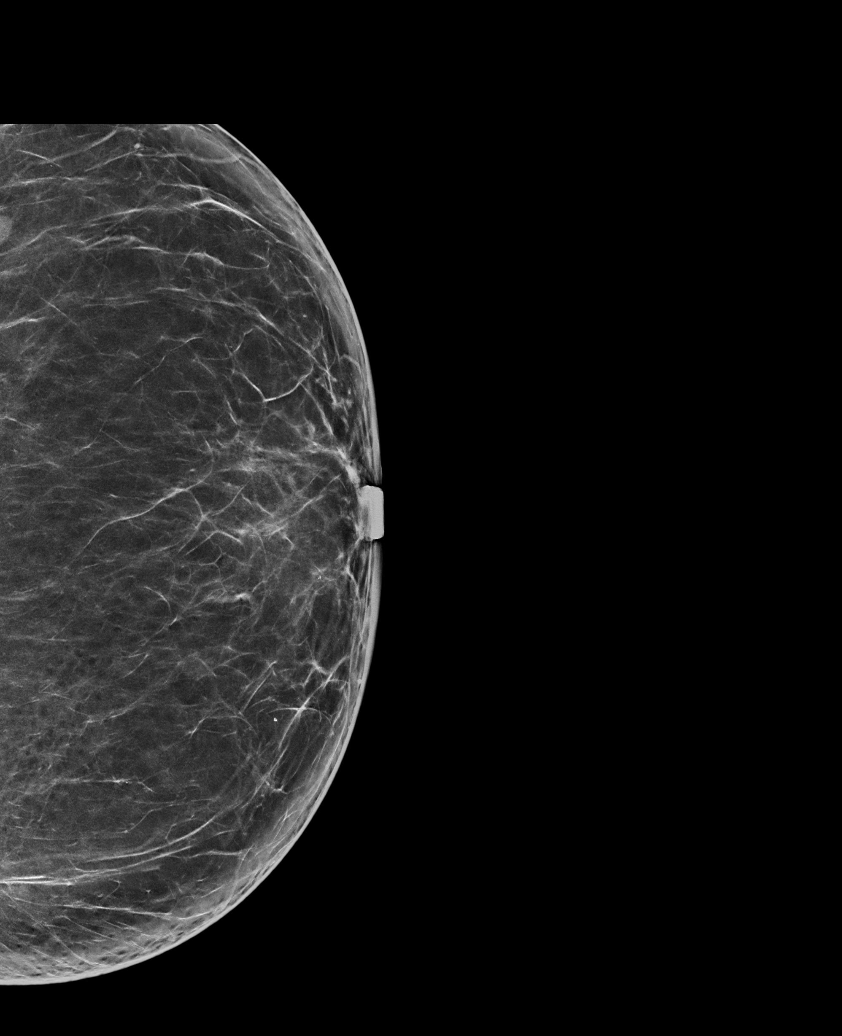

[R MLO synth-2D (2 of 2)]
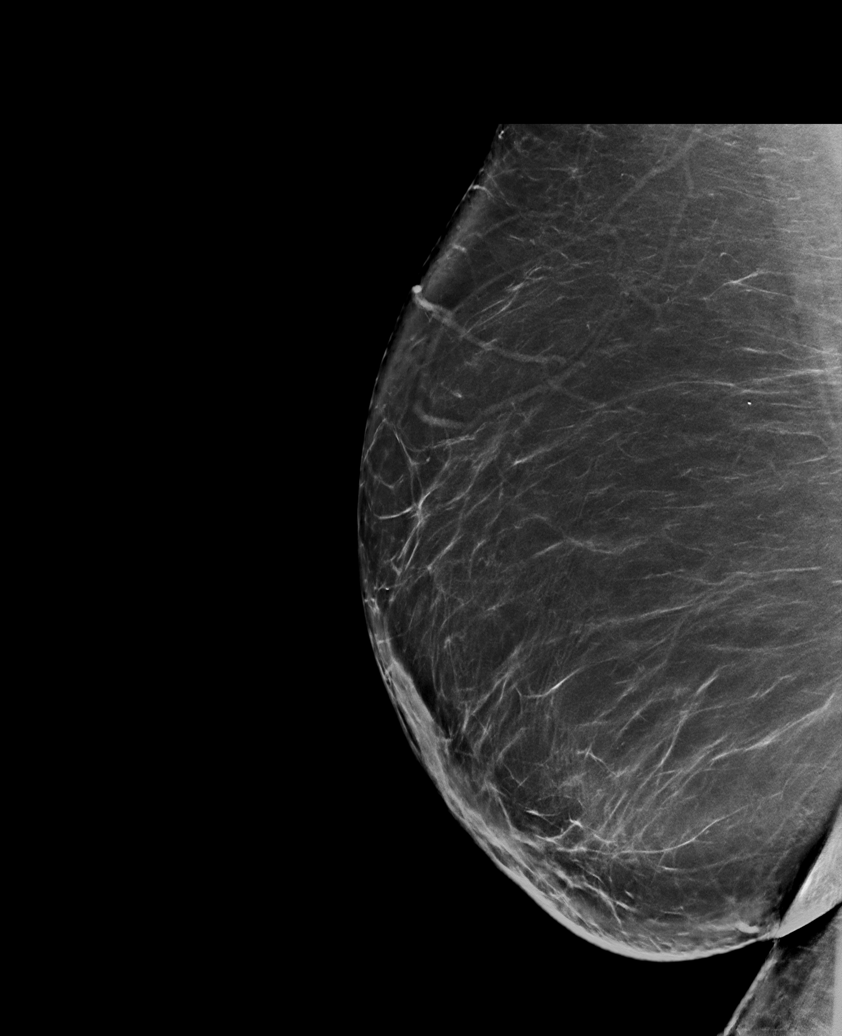

[R CC synth-2D]
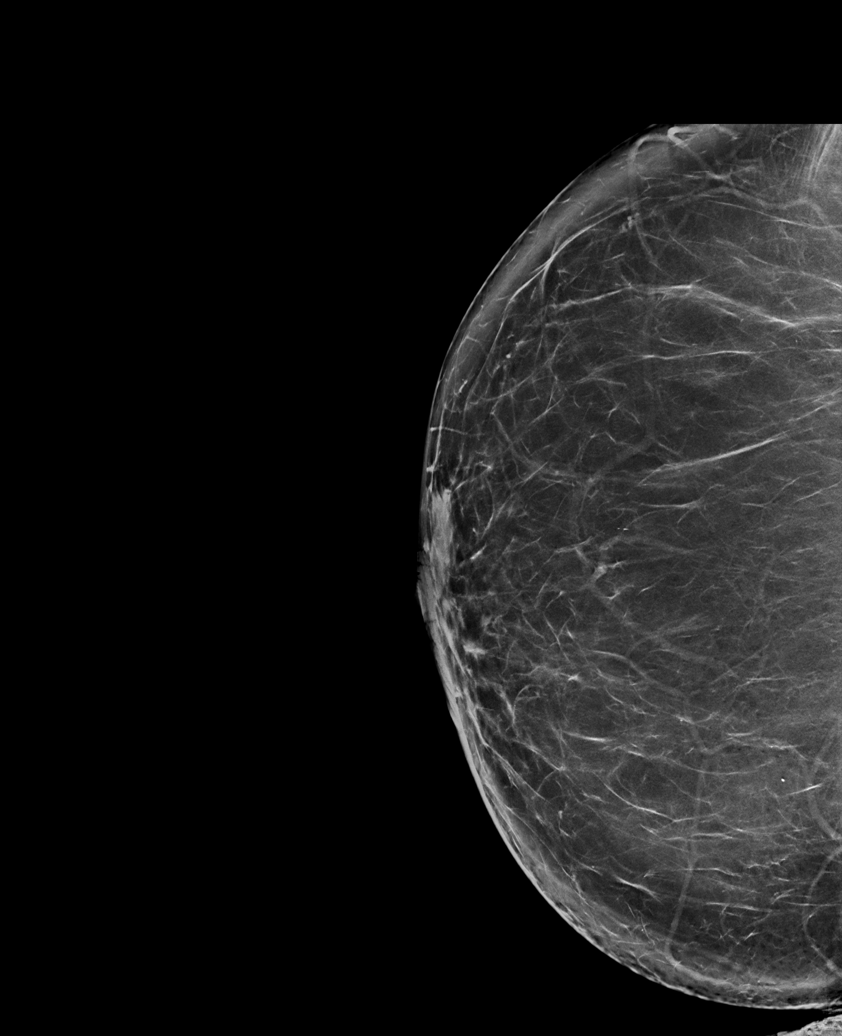

[L MLO synth-2D (2 of 2)]
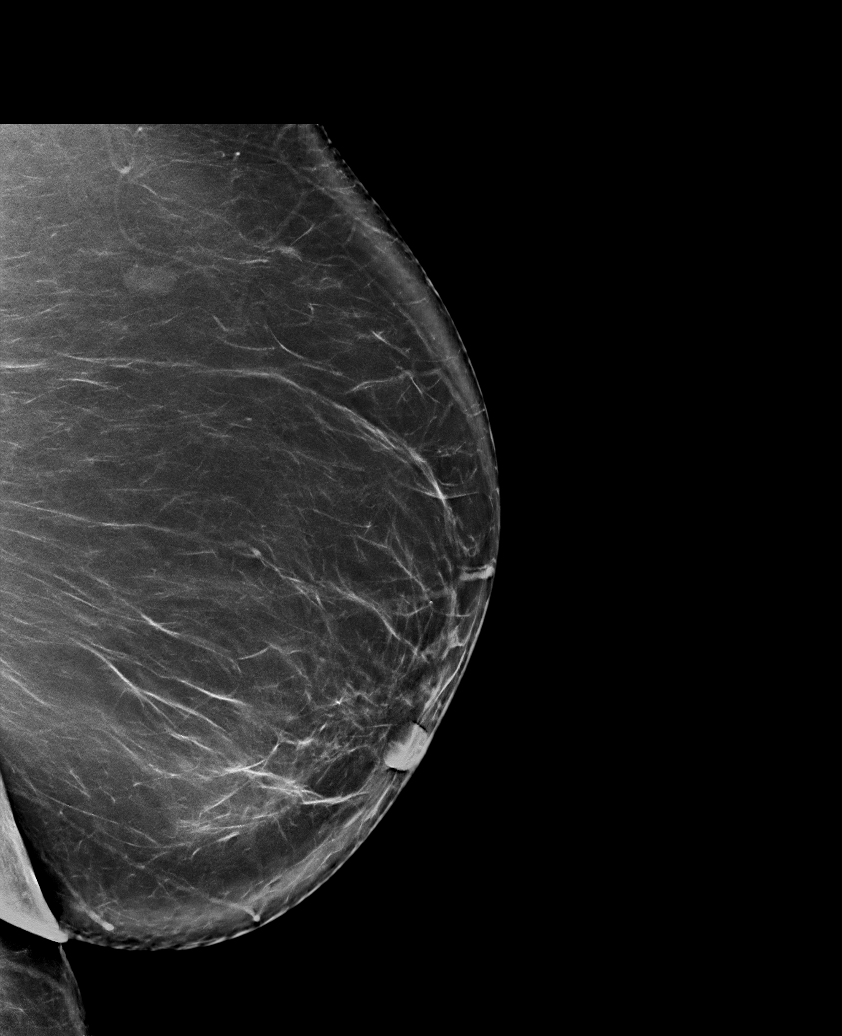

[6 of 36 positions shown; findings below may reference images not displayed]

ACR Breast Density Category b: There are scattered areas of
fibroglandular density.
FINDINGS: There are no findings suspicious for malignancy. Images were
processed with CAD.
IMPRESSION: No mammographic evidence of malignancy. A result letter of this
screening mammogram will be mailed directly to the patient.

RECOMMENDATION:
Screening mammogram in one year. (Code:CN-U-775)

BI-RADS CATEGORY  1: Negative.

## 2018-10-09 ENCOUNTER — Inpatient Hospital Stay
Admission: RE | Admit: 2018-10-09 | Discharge: 2018-10-09 | Disposition: A | Discharge status: Home / Self Care | Payer: PPO | Source: Ambulatory Visit | Attending: Pulmonary Disease | Admitting: Pulmonary Disease

## 2018-10-21 ENCOUNTER — Ambulatory Visit
Admission: RE | Admit: 2018-10-21 | Discharge: 2018-10-21 | Disposition: A | Discharge status: Home / Self Care | Payer: PPO | Source: Ambulatory Visit | Attending: Pulmonary Disease | Admitting: Pulmonary Disease

## 2018-10-21 DIAGNOSIS — G8929 Other chronic pain: Secondary | ICD-10-CM

## 2018-10-21 DIAGNOSIS — M545 Low back pain, unspecified: Secondary | ICD-10-CM

## 2018-10-21 MED ORDER — IOPAMIDOL (ISOVUE-M 200) INJECTION 41%
1.0000 mL | Freq: Once | INTRAMUSCULAR | Status: AC
  Administered 2018-10-21: 1 mL via EPIDURAL

## 2018-10-21 MED ORDER — METHYLPREDNISOLONE ACETATE 40 MG/ML INJ SUSP (RADIOLOG
120.0000 mg | Freq: Once | INTRAMUSCULAR | Status: AC
  Administered 2018-10-21: 120 mg via EPIDURAL

## 2018-10-21 NOTE — Discharge Instructions (Signed)

## 2018-12-09 DIAGNOSIS — Z01419 Encounter for gynecological examination (general) (routine) without abnormal findings: Secondary | ICD-10-CM | POA: Diagnosis not present

## 2018-12-09 DIAGNOSIS — R1032 Left lower quadrant pain: Secondary | ICD-10-CM | POA: Diagnosis not present

## 2018-12-09 DIAGNOSIS — Z6841 Body Mass Index (BMI) 40.0 and over, adult: Secondary | ICD-10-CM | POA: Diagnosis not present

## 2018-12-24 DIAGNOSIS — E1140 Type 2 diabetes mellitus with diabetic neuropathy, unspecified: Secondary | ICD-10-CM | POA: Diagnosis not present

## 2018-12-24 DIAGNOSIS — I10 Essential (primary) hypertension: Secondary | ICD-10-CM | POA: Diagnosis not present

## 2018-12-24 DIAGNOSIS — E7800 Pure hypercholesterolemia, unspecified: Secondary | ICD-10-CM | POA: Diagnosis not present

## 2018-12-24 DIAGNOSIS — E1165 Type 2 diabetes mellitus with hyperglycemia: Secondary | ICD-10-CM | POA: Diagnosis not present

## 2018-12-24 DIAGNOSIS — D860 Sarcoidosis of lung: Secondary | ICD-10-CM | POA: Diagnosis not present

## 2019-02-09 DIAGNOSIS — E1165 Type 2 diabetes mellitus with hyperglycemia: Secondary | ICD-10-CM | POA: Diagnosis not present

## 2019-02-09 DIAGNOSIS — E1140 Type 2 diabetes mellitus with diabetic neuropathy, unspecified: Secondary | ICD-10-CM | POA: Diagnosis not present

## 2019-02-09 DIAGNOSIS — I10 Essential (primary) hypertension: Secondary | ICD-10-CM | POA: Diagnosis not present

## 2019-02-09 DIAGNOSIS — E7800 Pure hypercholesterolemia, unspecified: Secondary | ICD-10-CM | POA: Diagnosis not present

## 2019-03-24 ENCOUNTER — Other Ambulatory Visit: Payer: Self-pay | Admitting: *Deleted

## 2019-03-24 NOTE — Patient Outreach (Signed)
HTA HRA follow up call. No answer, but able to leave a message and requested a return call.  If I do not hear back from her I will place another call.  Hannah Schultz. Myrtie Neither, MSN, [facility] Gerontological Nurse Practitioner [facility] Care Management [phone]

## 2019-03-27 NOTE — Patient Outreach (Signed)
Unsuccessful HTA HRA follow up call. Will try again another day.  Eulah Pont. Myrtie Neither, MSN, [facility] Gerontological Nurse Practitioner [facility] Care Management [phone]

## 2019-05-01 DIAGNOSIS — I129 Hypertensive chronic kidney disease with stage 1 through stage 4 chronic kidney disease, or unspecified chronic kidney disease: Secondary | ICD-10-CM | POA: Diagnosis not present

## 2019-05-01 DIAGNOSIS — M545 Low back pain: Secondary | ICD-10-CM | POA: Diagnosis not present

## 2019-05-01 DIAGNOSIS — D860 Sarcoidosis of lung: Secondary | ICD-10-CM | POA: Diagnosis not present

## 2019-05-01 DIAGNOSIS — F4312 Post-traumatic stress disorder, chronic: Secondary | ICD-10-CM | POA: Diagnosis not present

## 2019-05-08 ENCOUNTER — Encounter: Payer: Self-pay | Admitting: *Deleted

## 2019-05-08 ENCOUNTER — Other Ambulatory Visit: Payer: Self-pay | Admitting: *Deleted

## 2019-05-08 NOTE — Patient Outreach (Signed)
HTA HRA Screening outreach attempt #2 without success. Left a message and advised will send a letter regarding services.  Hannah Schultz. Myrtie Neither, MSN, [facility] Gerontological Nurse Practitioner [facility] Care Management [phone]

## 2019-05-13 ENCOUNTER — Other Ambulatory Visit: Payer: Self-pay | Admitting: Pulmonary Disease

## 2019-05-13 ENCOUNTER — Other Ambulatory Visit (HOSPITAL_COMMUNITY): Payer: Self-pay | Admitting: Pulmonary Disease

## 2019-05-13 DIAGNOSIS — M545 Low back pain, unspecified: Secondary | ICD-10-CM

## 2019-05-30 ENCOUNTER — Ambulatory Visit (HOSPITAL_COMMUNITY): Payer: PPO | Attending: Pulmonary Disease

## 2019-05-30 ENCOUNTER — Encounter (HOSPITAL_COMMUNITY): Payer: Self-pay

## 2019-06-10 ENCOUNTER — Ambulatory Visit (HOSPITAL_COMMUNITY)
Admission: RE | Admit: 2019-06-10 | Discharge: 2019-06-10 | Disposition: A | Discharge status: Home / Self Care | Payer: PPO | Source: Ambulatory Visit | Attending: Pulmonary Disease | Admitting: Pulmonary Disease

## 2019-06-10 ENCOUNTER — Other Ambulatory Visit: Payer: Self-pay

## 2019-06-10 DIAGNOSIS — M545 Low back pain, unspecified: Secondary | ICD-10-CM

## 2019-06-15 DIAGNOSIS — E1140 Type 2 diabetes mellitus with diabetic neuropathy, unspecified: Secondary | ICD-10-CM | POA: Diagnosis not present

## 2019-06-15 DIAGNOSIS — E1165 Type 2 diabetes mellitus with hyperglycemia: Secondary | ICD-10-CM | POA: Diagnosis not present

## 2019-06-15 DIAGNOSIS — I10 Essential (primary) hypertension: Secondary | ICD-10-CM | POA: Diagnosis not present

## 2019-06-15 DIAGNOSIS — E7800 Pure hypercholesterolemia, unspecified: Secondary | ICD-10-CM | POA: Diagnosis not present

## 2019-06-15 DIAGNOSIS — D860 Sarcoidosis of lung: Secondary | ICD-10-CM | POA: Diagnosis not present

## 2019-06-16 DIAGNOSIS — M545 Low back pain: Secondary | ICD-10-CM | POA: Diagnosis not present

## 2019-06-16 DIAGNOSIS — D860 Sarcoidosis of lung: Secondary | ICD-10-CM | POA: Diagnosis not present

## 2019-06-16 DIAGNOSIS — I10 Essential (primary) hypertension: Secondary | ICD-10-CM | POA: Diagnosis not present

## 2019-06-16 DIAGNOSIS — E1129 Type 2 diabetes mellitus with other diabetic kidney complication: Secondary | ICD-10-CM | POA: Diagnosis not present

## 2019-06-26 ENCOUNTER — Other Ambulatory Visit: Payer: Self-pay | Admitting: *Deleted

## 2019-06-26 DIAGNOSIS — R6889 Other general symptoms and signs: Secondary | ICD-10-CM | POA: Diagnosis not present

## 2019-06-26 DIAGNOSIS — Z20822 Contact with and (suspected) exposure to covid-19: Secondary | ICD-10-CM

## 2019-07-01 LAB — NOVEL CORONAVIRUS, NAA: SARS-CoV-2, NAA: NOT DETECTED

## 2019-07-04 ENCOUNTER — Other Ambulatory Visit: Payer: Self-pay

## 2019-07-04 ENCOUNTER — Encounter (HOSPITAL_COMMUNITY): Payer: Self-pay | Admitting: Emergency Medicine

## 2019-07-04 ENCOUNTER — Emergency Department (HOSPITAL_COMMUNITY)
Admission: EM | Admit: 2019-07-04 | Discharge: 2019-07-04 | Disposition: A | Discharge status: Home / Self Care | Payer: PPO | Attending: Emergency Medicine | Admitting: Emergency Medicine

## 2019-07-04 DIAGNOSIS — T675XXA Heat exhaustion, unspecified, initial encounter: Secondary | ICD-10-CM | POA: Insufficient documentation

## 2019-07-04 DIAGNOSIS — Z79899 Other long term (current) drug therapy: Secondary | ICD-10-CM | POA: Insufficient documentation

## 2019-07-04 DIAGNOSIS — I10 Essential (primary) hypertension: Secondary | ICD-10-CM | POA: Insufficient documentation

## 2019-07-04 DIAGNOSIS — Z209 Contact with and (suspected) exposure to unspecified communicable disease: Secondary | ICD-10-CM | POA: Diagnosis not present

## 2019-07-04 DIAGNOSIS — E119 Type 2 diabetes mellitus without complications: Secondary | ICD-10-CM | POA: Insufficient documentation

## 2019-07-04 DIAGNOSIS — E860 Dehydration: Secondary | ICD-10-CM | POA: Diagnosis not present

## 2019-07-04 DIAGNOSIS — Z794 Long term (current) use of insulin: Secondary | ICD-10-CM | POA: Diagnosis not present

## 2019-07-04 DIAGNOSIS — W19XXXA Unspecified fall, initial encounter: Secondary | ICD-10-CM | POA: Diagnosis not present

## 2019-07-04 DIAGNOSIS — R55 Syncope and collapse: Secondary | ICD-10-CM | POA: Diagnosis not present

## 2019-07-04 LAB — CBC WITH DIFFERENTIAL/PLATELET
Abs Immature Granulocytes: 0.01 10*3/uL (ref 0.00–0.07)
Basophils Absolute: 0 10*3/uL (ref 0.0–0.1)
Basophils Relative: 1 %
Eosinophils Absolute: 0.1 10*3/uL (ref 0.0–0.5)
Eosinophils Relative: 4 %
HCT: 37.1 % (ref 36.0–46.0)
Hemoglobin: 11.7 g/dL — ABNORMAL LOW (ref 12.0–15.0)
Immature Granulocytes: 0 %
Lymphocytes Relative: 39 %
Lymphs Abs: 1.1 10*3/uL (ref 0.7–4.0)
MCH: 26.1 pg (ref 26.0–34.0)
MCHC: 31.5 g/dL (ref 30.0–36.0)
MCV: 82.6 fL (ref 80.0–100.0)
Monocytes Absolute: 0.4 10*3/uL (ref 0.1–1.0)
Monocytes Relative: 14 %
Neutro Abs: 1.2 10*3/uL — ABNORMAL LOW (ref 1.7–7.7)
Neutrophils Relative %: 42 %
Platelets: 185 10*3/uL (ref 150–400)
RBC: 4.49 MIL/uL (ref 3.87–5.11)
RDW: 14.6 % (ref 11.5–15.5)
WBC: 2.8 10*3/uL — ABNORMAL LOW (ref 4.0–10.5)
nRBC: 0 % (ref 0.0–0.2)

## 2019-07-04 LAB — COMPREHENSIVE METABOLIC PANEL
ALT: 27 U/L (ref 0–44)
AST: 27 U/L (ref 15–41)
Albumin: 3.8 g/dL (ref 3.5–5.0)
Alkaline Phosphatase: 94 U/L (ref 38–126)
Anion gap: 8 (ref 5–15)
BUN: 19 mg/dL (ref 6–20)
CO2: 25 mmol/L (ref 22–32)
Calcium: 9 mg/dL (ref 8.9–10.3)
Chloride: 104 mmol/L (ref 98–111)
Creatinine, Ser: 1.65 mg/dL — ABNORMAL HIGH (ref 0.44–1.00)
GFR calc Af Amer: 40 mL/min — ABNORMAL LOW (ref >60)
GFR calc non Af Amer: 34 mL/min — ABNORMAL LOW (ref >60)
Glucose, Bld: 184 mg/dL — ABNORMAL HIGH (ref 70–99)
Potassium: 4.1 mmol/L (ref 3.5–5.1)
Sodium: 137 mmol/L (ref 135–145)
Total Bilirubin: 0.9 mg/dL (ref 0.3–1.2)
Total Protein: 7 g/dL (ref 6.5–8.1)

## 2019-07-04 LAB — URINALYSIS, ROUTINE W REFLEX MICROSCOPIC
Bilirubin Urine: NEGATIVE
Glucose, UA: NEGATIVE mg/dL
Ketones, ur: NEGATIVE mg/dL
Leukocytes,Ua: NEGATIVE
Nitrite: NEGATIVE
Protein, ur: NEGATIVE mg/dL
Specific Gravity, Urine: 1.012 (ref 1.005–1.030)
pH: 5 (ref 5.0–8.0)

## 2019-07-04 MED ORDER — LACTATED RINGERS IV BOLUS: 1000.0000 mL | Freq: Once | INTRAVENOUS | Status: DC

## 2019-07-04 MED ORDER — SODIUM CHLORIDE 0.9 % IV SOLN
Freq: Once | INTRAVENOUS | Status: AC
  Administered 2019-07-04: 20:00:00 via INTRAVENOUS
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
## 2019-07-04 NOTE — Discharge Instructions (Signed)
Please stay home for the next 24 hours and drink plenty of clear fluids.  Seek medical exam for severe or worsening symptoms including lightheadedness chest pain shortness of breath nausea vomiting diarrhea or any worsening weakness.

## 2019-07-06 LAB — URINE CULTURE: Culture: <10000 — AB

## 2019-07-20 ENCOUNTER — Telehealth: Payer: Self-pay | Admitting: Pulmonary Disease

## 2019-07-20 NOTE — Telephone Encounter (Signed)
Informed pt of negative covid result

## 2019-07-28 DIAGNOSIS — D860 Sarcoidosis of lung: Secondary | ICD-10-CM | POA: Diagnosis not present

## 2019-07-28 DIAGNOSIS — I129 Hypertensive chronic kidney disease with stage 1 through stage 4 chronic kidney disease, or unspecified chronic kidney disease: Secondary | ICD-10-CM | POA: Diagnosis not present

## 2019-07-28 DIAGNOSIS — E1121 Type 2 diabetes mellitus with diabetic nephropathy: Secondary | ICD-10-CM | POA: Diagnosis not present

## 2019-07-28 DIAGNOSIS — M545 Low back pain: Secondary | ICD-10-CM | POA: Diagnosis not present

## 2019-07-29 ENCOUNTER — Other Ambulatory Visit: Payer: Self-pay

## 2019-07-29 ENCOUNTER — Ambulatory Visit (HOSPITAL_COMMUNITY)
Admission: RE | Admit: 2019-07-29 | Discharge: 2019-07-29 | Disposition: A | Discharge status: Home / Self Care | Payer: PPO | Source: Ambulatory Visit | Attending: Pulmonary Disease | Admitting: Pulmonary Disease

## 2019-07-29 ENCOUNTER — Other Ambulatory Visit (HOSPITAL_COMMUNITY): Payer: Self-pay | Admitting: Pulmonary Disease

## 2019-07-29 DIAGNOSIS — D860 Sarcoidosis of lung: Secondary | ICD-10-CM | POA: Diagnosis not present

## 2019-07-29 DIAGNOSIS — D869 Sarcoidosis, unspecified: Secondary | ICD-10-CM | POA: Insufficient documentation

## 2019-07-29 DIAGNOSIS — I129 Hypertensive chronic kidney disease with stage 1 through stage 4 chronic kidney disease, or unspecified chronic kidney disease: Secondary | ICD-10-CM | POA: Diagnosis not present

## 2019-07-29 DIAGNOSIS — R05 Cough: Secondary | ICD-10-CM | POA: Diagnosis not present

## 2019-07-29 DIAGNOSIS — E1121 Type 2 diabetes mellitus with diabetic nephropathy: Secondary | ICD-10-CM | POA: Diagnosis not present

## 2019-07-29 DIAGNOSIS — M545 Low back pain: Secondary | ICD-10-CM | POA: Diagnosis not present

## 2019-07-29 LAB — BASIC METABOLIC PANEL: Glucose: 212

## 2019-07-30 ENCOUNTER — Other Ambulatory Visit (HOSPITAL_COMMUNITY): Payer: Self-pay | Admitting: Pulmonary Disease

## 2019-07-30 DIAGNOSIS — Z1231 Encounter for screening mammogram for malignant neoplasm of breast: Secondary | ICD-10-CM

## 2019-07-30 LAB — BASIC METABOLIC PANEL
BUN: 21 (ref 4–21)
Creatinine: 1.8 — AB (ref <=1.1)

## 2019-07-30 LAB — COMPREHENSIVE METABOLIC PANEL
Albumin: 3.8 (ref 3.5–5.0)
Calcium: 9.6 (ref 8.7–10.7)

## 2019-08-18 DIAGNOSIS — D869 Sarcoidosis, unspecified: Secondary | ICD-10-CM | POA: Insufficient documentation

## 2019-08-18 DIAGNOSIS — I10 Essential (primary) hypertension: Secondary | ICD-10-CM | POA: Diagnosis not present

## 2019-08-18 DIAGNOSIS — E1129 Type 2 diabetes mellitus with other diabetic kidney complication: Secondary | ICD-10-CM | POA: Diagnosis not present

## 2019-08-18 DIAGNOSIS — Z832 Family history of diseases of the blood and blood-forming organs and certain disorders involving the immune mechanism: Secondary | ICD-10-CM | POA: Insufficient documentation

## 2019-08-18 DIAGNOSIS — N183 Chronic kidney disease, stage 3 (moderate): Secondary | ICD-10-CM | POA: Diagnosis not present

## 2019-08-26 ENCOUNTER — Other Ambulatory Visit: Payer: Self-pay | Admitting: Nephrology

## 2019-08-26 DIAGNOSIS — N183 Chronic kidney disease, stage 3 unspecified: Secondary | ICD-10-CM

## 2019-08-28 DIAGNOSIS — D649 Anemia, unspecified: Secondary | ICD-10-CM | POA: Diagnosis not present

## 2019-08-28 DIAGNOSIS — E1129 Type 2 diabetes mellitus with other diabetic kidney complication: Secondary | ICD-10-CM | POA: Diagnosis not present

## 2019-08-28 DIAGNOSIS — N183 Chronic kidney disease, stage 3 (moderate): Secondary | ICD-10-CM | POA: Diagnosis not present

## 2019-08-28 DIAGNOSIS — I10 Essential (primary) hypertension: Secondary | ICD-10-CM | POA: Diagnosis not present

## 2019-08-31 ENCOUNTER — Ambulatory Visit (HOSPITAL_COMMUNITY)
Admission: RE | Admit: 2019-08-31 | Discharge: 2019-08-31 | Disposition: A | Discharge status: Home / Self Care | Payer: PPO | Source: Ambulatory Visit | Attending: Nephrology | Admitting: Nephrology

## 2019-08-31 ENCOUNTER — Other Ambulatory Visit: Payer: Self-pay

## 2019-08-31 ENCOUNTER — Ambulatory Visit (HOSPITAL_COMMUNITY): Payer: PPO

## 2019-08-31 DIAGNOSIS — N183 Chronic kidney disease, stage 3 unspecified: Secondary | ICD-10-CM

## 2019-08-31 DIAGNOSIS — N189 Chronic kidney disease, unspecified: Secondary | ICD-10-CM | POA: Diagnosis not present

## 2019-09-07 DIAGNOSIS — E1121 Type 2 diabetes mellitus with diabetic nephropathy: Secondary | ICD-10-CM | POA: Diagnosis not present

## 2019-09-07 DIAGNOSIS — D860 Sarcoidosis of lung: Secondary | ICD-10-CM | POA: Diagnosis not present

## 2019-09-07 DIAGNOSIS — I129 Hypertensive chronic kidney disease with stage 1 through stage 4 chronic kidney disease, or unspecified chronic kidney disease: Secondary | ICD-10-CM | POA: Diagnosis not present

## 2019-09-07 LAB — HEMOGLOBIN A1C: Hemoglobin A1C: 8.6

## 2019-09-14 ENCOUNTER — Other Ambulatory Visit: Payer: Self-pay

## 2019-09-14 ENCOUNTER — Ambulatory Visit (HOSPITAL_COMMUNITY)
Admission: RE | Admit: 2019-09-14 | Discharge: 2019-09-14 | Disposition: A | Discharge status: Home / Self Care | Payer: PPO | Source: Ambulatory Visit | Attending: Pulmonary Disease | Admitting: Pulmonary Disease

## 2019-09-14 DIAGNOSIS — Z1231 Encounter for screening mammogram for malignant neoplasm of breast: Secondary | ICD-10-CM | POA: Diagnosis not present

## 2019-09-24 DIAGNOSIS — E1122 Type 2 diabetes mellitus with diabetic chronic kidney disease: Secondary | ICD-10-CM | POA: Insufficient documentation

## 2019-09-28 ENCOUNTER — Other Ambulatory Visit: Payer: Self-pay | Admitting: *Deleted

## 2019-09-28 NOTE — Patient Outreach (Signed)
[facility]) Care Management  09/28/2019  Hannah Schultz Feb 06, 1961 [id number]   Care coordination  [facility] RN CM received a call without a message from pt on 09/27/19   [facility] RN CM returned the call but received no answer 09/28/19  [facility] RN CM received a return call from the pt She is able to verify HIPAA   She updated CM that she is having CKD stage 3 concerns and is to see urologist on 09/30/19 and primary care MD on next week Questions answered related to CKD  [facility] RN CM discussed with her that HTA patients now have [facility] for care management  [facility] RN CM and patient attempted to reach [facility] to get connected with her new care management program but without success  [facility] RN CM provided the patient with her HTA concierge number and encouraged her to call for care management services She denies receiving letter from [facility] or [facility]   Plan Routed note to MD Dr Johnette Abraham Simonne Come [name], RN, BSN, [place] Coordinator Office number [phone] Mobile number [phone]  Main [facility] number [phone] Fax number [phone]

## 2019-09-30 DIAGNOSIS — R809 Proteinuria, unspecified: Secondary | ICD-10-CM | POA: Diagnosis not present

## 2019-09-30 DIAGNOSIS — D631 Anemia in chronic kidney disease: Secondary | ICD-10-CM | POA: Diagnosis not present

## 2019-09-30 DIAGNOSIS — I129 Hypertensive chronic kidney disease with stage 1 through stage 4 chronic kidney disease, or unspecified chronic kidney disease: Secondary | ICD-10-CM | POA: Diagnosis not present

## 2019-09-30 DIAGNOSIS — E1129 Type 2 diabetes mellitus with other diabetic kidney complication: Secondary | ICD-10-CM | POA: Diagnosis not present

## 2019-09-30 DIAGNOSIS — E1122 Type 2 diabetes mellitus with diabetic chronic kidney disease: Secondary | ICD-10-CM | POA: Diagnosis not present

## 2019-09-30 DIAGNOSIS — N189 Chronic kidney disease, unspecified: Secondary | ICD-10-CM | POA: Diagnosis not present

## 2019-10-09 DIAGNOSIS — E113293 Type 2 diabetes mellitus with mild nonproliferative diabetic retinopathy without macular edema, bilateral: Secondary | ICD-10-CM | POA: Diagnosis not present

## 2019-10-09 DIAGNOSIS — H25813 Combined forms of age-related cataract, bilateral: Secondary | ICD-10-CM | POA: Diagnosis not present

## 2019-10-13 DIAGNOSIS — M47816 Spondylosis without myelopathy or radiculopathy, lumbar region: Secondary | ICD-10-CM | POA: Insufficient documentation

## 2019-10-13 DIAGNOSIS — G8929 Other chronic pain: Secondary | ICD-10-CM | POA: Diagnosis not present

## 2019-10-13 DIAGNOSIS — M545 Low back pain: Secondary | ICD-10-CM | POA: Diagnosis not present

## 2019-10-14 DIAGNOSIS — D631 Anemia in chronic kidney disease: Secondary | ICD-10-CM | POA: Diagnosis not present

## 2019-10-14 DIAGNOSIS — R809 Proteinuria, unspecified: Secondary | ICD-10-CM | POA: Diagnosis not present

## 2019-10-14 DIAGNOSIS — N189 Chronic kidney disease, unspecified: Secondary | ICD-10-CM | POA: Diagnosis not present

## 2019-10-14 DIAGNOSIS — E1122 Type 2 diabetes mellitus with diabetic chronic kidney disease: Secondary | ICD-10-CM | POA: Diagnosis not present

## 2019-10-19 DIAGNOSIS — I129 Hypertensive chronic kidney disease with stage 1 through stage 4 chronic kidney disease, or unspecified chronic kidney disease: Secondary | ICD-10-CM | POA: Diagnosis not present

## 2019-10-19 DIAGNOSIS — E1121 Type 2 diabetes mellitus with diabetic nephropathy: Secondary | ICD-10-CM | POA: Diagnosis not present

## 2019-10-19 DIAGNOSIS — M545 Low back pain: Secondary | ICD-10-CM | POA: Diagnosis not present

## 2019-10-19 DIAGNOSIS — D860 Sarcoidosis of lung: Secondary | ICD-10-CM | POA: Diagnosis not present

## 2019-10-27 ENCOUNTER — Ambulatory Visit: Payer: Self-pay | Admitting: "Endocrinology

## 2019-10-31 LAB — ANA: ANA RESULT:: POSITIVE

## 2019-11-09 DIAGNOSIS — I10 Essential (primary) hypertension: Secondary | ICD-10-CM | POA: Diagnosis not present

## 2019-11-09 DIAGNOSIS — E1140 Type 2 diabetes mellitus with diabetic neuropathy, unspecified: Secondary | ICD-10-CM | POA: Diagnosis not present

## 2019-11-09 DIAGNOSIS — E7800 Pure hypercholesterolemia, unspecified: Secondary | ICD-10-CM | POA: Diagnosis not present

## 2019-11-09 DIAGNOSIS — E1165 Type 2 diabetes mellitus with hyperglycemia: Secondary | ICD-10-CM | POA: Diagnosis not present

## 2019-11-09 DIAGNOSIS — D860 Sarcoidosis of lung: Secondary | ICD-10-CM | POA: Diagnosis not present

## 2019-11-18 DIAGNOSIS — E559 Vitamin D deficiency, unspecified: Secondary | ICD-10-CM | POA: Diagnosis not present

## 2019-11-18 DIAGNOSIS — N1831 Chronic kidney disease, stage 3a: Secondary | ICD-10-CM | POA: Diagnosis not present

## 2019-11-18 DIAGNOSIS — R809 Proteinuria, unspecified: Secondary | ICD-10-CM | POA: Diagnosis not present

## 2019-11-18 DIAGNOSIS — D631 Anemia in chronic kidney disease: Secondary | ICD-10-CM | POA: Diagnosis not present

## 2019-11-18 DIAGNOSIS — Z79899 Other long term (current) drug therapy: Secondary | ICD-10-CM | POA: Diagnosis not present

## 2019-11-19 DIAGNOSIS — E559 Vitamin D deficiency, unspecified: Secondary | ICD-10-CM | POA: Diagnosis not present

## 2019-11-19 DIAGNOSIS — N1831 Chronic kidney disease, stage 3a: Secondary | ICD-10-CM | POA: Diagnosis not present

## 2019-11-19 DIAGNOSIS — Z79899 Other long term (current) drug therapy: Secondary | ICD-10-CM | POA: Diagnosis not present

## 2019-11-19 DIAGNOSIS — R809 Proteinuria, unspecified: Secondary | ICD-10-CM | POA: Diagnosis not present

## 2019-11-19 DIAGNOSIS — D631 Anemia in chronic kidney disease: Secondary | ICD-10-CM | POA: Diagnosis not present

## 2019-11-25 DIAGNOSIS — D631 Anemia in chronic kidney disease: Secondary | ICD-10-CM | POA: Diagnosis not present

## 2019-11-25 DIAGNOSIS — N189 Chronic kidney disease, unspecified: Secondary | ICD-10-CM | POA: Diagnosis not present

## 2019-11-25 DIAGNOSIS — E1122 Type 2 diabetes mellitus with diabetic chronic kidney disease: Secondary | ICD-10-CM | POA: Diagnosis not present

## 2019-11-25 DIAGNOSIS — R809 Proteinuria, unspecified: Secondary | ICD-10-CM | POA: Diagnosis not present

## 2019-11-25 DIAGNOSIS — I129 Hypertensive chronic kidney disease with stage 1 through stage 4 chronic kidney disease, or unspecified chronic kidney disease: Secondary | ICD-10-CM | POA: Diagnosis not present

## 2019-11-25 DIAGNOSIS — E1129 Type 2 diabetes mellitus with other diabetic kidney complication: Secondary | ICD-10-CM | POA: Diagnosis not present

## 2019-12-16 ENCOUNTER — Other Ambulatory Visit: Payer: Self-pay

## 2019-12-16 ENCOUNTER — Encounter: Payer: Self-pay | Admitting: Family Medicine

## 2019-12-16 ENCOUNTER — Ambulatory Visit (INDEPENDENT_AMBULATORY_CARE_PROVIDER_SITE_OTHER): Payer: PPO | Admitting: Family Medicine

## 2019-12-16 VITALS — BP 120/78 | HR 84 | Temp 97.6°F | Ht 64.0 in | Wt 274.4 lb

## 2019-12-16 DIAGNOSIS — E1140 Type 2 diabetes mellitus with diabetic neuropathy, unspecified: Secondary | ICD-10-CM | POA: Diagnosis not present

## 2019-12-16 DIAGNOSIS — N183 Chronic kidney disease, stage 3 unspecified: Secondary | ICD-10-CM | POA: Diagnosis not present

## 2019-12-16 DIAGNOSIS — Z794 Long term (current) use of insulin: Secondary | ICD-10-CM | POA: Diagnosis not present

## 2019-12-16 DIAGNOSIS — E785 Hyperlipidemia, unspecified: Secondary | ICD-10-CM

## 2019-12-16 DIAGNOSIS — R112 Nausea with vomiting, unspecified: Secondary | ICD-10-CM | POA: Diagnosis not present

## 2019-12-16 DIAGNOSIS — I10 Essential (primary) hypertension: Secondary | ICD-10-CM | POA: Diagnosis not present

## 2019-12-16 MED ORDER — PROMETHAZINE HCL 25 MG PO TABS: 25.0000 mg | ORAL_TABLET | Freq: Three times a day (TID) | ORAL | 0 refills | Status: DC | PRN

## 2019-12-16 MED ORDER — PROMETHAZINE HCL 25 MG/ML IJ SOLN: 25.0000 mg | Freq: Four times a day (QID) | INTRAMUSCULAR | Status: DC | PRN

## 2019-12-16 NOTE — Progress Notes (Signed)
New Patient Office Visit  Subjective:  Patient ID: Hannah Schultz, female    DOB: 12-Jan-1961  Age: 59 y.o. MRN: [id number] Total Prescriptions: 7   Total Private Pay: 1   Fill Date ID   Written Drug Qty Days Prescriber Rx # Pharmacy Refill   Daily Dose* Pymt Type PMP    10/19/2019  1   10/19/2019  Hydrocodone-Acetamin 5-325 MG  20.00  5 [place]   [id number]   [id number]   0  20.00 MME  Medicare   [place]  09/08/2019  1   09/07/2019  Hydrocodone-Acetamin 5-325 MG  20.00  5 [place]   [id number]   [facility] ([id number]   0  20.00 MME  Medicare   [place]  07/29/2019  1   07/28/2019  Hydrocodone-Acetamin 5-325 MG  20.00  5 [facility] [place]   [id number]   [facility] ([id number])   0  20.00 MME  Medicare   [place]  01/21/2019  1   01/21/2019  Hydrocodone-Acetamin 5-325 MG  12.00  3 [place]   [id number]   [facility] ([id number])   0  20.00 MME  Medicare   [place]  10/07/2018  1   10/07/2018  Acetaminophen-Cod #3 Tablet  20.00  4 [place]   [id number]   [facility] ([id number])   0  22.50 MME  Medicare   [place]  02/07/2018  1   02/07/2018  Oxycodone-Acetaminophen 5-325  45.00  4 [address] [facility]   [id number]   [facility] ([id number])   0  84.38 MME  Medicare   [place]  01/29/2018  1   01/29/2018  Hydrocodone-Acetamin 5-300 MG  20.00  3 [facility] Adk   [id number]   [facility] ([id number])   0  33.33 MME  Private Pay      CC:  Chief Complaint  Patient presents with  . Establish Care  . Nausea  . Emesis    Monday Night  . Diarrhea  . Abdominal Pain    Lower right s/p cyst removal on outside of vagina  . Back Pain  nephro Impression: 1)Renal  Pateint Has CKD stage 3 B . Pateint has CKD since 2018 Pateint CKD secondary to DIABETES MELLITUS  Pateint CKD has Possible contribution from hypertension It is unlikely from sarcoid as not much prgression and no hypercalcemia Patient's CKD Progression has been slow Pateint has no history of Nephrolithiasis . 2) Proteinuria  Present Pateint is On RAS blockers Proteinuria is at goal ( less than $RemoveB'500mg'xJhzpspp$ ) 3)Hypertension  Blood pressure is at goal Medication- on RAS blockers On Beta blockers 4)Anemia  of chronic disease Hgb now at goal NO need for epo 5) Secondary hyperparathyroidism -CKD Mineral-Bone Disorder Secondary Hyperparathyroidism absent Phosphorus at goal. 6)Vitamin 25 Hydroxy deficiency 7)Electrolytes Normokalemic Pulmonary 18 from last night low-grade fever 141- NOrmonatremic 8) DIABETES MELLITUS  Being managed by pcp  9) Morbid obesity-Pt with BMI of more than 36  HPI LELIANA Schultz presents for n/v/d on Monday with only diarrhea on Wed. Pt tolerated potatoes, pork chop along with sprite tea and water. PTSD-takes Prozac-stable currently Work injury in 06-lumbar MRI shows DDD 7/20-injections-pain management -oral narcotics in the past Glucose readings -fasting 136, after dinner 304 high-pt with am 70 units and pm 60units-pt uses sliding scale- Sarcoid diagnosis-pulse and respirations normal Labs reviewed-renal function Cr 1.79, glucose 292, GFR 39 Past Medical History:  Diagnosis Date  . Acid reflux   . Arthritis   . Bulging lumbar disc   . Cataract   . Depression   .  Diabetes mellitus   . Diabetic neuropathy ([place])   . High cholesterol   . Hypertension   . Post traumatic stress disorder (PTSD)   . Sleep apnea     Past Surgical History:  Procedure Laterality Date  . ABDOMINAL HYSTERECTOMY    . CHOLECYSTECTOMY    . COLONOSCOPY    . INCISION AND DRAINAGE ABSCESS N/A 02/06/2018   Procedure: INCISION AND DRAINAGE vulvar ABSCESS;  Surgeon: Marylynn Pearson, MD;  Location: [facility];  Service: Gynecology;  Laterality: N/A;  . MEDIASTINOSCOPY N/A 04/25/2017   Procedure: MEDIASTINOSCOPY;  Surgeon: Melrose Nakayama, MD;  Location: [place] [facility];  Service: Thoracic;  Laterality: N/A;  . TONSILLECTOMY  04/02/2012   Procedure: TONSILLECTOMY;  Surgeon: Ascencion Dike, MD;  Location: [facility] OR;  Service: ENT;  Laterality: Bilateral;    Family History  Problem Relation Age of Onset  . Heart disease Other   . Cancer Other   . Diabetes Other     Social History    Socioeconomic History  . Marital status: Married    Spouse name: Not on file  . Number of children: Not on file  . Years of education: Not on file  . Highest education level: Not on file  Occupational History  . Not on file  Tobacco Use  . Smoking status: Never Smoker  . Smokeless tobacco: Never Used  Substance and Sexual Activity  . Alcohol use: No  . Drug use: No  . Sexual activity: Yes    Birth control/protection: Surgical  Other Topics Concern  . Not on file  Social History Narrative  . Not on file   Social Determinants of Health   Financial Resource Strain:   . Difficulty of Paying Living Expenses: Not on file  Food Insecurity:   . Worried About Charity fundraiser in the Last Year: Not on file  . Ran Out of Food in the Last Year: Not on file  Transportation Needs:   . Lack of Transportation (Medical): Not on file  . Lack of Transportation (Non-Medical): Not on file  Physical Activity:   . Days of Exercise per Week: Not on file  . Minutes of Exercise per Session: Not on file  Stress:   . Feeling of Stress : Not on file  Social Connections:   . Frequency of Communication with Friends and Family: Not on file  . Frequency of Social Gatherings with Friends and Family: Not on file  . Attends Religious Services: Not on file  . Active Member of Clubs or Organizations: Not on file  . Attends Archivist Meetings: Not on file  . Marital Status: Not on file  Intimate Partner Violence:   . Fear of Current or Ex-Partner: Not on file  . Emotionally Abused: Not on file  . Physically Abused: Not on file  . Sexually Abused: Not on file    ROS Review of Systems  Constitutional: Negative.   Eyes: Negative.   Respiratory:       Sarcoid  Cardiovascular: Negative.   Gastrointestinal:       N/v/d-tolerating meds today  Endocrine:       DM  Genitourinary:       Lisinopril-next appt 2/21  Musculoskeletal: Positive for back pain.       Job injury-injections   After MRI lumbar spine  Allergic/Immunologic: Negative.   Neurological: Negative.   Hematological: Negative.   Psychiatric/Behavioral:       PTSD    Objective:   Today's Vitals:  BP 120/78 (BP Location: Left Arm, Patient Position: Sitting, Cuff Size: Normal)   Pulse 94   Temp 97.6 F (36.4 C) (Oral)   Ht $R'5\' 4"'If$  (1.626 m)   Wt 274 lb 6.4 oz (124.5 kg)   SpO2 (!) 84%   BMI 47.10 kg/m   Physical Exam Vitals reviewed.  Constitutional:      Appearance: She is well-developed.  HENT:     Head: Normocephalic and atraumatic.     Mouth/Throat:     Mouth: Mucous membranes are moist.     Pharynx: Oropharynx is clear.  Cardiovascular:     Rate and Rhythm: Normal rate and regular rhythm.     Heart sounds: Normal heart sounds.  Pulmonary:     Effort: Pulmonary effort is normal.     Breath sounds: Normal breath sounds.  Abdominal:     General: Abdomen is flat. Bowel sounds are normal.     Palpations: Abdomen is rigid.  Neurological:     General: No focal deficit present.     Mental Status: She is alert and oriented to person, place, and time.  Psychiatric:        Mood and Affect: Mood normal.        Behavior: Behavior normal.     Assessment & Plan:  1. Hyperlipidemia, unspecified hyperlipidemia type - Lipid panel - COMPLETE METABOLIC PANEL WITH GFR Simvastatin-stable-recheck 2. Nausea and vomiting, intractability of vomiting not specified, unspecified vomiting type  promethazine (PHENERGAN) injection 25 mg-oral if needed q 6 hours prn  3. Stage 3 chronic kidney disease, unspecified whether stage 3a or 3b CKD Continued follow up  4. HTN-amlodipiine-stable Outpatient Encounter Medications as of 12/16/2019  Medication Sig  . Cholecalciferol (VITAMIN D3) 5000 UNITS TABS Take 5,000 Units by mouth daily.   . famotidine (PEPCID) 20 MG tablet Take 20 mg by mouth 2 (two) times daily.  Marland Kitchen FLUoxetine (PROZAC) 20 MG capsule Take 20 mg by mouth daily.  Marland Kitchen glucose blood (ONE TOUCH ULTRA  TEST) test strip 3 times per day  . insulin regular (NOVOLIN R,HUMULIN R) 100 units/mL injection Inject 10-20 Units into the skin 3 (three) times daily before meals. Based on sliding scale  . lisinopril (ZESTRIL) 20 MG tablet Take 10 mg by mouth daily.  . metoprolol tartrate (LOPRESSOR) 50 MG tablet Take 50 mg by mouth 1 day or 1 dose.  Marland Kitchen NOVOLIN N RELION 100 UNIT/ML injection Inject 50-70 Units into the skin 3 (three) times daily before meals. 70 units breakfast and lunch, 50 units at bedtime  . PROAIR HFA 108 (90 Base) MCG/ACT inhaler Inhale 1-2 puffs into the lungs every 6 (six) hours as needed for wheezing or shortness of breath.   . simvastatin (ZOCOR) 40 MG tablet Take 1 tablet (40 mg total) by mouth every evening.  . sulfamethoxazole-trimethoprim (BACTRIM DS,SEPTRA DS) 800-160 MG tablet Take 1 tablet by mouth 2 (two) times daily.  . [DISCONTINUED] amLODipine (NORVASC) 5 MG tablet Take 5 mg by mouth daily.   . [DISCONTINUED] metoprolol succinate (TOPROL-XL) 50 MG 24 hr tablet Take 50 mg by mouth daily.  . [DISCONTINUED] oxyCODONE-acetaminophen (PERCOCET/ROXICET) 5-325 MG tablet Take 1-2 tablets by mouth every 4 (four) hours as needed for severe pain (once PCA is discontinued and PO intake resumed). (Patient not taking: Reported on 12/16/2019)  . [DISCONTINUED] predniSONE (DELTASONE) 10 MG tablet Take 10 mg by mouth daily with breakfast.   . [DISCONTINUED] ranitidine (ZANTAC) 75 MG tablet Take 75 mg by mouth at bedtime.  . [  DISCONTINUED] sitaGLIPtin (JANUVIA) 100 MG tablet Take 100 mg by mouth every morning.   . [DISCONTINUED] sulfamethoxazole-trimethoprim (BACTRIM DS,SEPTRA DS) 800-160 MG tablet Take 1 tablet by mouth 2 (two) times daily. (Patient not taking: Reported on 12/16/2019)  . [DISCONTINUED] triamcinolone cream (KENALOG) 0.5 % Apply 1 application topically daily.    No facility-administered encounter medications on file as of 12/16/2019.    Follow-up: endo, nephro  Shabree Tebbetts Hannah Beat,  MD

## 2019-12-16 NOTE — Patient Instructions (Addendum)
Phenergan-[facility] Lipid panel-fasting-labwork

## 2019-12-21 DIAGNOSIS — N183 Chronic kidney disease, stage 3 unspecified: Secondary | ICD-10-CM | POA: Insufficient documentation

## 2019-12-21 DIAGNOSIS — E785 Hyperlipidemia, unspecified: Secondary | ICD-10-CM | POA: Diagnosis not present

## 2019-12-21 DIAGNOSIS — R112 Nausea with vomiting, unspecified: Secondary | ICD-10-CM | POA: Insufficient documentation

## 2019-12-22 LAB — LIPID PANEL
Cholesterol: 164 mg/dL (ref <200)
HDL: 45 mg/dL — ABNORMAL LOW (ref >=50)
LDL Cholesterol (Calc): 91 mg/dL (calc)
Non-HDL Cholesterol (Calc): 119 mg/dL (calc) (ref <130)
Total CHOL/HDL Ratio: 3.6 (calc) (ref <5.0)
Triglycerides: 190 mg/dL — ABNORMAL HIGH (ref <150)

## 2019-12-22 LAB — COMPLETE METABOLIC PANEL WITH GFR
AG Ratio: 1.3 (calc) (ref 1.0–2.5)
ALT: 13 U/L (ref 6–29)
AST: 20 U/L (ref 10–35)
Albumin: 3.9 g/dL (ref 3.6–5.1)
Alkaline phosphatase (APISO): 83 U/L (ref 37–153)
BUN/Creatinine Ratio: 8 (calc) (ref 6–22)
BUN: 14 mg/dL (ref 7–25)
CO2: 26 mmol/L (ref 20–32)
Calcium: 9.4 mg/dL (ref 8.6–10.4)
Chloride: 101 mmol/L (ref 98–110)
Creat: 1.82 mg/dL — ABNORMAL HIGH (ref 0.50–1.05)
GFR, Est African American: 35 mL/min/{1.73_m2} — ABNORMAL LOW (ref >=60)
GFR, Est Non African American: 31 mL/min/{1.73_m2} — ABNORMAL LOW (ref >=60)
Globulin: 3.1 g/dL (calc) (ref 1.9–3.7)
Glucose, Bld: 154 mg/dL — ABNORMAL HIGH (ref 65–99)
Potassium: 4 mmol/L (ref 3.5–5.3)
Sodium: 137 mmol/L (ref 135–146)
Total Bilirubin: 0.5 mg/dL (ref 0.2–1.2)
Total Protein: 7 g/dL (ref 6.1–8.1)

## 2019-12-23 DIAGNOSIS — D860 Sarcoidosis of lung: Secondary | ICD-10-CM | POA: Diagnosis not present

## 2019-12-23 DIAGNOSIS — N189 Chronic kidney disease, unspecified: Secondary | ICD-10-CM | POA: Diagnosis not present

## 2019-12-23 DIAGNOSIS — E1140 Type 2 diabetes mellitus with diabetic neuropathy, unspecified: Secondary | ICD-10-CM | POA: Diagnosis not present

## 2019-12-23 DIAGNOSIS — E1165 Type 2 diabetes mellitus with hyperglycemia: Secondary | ICD-10-CM | POA: Diagnosis not present

## 2019-12-23 DIAGNOSIS — I10 Essential (primary) hypertension: Secondary | ICD-10-CM | POA: Diagnosis not present

## 2019-12-23 DIAGNOSIS — E7800 Pure hypercholesterolemia, unspecified: Secondary | ICD-10-CM | POA: Diagnosis not present

## 2019-12-24 DIAGNOSIS — E1165 Type 2 diabetes mellitus with hyperglycemia: Secondary | ICD-10-CM | POA: Diagnosis not present

## 2019-12-31 DIAGNOSIS — Z1272 Encounter for screening for malignant neoplasm of vagina: Secondary | ICD-10-CM | POA: Diagnosis not present

## 2019-12-31 DIAGNOSIS — Z124 Encounter for screening for malignant neoplasm of cervix: Secondary | ICD-10-CM | POA: Diagnosis not present

## 2019-12-31 DIAGNOSIS — Z6841 Body Mass Index (BMI) 40.0 and over, adult: Secondary | ICD-10-CM | POA: Diagnosis not present

## 2020-01-04 ENCOUNTER — Telehealth: Payer: Self-pay

## 2020-01-04 DIAGNOSIS — I10 Essential (primary) hypertension: Secondary | ICD-10-CM

## 2020-01-04 MED ORDER — METOPROLOL TARTRATE 50 MG PO TABS: 50.0000 mg | ORAL_TABLET | ORAL | 0 refills | Status: DC

## 2020-01-04 NOTE — Telephone Encounter (Signed)
LeighAnn Finnian Husted, CMA  

## 2020-01-12 ENCOUNTER — Telehealth: Payer: Self-pay | Admitting: Family Medicine

## 2020-01-12 NOTE — Telephone Encounter (Signed)
Please confirm metoprolol-in Dr. Luan Pulling chart pt on metoprolol succinate ER $RemoveBefo'50mg'xWcrboDQNhi$ -see what was most recently rx for pt and change prescription to reflect medication pt was previously taking

## 2020-01-12 NOTE — Telephone Encounter (Signed)
Hannah Schultz from [place] states she has been on Metoprolol ER and wants to know if the change is correct or if she needs to be on what she was usually on.

## 2020-01-12 NOTE — Telephone Encounter (Signed)
Patient is calling and states that the pharmacy will not refill metoprolol tartrate (LOPRESSOR) 50 MG tablet That was called in on 01/04/20 because they need clarification on which one need to be filled. Patient would like you to call the pharmacy as she has been without BP medication for 4 days.

## 2020-01-13 ENCOUNTER — Telehealth: Payer: Self-pay

## 2020-01-13 DIAGNOSIS — I10 Essential (primary) hypertension: Secondary | ICD-10-CM

## 2020-01-13 MED ORDER — METOPROLOL SUCCINATE ER 50 MG PO TB24: 50.0000 mg | ORAL_TABLET | Freq: Every day | ORAL | 1 refills | Status: DC

## 2020-01-13 NOTE — Telephone Encounter (Signed)
Spoke to the pharmacy.

## 2020-01-13 NOTE — Telephone Encounter (Signed)
LeighAnn Bellanie Matthew, CMA  

## 2020-01-15 DIAGNOSIS — M545 Low back pain: Secondary | ICD-10-CM | POA: Diagnosis not present

## 2020-01-19 ENCOUNTER — Telehealth: Payer: Self-pay | Admitting: Family Medicine

## 2020-01-19 DIAGNOSIS — E1122 Type 2 diabetes mellitus with diabetic chronic kidney disease: Secondary | ICD-10-CM | POA: Diagnosis not present

## 2020-01-19 DIAGNOSIS — E1129 Type 2 diabetes mellitus with other diabetic kidney complication: Secondary | ICD-10-CM | POA: Diagnosis not present

## 2020-01-19 DIAGNOSIS — N189 Chronic kidney disease, unspecified: Secondary | ICD-10-CM | POA: Diagnosis not present

## 2020-01-19 DIAGNOSIS — I129 Hypertensive chronic kidney disease with stage 1 through stage 4 chronic kidney disease, or unspecified chronic kidney disease: Secondary | ICD-10-CM | POA: Diagnosis not present

## 2020-01-19 DIAGNOSIS — D631 Anemia in chronic kidney disease: Secondary | ICD-10-CM | POA: Diagnosis not present

## 2020-01-19 DIAGNOSIS — R809 Proteinuria, unspecified: Secondary | ICD-10-CM | POA: Diagnosis not present

## 2020-01-19 NOTE — Telephone Encounter (Signed)
Patient will not be scheduling with Dr. Dorris Fetch as she is already established with a Endocrinologist in [place], Dr. Chalmers Cater

## 2020-01-22 DIAGNOSIS — D631 Anemia in chronic kidney disease: Secondary | ICD-10-CM | POA: Diagnosis not present

## 2020-01-22 DIAGNOSIS — R809 Proteinuria, unspecified: Secondary | ICD-10-CM | POA: Diagnosis not present

## 2020-01-22 DIAGNOSIS — E1122 Type 2 diabetes mellitus with diabetic chronic kidney disease: Secondary | ICD-10-CM | POA: Diagnosis not present

## 2020-01-22 DIAGNOSIS — N189 Chronic kidney disease, unspecified: Secondary | ICD-10-CM | POA: Diagnosis not present

## 2020-01-22 DIAGNOSIS — Z79899 Other long term (current) drug therapy: Secondary | ICD-10-CM | POA: Diagnosis not present

## 2020-01-22 DIAGNOSIS — I129 Hypertensive chronic kidney disease with stage 1 through stage 4 chronic kidney disease, or unspecified chronic kidney disease: Secondary | ICD-10-CM | POA: Diagnosis not present

## 2020-01-22 DIAGNOSIS — E1129 Type 2 diabetes mellitus with other diabetic kidney complication: Secondary | ICD-10-CM | POA: Diagnosis not present

## 2020-01-30 ENCOUNTER — Other Ambulatory Visit: Payer: Self-pay | Admitting: Family Medicine

## 2020-02-01 ENCOUNTER — Other Ambulatory Visit: Payer: Self-pay

## 2020-02-01 ENCOUNTER — Emergency Department (HOSPITAL_COMMUNITY): Payer: PPO

## 2020-02-01 ENCOUNTER — Emergency Department (HOSPITAL_COMMUNITY)
Admission: EM | Admit: 2020-02-01 | Discharge: 2020-02-01 | Disposition: A | Discharge status: Home / Self Care | Payer: PPO | Attending: Emergency Medicine | Admitting: Emergency Medicine

## 2020-02-01 ENCOUNTER — Encounter (HOSPITAL_COMMUNITY): Payer: Self-pay | Admitting: Emergency Medicine

## 2020-02-01 ENCOUNTER — Telehealth: Payer: Self-pay | Admitting: Family Medicine

## 2020-02-01 DIAGNOSIS — N183 Chronic kidney disease, stage 3 unspecified: Secondary | ICD-10-CM | POA: Insufficient documentation

## 2020-02-01 DIAGNOSIS — E1122 Type 2 diabetes mellitus with diabetic chronic kidney disease: Secondary | ICD-10-CM | POA: Insufficient documentation

## 2020-02-01 DIAGNOSIS — R112 Nausea with vomiting, unspecified: Secondary | ICD-10-CM | POA: Diagnosis not present

## 2020-02-01 DIAGNOSIS — N201 Calculus of ureter: Secondary | ICD-10-CM

## 2020-02-01 DIAGNOSIS — Z79899 Other long term (current) drug therapy: Secondary | ICD-10-CM | POA: Diagnosis not present

## 2020-02-01 DIAGNOSIS — I129 Hypertensive chronic kidney disease with stage 1 through stage 4 chronic kidney disease, or unspecified chronic kidney disease: Secondary | ICD-10-CM | POA: Diagnosis not present

## 2020-02-01 DIAGNOSIS — K5730 Diverticulosis of large intestine without perforation or abscess without bleeding: Secondary | ICD-10-CM | POA: Diagnosis not present

## 2020-02-01 DIAGNOSIS — I10 Essential (primary) hypertension: Secondary | ICD-10-CM | POA: Diagnosis not present

## 2020-02-01 DIAGNOSIS — R1030 Lower abdominal pain, unspecified: Secondary | ICD-10-CM

## 2020-02-01 DIAGNOSIS — Z794 Long term (current) use of insulin: Secondary | ICD-10-CM | POA: Diagnosis not present

## 2020-02-01 LAB — COMPREHENSIVE METABOLIC PANEL
ALT: 20 U/L (ref 0–44)
AST: 27 U/L (ref 15–41)
Albumin: 4.3 g/dL (ref 3.5–5.0)
Alkaline Phosphatase: 65 U/L (ref 38–126)
Anion gap: 11 (ref 5–15)
BUN: 24 mg/dL — ABNORMAL HIGH (ref 6–20)
CO2: 26 mmol/L (ref 22–32)
Calcium: 9.8 mg/dL (ref 8.9–10.3)
Chloride: 101 mmol/L (ref 98–111)
Creatinine, Ser: 2.17 mg/dL — ABNORMAL HIGH (ref 0.44–1.00)
GFR calc Af Amer: 29 mL/min — ABNORMAL LOW (ref >60)
GFR calc non Af Amer: 25 mL/min — ABNORMAL LOW (ref >60)
Glucose, Bld: 148 mg/dL — ABNORMAL HIGH (ref 70–99)
Potassium: 4.4 mmol/L (ref 3.5–5.1)
Sodium: 138 mmol/L (ref 135–145)
Total Bilirubin: 0.7 mg/dL (ref 0.3–1.2)
Total Protein: 8.1 g/dL (ref 6.5–8.1)

## 2020-02-01 LAB — CBC WITH DIFFERENTIAL/PLATELET
Abs Immature Granulocytes: 0.01 10*3/uL (ref 0.00–0.07)
Basophils Absolute: 0 10*3/uL (ref 0.0–0.1)
Basophils Relative: 1 %
Eosinophils Absolute: 0.1 10*3/uL (ref 0.0–0.5)
Eosinophils Relative: 2 %
HCT: 39.7 % (ref 36.0–46.0)
Hemoglobin: 12.5 g/dL (ref 12.0–15.0)
Immature Granulocytes: 0 %
Lymphocytes Relative: 21 %
Lymphs Abs: 0.8 10*3/uL (ref 0.7–4.0)
MCH: 26.6 pg (ref 26.0–34.0)
MCHC: 31.5 g/dL (ref 30.0–36.0)
MCV: 84.5 fL (ref 80.0–100.0)
Monocytes Absolute: 0.4 10*3/uL (ref 0.1–1.0)
Monocytes Relative: 10 %
Neutro Abs: 2.8 10*3/uL (ref 1.7–7.7)
Neutrophils Relative %: 66 %
Platelets: 217 10*3/uL (ref 150–400)
RBC: 4.7 MIL/uL (ref 3.87–5.11)
RDW: 14.2 % (ref 11.5–15.5)
WBC: 4.1 10*3/uL (ref 4.0–10.5)
nRBC: 0 % (ref 0.0–0.2)

## 2020-02-01 LAB — URINALYSIS, ROUTINE W REFLEX MICROSCOPIC
Bacteria, UA: NONE SEEN
Bilirubin Urine: NEGATIVE
Glucose, UA: NEGATIVE mg/dL
Ketones, ur: 5 mg/dL — AB
Leukocytes,Ua: NEGATIVE
Nitrite: NEGATIVE
Protein, ur: 100 mg/dL — AB
RBC / HPF: >50 RBC/hpf — ABNORMAL HIGH (ref 0–5)
Specific Gravity, Urine: 1.018 (ref 1.005–1.030)
pH: 5 (ref 5.0–8.0)

## 2020-02-01 LAB — CBG MONITORING, ED
Glucose-Capillary: 139 mg/dL — ABNORMAL HIGH (ref 70–99)
Glucose-Capillary: 140 mg/dL — ABNORMAL HIGH (ref 70–99)

## 2020-02-01 LAB — LIPASE, BLOOD: Lipase: 38 U/L (ref 11–51)

## 2020-02-01 MED ORDER — ONDANSETRON HCL 4 MG PO TABS: 4.0000 mg | ORAL_TABLET | Freq: Four times a day (QID) | ORAL | 0 refills | Status: DC

## 2020-02-01 MED ORDER — ONDANSETRON HCL 4 MG/2ML IJ SOLN
4.0000 mg | Freq: Once | INTRAMUSCULAR | Status: AC
  Administered 2020-02-01: 4 mg via INTRAVENOUS
  Filled 2020-02-01: qty 2

## 2020-02-01 MED ORDER — SODIUM CHLORIDE 0.9 % IV BOLUS
1000.0000 mL | Freq: Once | INTRAVENOUS | Status: AC
  Administered 2020-02-01: 1000 mL via INTRAVENOUS

## 2020-02-01 MED ORDER — MORPHINE SULFATE (PF) 4 MG/ML IV SOLN
4.0000 mg | Freq: Once | INTRAVENOUS | Status: AC
  Administered 2020-02-01: 4 mg via INTRAVENOUS
  Filled 2020-02-01: qty 1

## 2020-02-01 MED ORDER — TAMSULOSIN HCL 0.4 MG PO CAPS: 0.4000 mg | ORAL_CAPSULE | Freq: Every day | ORAL | 0 refills | Status: DC

## 2020-02-01 MED ORDER — OXYCODONE-ACETAMINOPHEN 5-325 MG PO TABS: 2.0000 | ORAL_TABLET | ORAL | 0 refills | Status: DC | PRN

## 2020-02-01 MED ORDER — OXYCODONE-ACETAMINOPHEN 5-325 MG PO TABS: 1.0000 | ORAL_TABLET | ORAL | 0 refills | Status: DC | PRN

## 2020-02-01 MED ORDER — SODIUM CHLORIDE 0.9 % IV SOLN
2.0000 g | Freq: Once | INTRAVENOUS | Status: AC
  Administered 2020-02-01: 2 g via INTRAVENOUS
  Filled 2020-02-01: qty 20

## 2020-02-01 NOTE — ED Provider Notes (Signed)
[facility] EMERGENCY DEPARTMENT Provider Note   CSN: [id number] Arrival date & time: 02/01/20  1604     History Chief Complaint  Patient presents with  . Abdominal Pain    Hannah Schultz is a 59 y.o. female.  HPI      Hannah Schultz is a 59 y.o. female with a history of diabetes, stage III CKD, hypertension, who presents to the Emergency Department complaining of lower abdominal pain nausea and vomiting.  She reports sudden onset of the symptoms 2 days ago.  States she has been unable to tolerate liquids or solid foods since onset.  She describes dull pain across her lower abdomen that radiates into her back with right side worse than left.  Pain is constant and not associated with movement.  She denies pain with urination or dysuria.  She denies fever or chills, chest pain, shortness of breath, and diarrhea although she does endorse some loose stools.  No known Covid exposures.   Past Medical History:  Diagnosis Date  . Acid reflux   . Arthritis   . Bulging lumbar disc   . Cataract   . Depression   . Diabetes mellitus   . Diabetic neuropathy ([place])   . High cholesterol   . Hypertension   . Post traumatic stress disorder (PTSD)   . Sleep apnea     Patient Active Problem List   Diagnosis Date Noted  . Nausea and vomiting 12/21/2019  . Stage 3 chronic kidney disease 12/21/2019  . Family history of sarcoidosis 08/18/2019  . Vulvar abscess 02/07/2018  . Hyperlipidemia 09/19/2013  . Essential hypertension 09/19/2013  . Type II or unspecified type diabetes mellitus without mention of complication, uncontrolled 07/20/2013  . Trigger thumb of right hand 11/27/2012  . CTS (carpal tunnel syndrome) 11/27/2012    Past Surgical History:  Procedure Laterality Date  . ABDOMINAL HYSTERECTOMY    . CHOLECYSTECTOMY    . COLONOSCOPY    . INCISION AND DRAINAGE ABSCESS N/A 02/06/2018   Procedure: INCISION AND DRAINAGE vulvar ABSCESS;  Surgeon: Marylynn Pearson, MD;  Location:  [facility];  Service: Gynecology;  Laterality: N/A;  . MEDIASTINOSCOPY N/A 04/25/2017   Procedure: MEDIASTINOSCOPY;  Surgeon: Melrose Nakayama, MD;  Location: [facility] OR;  Service: Thoracic;  Laterality: N/A;  . TONSILLECTOMY  04/02/2012   Procedure: TONSILLECTOMY;  Surgeon: Ascencion Dike, MD;  Location: [facility] OR;  Service: ENT;  Laterality: Bilateral;     OB History    Gravida      Para      Term      Preterm      AB      Living  2     SAB      TAB      Ectopic      Multiple      Live Births              Family History  Problem Relation Age of Onset  . Heart disease Other   . Cancer Other   . Diabetes Other     Social History   Tobacco Use  . Smoking status: Never Smoker  . Smokeless tobacco: Never Used  Substance Use Topics  . Alcohol use: No  . Drug use: No    Home Medications Prior to Admission medications   Medication Sig Start Date End Date Taking? Authorizing Provider  Cholecalciferol (VITAMIN D3) 5000 UNITS TABS Take 5,000 Units by mouth daily.     [provider]  famotidine (PEPCID) 20 MG tablet Take 20 mg by mouth 2 (two) times daily.    [provider]  FLUoxetine (PROZAC) 20 MG capsule Take 20 mg by mouth daily. 06/05/19   [provider]  glucose blood (ONE TOUCH ULTRA TEST) test strip 3 times per day 12/29/13   Elayne Snare, MD  insulin regular (NOVOLIN R,HUMULIN R) 100 units/mL injection Inject 10-20 Units into the skin 3 (three) times daily before meals. Based on sliding scale    [provider]  lisinopril (ZESTRIL) 20 MG tablet Take 10 mg by mouth daily. 09/30/19 09/29/20  [provider]  metoprolol succinate (TOPROL-XL) 50 MG 24 hr tablet Take 1 tablet (50 mg total) by mouth daily. Take with or immediately following a meal. 01/13/20   Corum, Rex Kras, MD  NOVOLIN N RELION 100 UNIT/ML injection Inject 50-70 Units into the skin 3 (three) times daily before meals. 70 units breakfast and lunch, 50 units at  bedtime 12/13/16   [provider]  PROAIR HFA 108 (90 Base) MCG/ACT inhaler Inhale 1-2 puffs into the lungs every 6 (six) hours as needed for wheezing or shortness of breath.  10/30/17   [provider]  promethazine (PHENERGAN) 25 MG tablet Take 1 tablet (25 mg total) by mouth every 8 (eight) hours as needed for nausea or vomiting. 12/16/19   Corum, Rex Kras, MD  simvastatin (ZOCOR) 40 MG tablet Take 1 tablet (40 mg total) by mouth every evening. 09/22/13   Elayne Snare, MD  sulfamethoxazole-trimethoprim (BACTRIM DS,SEPTRA DS) 800-160 MG tablet Take 1 tablet by mouth 2 (two) times daily. 01/29/18   [provider]    Allergies    Adhesive [tape]  Review of Systems   Review of Systems  Constitutional: Negative for appetite change, chills and fever.  Respiratory: Negative for shortness of breath.   Cardiovascular: Negative for chest pain.  Gastrointestinal: Positive for abdominal pain, nausea and vomiting. Negative for blood in stool and diarrhea.  Genitourinary: Negative for decreased urine volume, difficulty urinating, dysuria, flank pain, hematuria and vaginal bleeding.  Musculoskeletal: Positive for back pain.  Skin: Negative for color change and rash.  Neurological: Negative for dizziness, syncope, weakness, numbness and headaches.  Hematological: Negative for adenopathy.    Physical Exam Updated Vital Signs BP 112/78 (BP Location: Right Arm)   Pulse 92   Temp 97.6 F (36.4 C) (Oral)   Resp 18   Ht $R'5\' 5"'Ln$  (1.651 m)   Wt 124.7 kg   SpO2 96%   BMI 45.76 kg/m   Physical Exam Vitals and nursing note reviewed.  Constitutional:      Appearance: She is not toxic-appearing.     Comments: Patient is uncomfortable appearing  HENT:     Mouth/Throat:     Mouth: Mucous membranes are dry.     Comments: Mucous membranes are mildly dry Cardiovascular:     Rate and Rhythm: Normal rate and regular rhythm.     Pulses: Normal pulses.  Pulmonary:     Effort:  Pulmonary effort is normal.     Breath sounds: Normal breath sounds.  Chest:     Chest wall: No tenderness.  Abdominal:     Tenderness: There is abdominal tenderness (Diffuse tenderness of the lower abdomen without guarding or rebound tenderness.  Tenderness of the right lower quadrant greater than left.  No peritoneal signs.). There is no right CVA tenderness or left CVA tenderness.  Musculoskeletal:        General:  Normal range of motion.  Skin:    General: Skin is warm.     Capillary Refill: Capillary refill takes less than 2 seconds.     Findings: No erythema or rash.  Neurological:     General: No focal deficit present.     Mental Status: She is alert.     Sensory: No sensory deficit.     Motor: No weakness.     ED Results / Procedures / Treatments   Labs (all labs ordered are listed, but only abnormal results are displayed) Labs Reviewed  COMPREHENSIVE METABOLIC PANEL - Abnormal; Notable for the following components:      Result Value   Glucose, Bld 148 (*)    BUN 24 (*)    Creatinine, Ser 2.17 (*)    GFR calc non Af Amer 25 (*)    GFR calc Af Amer 29 (*)    All other components within normal limits  URINALYSIS, ROUTINE W REFLEX MICROSCOPIC - Abnormal; Notable for the following components:   APPearance HAZY (*)    Hgb urine dipstick LARGE (*)    Ketones, ur 5 (*)    Protein, ur 100 (*)    RBC / HPF >50 (*)    All other components within normal limits  CBG MONITORING, ED - Abnormal; Notable for the following components:   Glucose-Capillary 139 (*)    All other components within normal limits  CBG MONITORING, ED - Abnormal; Notable for the following components:   Glucose-Capillary 140 (*)    All other components within normal limits  LIPASE, BLOOD  CBC WITH DIFFERENTIAL/PLATELET    EKG None  Radiology CT ABDOMEN PELVIS WO CONTRAST  Result Date: 02/01/2020 CLINICAL DATA:  Nausea and vomiting, lower abdominal pain for 2 days EXAM: CT ABDOMEN AND PELVIS WITHOUT  CONTRAST TECHNIQUE: Multidetector CT imaging of the abdomen and pelvis was performed following the standard protocol without IV contrast. COMPARISON:  04/11/2017 FINDINGS: Lower chest: No acute pleural or parenchymal lung disease. Hepatobiliary: No focal liver abnormality is seen. Status post cholecystectomy. No biliary dilatation. Pancreas: Unremarkable. No pancreatic ductal dilatation or surrounding inflammatory changes. Spleen: Normal in size without focal abnormality. Adrenals/Urinary Tract: There is mild right-sided obstructive uropathy related to a 9 mm distal right ureteral calculus, reference image 64 of series 5. The left kidney is unremarkable. The bladder is decompressed but otherwise unremarkable. The adrenals are normal. Stomach/Bowel: No bowel obstruction or ileus. Normal appendix right lower quadrant. Scattered diverticulosis of the sigmoid colon without diverticulitis. Vascular/Lymphatic: Aortic atherosclerosis. No enlarged abdominal or pelvic lymph nodes. Reproductive: Status post hysterectomy. No adnexal masses. Other: No abdominal wall hernia or abnormality. No abdominopelvic ascites. Musculoskeletal: No acute or destructive bony lesions. Reconstructed images demonstrate no additional findings. IMPRESSION: 1. Right-sided obstructive uropathy related to a 9 mm distal right ureteral calculus. 2. Minimal sigmoid diverticulosis without diverticulitis. Electronically Signed   By: Randa Ngo M.D.   On: 02/01/2020 20:11    Procedures Procedures (including critical care time)  Medications Ordered in ED Medications  sodium chloride 0.9 % bolus 1,000 mL (1,000 mLs Intravenous New Bag/Given 02/01/20 2019)  morphine 4 MG/ML injection 4 mg (4 mg Intravenous Given 02/01/20 2019)  ondansetron (ZOFRAN) injection 4 mg (4 mg Intravenous Given 02/01/20 2019)    ED Course  I have reviewed the triage vital signs and the nursing notes.  Pertinent labs & imaging results that were available during my  care of the patient were reviewed by me and considered in my  medical decision making (see chart for details).    MDM Rules/Calculators/A&P                      Patient with lower abdominal pain, nausea, and vomiting.  Symptoms present for 2 days.  No reported dysuria or history of kidney stones.  CT this evening shows a 9 mm distal ureteral stone.  Urinalysis shows 21-50 white cells without nitrites or leukocytes.  Urine culture is pending  2120 consulted urology, Dr. Lovena Neighbours, he recommends to give patient IV Rocephin here and have her contact the office tomorrow to arrange follow-up appointment.  I will provide prescriptions for Flomax, antiemetic, and pain medication.  On recheck, patient is resting comfortably  Pain has improved and nausea has resolved. Return precautions discussed   Final Clinical Impression(s) / ED Diagnoses Final diagnoses:  Ureteral stone  Lower abdominal pain    Rx / DC Orders ED Discharge Orders    None       Bufford Lope 02/03/20 1238    Milton Ferguson, MD 02/04/20 [phone]

## 2020-02-01 NOTE — Telephone Encounter (Signed)
There is a blank message, what would you advise.

## 2020-02-01 NOTE — Discharge Instructions (Addendum)
Your CT this evening shows that you have a right-sided kidney stone.  Call Dr. Gilford Rile office tomorrow to arrange a follow-up appointment.  Follow-up with your primary doctor for recheck of your kidney functions later this week.  Return to the emergency department if you develop worsening symptoms such as increasing pain, persistent vomiting or fever.

## 2020-02-01 NOTE — Telephone Encounter (Signed)
Patient is calling and states she has had severe abdominal pain with throwing up since Saturday 2/20. She states her OBGYN did a ultrasound a couple weeks ago and her ovaries were fine. Patient would like recommendation.

## 2020-02-01 NOTE — Telephone Encounter (Signed)
Please advise 

## 2020-02-01 NOTE — ED Triage Notes (Signed)
PT c/o lower abdominal pain with nausea and vomiting x2 days with no urinary symptoms and normal BM today.

## 2020-02-02 ENCOUNTER — Other Ambulatory Visit: Payer: Self-pay | Admitting: Urology

## 2020-02-02 DIAGNOSIS — N201 Calculus of ureter: Secondary | ICD-10-CM | POA: Diagnosis not present

## 2020-02-02 MED FILL — Oxycodone w/ Acetaminophen Tab 5-325 MG: ORAL | Qty: 6 | Status: AC

## 2020-02-02 NOTE — Telephone Encounter (Signed)
Called pt and left message to schedule appointment.

## 2020-02-02 NOTE — Telephone Encounter (Signed)
Office visit

## 2020-02-03 ENCOUNTER — Other Ambulatory Visit (HOSPITAL_COMMUNITY)
Admission: RE | Admit: 2020-02-03 | Discharge: 2020-02-03 | Disposition: A | Discharge status: Home / Self Care | Payer: PPO | Source: Ambulatory Visit | Attending: Urology | Admitting: Urology

## 2020-02-03 DIAGNOSIS — Z01812 Encounter for preprocedural laboratory examination: Secondary | ICD-10-CM | POA: Diagnosis not present

## 2020-02-03 DIAGNOSIS — Z20822 Contact with and (suspected) exposure to covid-19: Secondary | ICD-10-CM | POA: Diagnosis not present

## 2020-02-03 LAB — SARS CORONAVIRUS 2 (TAT 6-24 HRS): SARS Coronavirus 2: NEGATIVE

## 2020-02-03 LAB — URINE CULTURE: Culture: <10000 — AB

## 2020-02-04 ENCOUNTER — Encounter (HOSPITAL_BASED_OUTPATIENT_CLINIC_OR_DEPARTMENT_OTHER): Payer: Self-pay | Admitting: Anesthesiology

## 2020-02-04 ENCOUNTER — Other Ambulatory Visit: Payer: Self-pay

## 2020-02-04 ENCOUNTER — Ambulatory Visit (HOSPITAL_COMMUNITY): Payer: PPO

## 2020-02-04 ENCOUNTER — Encounter (HOSPITAL_BASED_OUTPATIENT_CLINIC_OR_DEPARTMENT_OTHER)
Admission: RE | Disposition: A | Discharge status: Home / Self Care | Payer: Self-pay | Source: Home / Self Care | Attending: Urology

## 2020-02-04 ENCOUNTER — Ambulatory Visit (HOSPITAL_BASED_OUTPATIENT_CLINIC_OR_DEPARTMENT_OTHER)
Admission: RE | Admit: 2020-02-04 | Discharge: 2020-02-04 | Disposition: A | Discharge status: Home / Self Care | Payer: PPO | Attending: Urology | Admitting: Urology

## 2020-02-04 DIAGNOSIS — N202 Calculus of kidney with calculus of ureter: Secondary | ICD-10-CM | POA: Diagnosis present

## 2020-02-04 DIAGNOSIS — E119 Type 2 diabetes mellitus without complications: Secondary | ICD-10-CM | POA: Insufficient documentation

## 2020-02-04 DIAGNOSIS — J45909 Unspecified asthma, uncomplicated: Secondary | ICD-10-CM | POA: Diagnosis not present

## 2020-02-04 DIAGNOSIS — G4730 Sleep apnea, unspecified: Secondary | ICD-10-CM | POA: Insufficient documentation

## 2020-02-04 DIAGNOSIS — N201 Calculus of ureter: Secondary | ICD-10-CM | POA: Diagnosis not present

## 2020-02-04 DIAGNOSIS — Z79899 Other long term (current) drug therapy: Secondary | ICD-10-CM | POA: Diagnosis not present

## 2020-02-04 DIAGNOSIS — Z9049 Acquired absence of other specified parts of digestive tract: Secondary | ICD-10-CM | POA: Insufficient documentation

## 2020-02-04 DIAGNOSIS — Z6841 Body Mass Index (BMI) 40.0 and over, adult: Secondary | ICD-10-CM | POA: Diagnosis not present

## 2020-02-04 DIAGNOSIS — Z01818 Encounter for other preprocedural examination: Secondary | ICD-10-CM | POA: Diagnosis not present

## 2020-02-04 DIAGNOSIS — E669 Obesity, unspecified: Secondary | ICD-10-CM | POA: Diagnosis not present

## 2020-02-04 DIAGNOSIS — I10 Essential (primary) hypertension: Secondary | ICD-10-CM | POA: Diagnosis not present

## 2020-02-04 HISTORY — PX: EXTRACORPOREAL SHOCK WAVE LITHOTRIPSY: SHX1557

## 2020-02-04 LAB — GLUCOSE, CAPILLARY
Glucose-Capillary: 124 mg/dL — ABNORMAL HIGH (ref 70–99)
Glucose-Capillary: 95 mg/dL (ref 70–99)

## 2020-02-04 SURGERY — LITHOTRIPSY, ESWL
Anesthesia: LOCAL | Laterality: Right

## 2020-02-04 MED ORDER — CIPROFLOXACIN HCL 500 MG PO TABS
500.0000 mg | ORAL_TABLET | ORAL | Status: AC
  Administered 2020-02-04: 12:00:00 500 mg via ORAL
  Filled 2020-02-04: qty 1

## 2020-02-04 MED ORDER — DIAZEPAM 5 MG PO TABS
10.0000 mg | ORAL_TABLET | ORAL | Status: AC
  Administered 2020-02-04: 10 mg via ORAL
  Filled 2020-02-04: qty 2

## 2020-02-04 MED ORDER — DIPHENHYDRAMINE HCL 25 MG PO CAPS
ORAL_CAPSULE | ORAL | Status: AC
  Filled 2020-02-04: qty 1

## 2020-02-04 MED ORDER — DIAZEPAM 5 MG PO TABS
ORAL_TABLET | ORAL | Status: AC
  Filled 2020-02-04: qty 2

## 2020-02-04 MED ORDER — SODIUM CHLORIDE 0.9 % IV SOLN
INTRAVENOUS | Status: DC
  Filled 2020-02-04: qty 1000

## 2020-02-04 MED ORDER — DIPHENHYDRAMINE HCL 25 MG PO CAPS
25.0000 mg | ORAL_CAPSULE | ORAL | Status: AC
  Administered 2020-02-04: 12:00:00 25 mg via ORAL
  Filled 2020-02-04: qty 1

## 2020-02-04 MED ORDER — CIPROFLOXACIN HCL 500 MG PO TABS
ORAL_TABLET | ORAL | Status: AC
  Filled 2020-02-04: qty 1

## 2020-02-04 NOTE — H&P (Signed)
CC: Acute Kidney Stone  HPI: Hannah Schultz is a 59 year-old female patient who is here for further eval and management of kidney stones.  She was diagnosed with a kidney stone on 02/01/2020. The patient presented to [facility] with symptoms of a kidney stone.   Her pain started about approximately 12/11/2019. The pain is on the right side.   Abdomen/Pelvic CT: 02/01/20: Right-sided obstructive uropathy related to a 9 mm distal right ureteral calculus; minimal sigmoid diverticulosis w/o diverticulitis. The patient underwent CT scan prior to today's appointment.   The patient relates initially having nausea, vomiting, flank pain, and voiding symptoms. She is currently having flank pain and back pain. She denies having groin pain, nausea, vomiting, fever, chills, and voiding symptoms. She has not caught a stone in her urine strainer since her symptoms began.   She has never had surgical treatment for calculi in the past. This is her first kidney stone.   The patient was seen in the emergency room yesterday and has had severe pain and vomiting last night. However, the pain seems to have improved slightly today. She denies any fevers or chills. She denies any dysuria.     ALLERGIES: None   MEDICATIONS: Lisinopril 10 mg tablet  Metoprolol Succinate 50 mg tablet, extended release 24 hr  Simvastatin 40 mg tablet  Famotidine 20 mg tablet  Novolin N  Novolin R  Promethazine Hcl 25 mg tablet  Vitamin D     GU PSH: Hysterectomy     NON-GU PSH: Remove Gallbladder Tonsillectomy     GU PMH: None   NON-GU PMH: Depression Diabetes Type 2 GERD Hypercholesterolemia Hypertension Sleep Apnea    FAMILY HISTORY: 2 sons - Son Kidney Cancer - Brother leukemia - Father stroke - Mother   SOCIAL HISTORY: Marital Status: Married Preferred Language: English; Ethnicity: Not Hispanic Or Latino; Race: Black or African American Has never drank.  Drinks 2 caffeinated drinks per day. Patient's  occupation is/was Retired/disabled.    REVIEW OF SYSTEMS:    GU Review Female:   Patient reports frequent urination and get up at night to urinate. Patient denies hard to postpone urination, burning /pain with urination, leakage of urine, stream starts and stops, trouble starting your stream, have to strain to urinate, and being pregnant.  Gastrointestinal (Upper):   Patient reports nausea, vomiting, and indigestion/ heartburn.   Gastrointestinal (Lower):   Patient reports diarrhea. Patient denies constipation.  Constitutional:   Patient denies fever, night sweats, weight loss, and fatigue.  Skin:   Patient reports skin rash/ lesion. Patient denies itching.  Eyes:   Patient reports blurred vision. Patient denies double vision.  Ears/ Nose/ Throat:   Patient reports sinus problems. Patient denies sore throat.  Hematologic/Lymphatic:   Patient reports easy bruising. Patient denies swollen glands.  Cardiovascular:   Patient denies leg swelling and chest pains.  Respiratory:   Patient denies cough and shortness of breath.  Endocrine:   Patient reports excessive thirst.   Musculoskeletal:   Patient reports back pain and joint pain.   Neurological:   Patient denies headaches and dizziness.  Psychologic:   Patient reports depression. Patient denies anxiety.   VITAL SIGNS:      02/02/2020 12:37 PM  Weight 274 lb / 124.28 kg  Height 64 in / 162.56 cm  BP 118/78 mmHg  Pulse 96 /min  Temperature 97.5 F / 36.3 C  BMI 47.0 kg/m   MULTI-SYSTEM PHYSICAL EXAMINATION:    Constitutional: Well-nourished. No physical deformities. Normally  developed. Good grooming.  Neck: Neck symmetrical, not swollen. Normal tracheal position.  Respiratory: Normal breath sounds. No labored breathing, no use of accessory muscles.   Cardiovascular: Regular rate and rhythm. No murmur, no gallop. Normal temperature, normal extremity pulses, no swelling, no varicosities.   Lymphatic: No enlargement of neck, axillae, groin.   Skin: No paleness, no jaundice, no cyanosis. No lesion, no ulcer, no rash.  Neurologic / Psychiatric: Oriented to time, oriented to place, oriented to person. No depression, no anxiety, no agitation.  Gastrointestinal: No mass, no tenderness, no rigidity, non obese abdomen.  Eyes: Normal conjunctivae. Normal eyelids.  Ears, Nose, Mouth, and Throat: Left ear no scars, no lesions, no masses. Right ear no scars, no lesions, no masses. Nose no scars, no lesions, no masses. Normal hearing. Normal lips.  Musculoskeletal: Normal gait and station of head and neck. CVA tenderness in right kidney     PAST DATA REVIEWED:  Source Of History:  Patient  Lab Test Review:   PSA  Records Review:   Previous Doctor Records, Previous Patient Records, POC Tool  Urine Test Review:   Urinalysis  X-Ray Review: C.T. Abdomen/Pelvis: Reviewed Films. Discussed With Patient.     PROCEDURES:          Urinalysis w/Scope Dipstick Dipstick Cont'd Micro  Color: Yellow Bilirubin: Neg mg/dL WBC/hpf: 0 - 5/hpf  Appearance: Slightly Cloudy Ketones: Neg mg/dL RBC/hpf: 20 - 40/hpf  Specific Gravity: 1.020 Blood: 3+ ery/uL Bacteria: Few (10-25/hpf)  pH: 5.5 Protein: 3+ mg/dL Cystals: Amorph Urates  Glucose: Neg mg/dL Urobilinogen: 0.2 mg/dL Casts: NS (Not Seen)    Nitrites: Neg Trichomonas: Not Present    Leukocyte Esterase: Neg leu/uL Mucous: Present      Epithelial Cells: 0 - 5/hpf      Yeast: NS (Not Seen)      Sperm: Not Present    ASSESSMENT:      ICD-10 Details  1 GU:   Ureteral calculus - N20.1    PLAN:            Medications New Meds: Ultram 50 mg tablet 1-2 tablet PO Q 6 H PRN   #20  0 Refill(s)  Zofran 4 mg tablet 1 tablet PO Q 4 H PRN   #20  1 Refill(s)            Document Letter(s):  Created for Patient: Clinical Summary         Notes:   The patient has a 9 mm distal right ureteral stone that is visualized on the scout film of her CT scan. She does have a significant skin to stone distance but  the Hounsfield units are favorable for good fragmentation. We discussed treatment options and she has opted to proceed with shockwave lithotripsy. I did give her some return precautions. I also discussed the failure rate I told her that she had a 25-35% chance of needing additional procedures or at least not clearing or stones right away. Having heard all this the patient opted to proceed shockwave lithotripsy. Will get her scheduled in the near future.   Cc: Benny Lennert, MD

## 2020-02-04 NOTE — Discharge Instructions (Signed)
See [facility] discharge instructions in chart.   Post Anesthesia Home Care Instructions  Activity: Get plenty of rest for the remainder of the day. A responsible individual must stay with you for 24 hours following the procedure.  For the next 24 hours, DO NOT: -Drive a car -Paediatric nurse -Drink alcoholic beverages -Take any medication unless instructed by your physician -Make any legal decisions or sign important papers.  Meals: Start with liquid foods such as gelatin or soup. Progress to regular foods as tolerated. Avoid greasy, spicy, heavy foods. If nausea and/or vomiting occur, drink only clear liquids until the nausea and/or vomiting subsides. Call your physician if vomiting continues.  Special Instructions/Symptoms: Your throat may feel dry or sore from the anesthesia or the breathing tube placed in your throat during surgery. If this causes discomfort, gargle with warm salt water. The discomfort should disappear within 24 hours.  If you had a scopolamine patch placed behind your ear for the management of post- operative nausea and/or vomiting:  1. The medication in the patch is effective for 72 hours, after which it should be removed.  Wrap patch in a tissue and discard in the trash. Wash hands thoroughly with soap and water. 2. You may remove the patch earlier than 72 hours if you experience unpleasant side effects which may include dry mouth, dizziness or visual disturbances. 3. Avoid touching the patch. Wash your hands with soap and water after contact with the patch.

## 2020-02-04 NOTE — Op Note (Signed)
See [facility] OP note scanned into chart. Also because of the size, density, location and other factors that cannot be anticipated I feel this will likely be a staged procedure. This fact supersedes any indication in the scanned [facility] operative note to the contrary.  

## 2020-02-08 DIAGNOSIS — M47816 Spondylosis without myelopathy or radiculopathy, lumbar region: Secondary | ICD-10-CM | POA: Diagnosis not present

## 2020-02-18 DIAGNOSIS — N201 Calculus of ureter: Secondary | ICD-10-CM | POA: Diagnosis not present

## 2020-02-23 DIAGNOSIS — M5116 Intervertebral disc disorders with radiculopathy, lumbar region: Secondary | ICD-10-CM | POA: Diagnosis not present

## 2020-02-23 DIAGNOSIS — M47816 Spondylosis without myelopathy or radiculopathy, lumbar region: Secondary | ICD-10-CM | POA: Diagnosis not present

## 2020-03-02 DIAGNOSIS — R2689 Other abnormalities of gait and mobility: Secondary | ICD-10-CM | POA: Diagnosis not present

## 2020-03-02 DIAGNOSIS — R293 Abnormal posture: Secondary | ICD-10-CM | POA: Diagnosis not present

## 2020-03-02 DIAGNOSIS — M545 Low back pain: Secondary | ICD-10-CM | POA: Diagnosis not present

## 2020-03-02 DIAGNOSIS — R531 Weakness: Secondary | ICD-10-CM | POA: Diagnosis not present

## 2020-03-04 DIAGNOSIS — R531 Weakness: Secondary | ICD-10-CM | POA: Diagnosis not present

## 2020-03-04 DIAGNOSIS — M545 Low back pain: Secondary | ICD-10-CM | POA: Diagnosis not present

## 2020-03-04 DIAGNOSIS — R293 Abnormal posture: Secondary | ICD-10-CM | POA: Diagnosis not present

## 2020-03-04 DIAGNOSIS — R2689 Other abnormalities of gait and mobility: Secondary | ICD-10-CM | POA: Diagnosis not present

## 2020-03-10 DIAGNOSIS — N200 Calculus of kidney: Secondary | ICD-10-CM | POA: Diagnosis not present

## 2020-03-11 DIAGNOSIS — R531 Weakness: Secondary | ICD-10-CM | POA: Diagnosis not present

## 2020-03-11 DIAGNOSIS — R293 Abnormal posture: Secondary | ICD-10-CM | POA: Diagnosis not present

## 2020-03-11 DIAGNOSIS — M545 Low back pain: Secondary | ICD-10-CM | POA: Diagnosis not present

## 2020-03-11 DIAGNOSIS — R2689 Other abnormalities of gait and mobility: Secondary | ICD-10-CM | POA: Diagnosis not present

## 2020-03-14 DIAGNOSIS — R2689 Other abnormalities of gait and mobility: Secondary | ICD-10-CM | POA: Diagnosis not present

## 2020-03-14 DIAGNOSIS — M545 Low back pain: Secondary | ICD-10-CM | POA: Diagnosis not present

## 2020-03-14 DIAGNOSIS — R531 Weakness: Secondary | ICD-10-CM | POA: Diagnosis not present

## 2020-03-14 DIAGNOSIS — R293 Abnormal posture: Secondary | ICD-10-CM | POA: Diagnosis not present

## 2020-03-17 DIAGNOSIS — M545 Low back pain: Secondary | ICD-10-CM | POA: Diagnosis not present

## 2020-03-17 DIAGNOSIS — R2689 Other abnormalities of gait and mobility: Secondary | ICD-10-CM | POA: Diagnosis not present

## 2020-03-17 DIAGNOSIS — R531 Weakness: Secondary | ICD-10-CM | POA: Diagnosis not present

## 2020-03-17 DIAGNOSIS — R293 Abnormal posture: Secondary | ICD-10-CM | POA: Diagnosis not present

## 2020-03-22 DIAGNOSIS — M545 Low back pain: Secondary | ICD-10-CM | POA: Diagnosis not present

## 2020-03-22 DIAGNOSIS — R2689 Other abnormalities of gait and mobility: Secondary | ICD-10-CM | POA: Diagnosis not present

## 2020-03-22 DIAGNOSIS — R531 Weakness: Secondary | ICD-10-CM | POA: Diagnosis not present

## 2020-03-22 DIAGNOSIS — R293 Abnormal posture: Secondary | ICD-10-CM | POA: Diagnosis not present

## 2020-03-24 DIAGNOSIS — D860 Sarcoidosis of lung: Secondary | ICD-10-CM | POA: Diagnosis not present

## 2020-03-24 DIAGNOSIS — I10 Essential (primary) hypertension: Secondary | ICD-10-CM | POA: Diagnosis not present

## 2020-03-24 DIAGNOSIS — N189 Chronic kidney disease, unspecified: Secondary | ICD-10-CM | POA: Diagnosis not present

## 2020-03-24 DIAGNOSIS — E1140 Type 2 diabetes mellitus with diabetic neuropathy, unspecified: Secondary | ICD-10-CM | POA: Diagnosis not present

## 2020-03-24 DIAGNOSIS — E7800 Pure hypercholesterolemia, unspecified: Secondary | ICD-10-CM | POA: Diagnosis not present

## 2020-03-24 DIAGNOSIS — E1165 Type 2 diabetes mellitus with hyperglycemia: Secondary | ICD-10-CM | POA: Diagnosis not present

## 2020-03-25 DIAGNOSIS — R531 Weakness: Secondary | ICD-10-CM | POA: Diagnosis not present

## 2020-03-25 DIAGNOSIS — M545 Low back pain: Secondary | ICD-10-CM | POA: Diagnosis not present

## 2020-03-25 DIAGNOSIS — R293 Abnormal posture: Secondary | ICD-10-CM | POA: Diagnosis not present

## 2020-03-25 DIAGNOSIS — R2689 Other abnormalities of gait and mobility: Secondary | ICD-10-CM | POA: Diagnosis not present

## 2020-03-30 DIAGNOSIS — E1129 Type 2 diabetes mellitus with other diabetic kidney complication: Secondary | ICD-10-CM | POA: Diagnosis not present

## 2020-03-30 DIAGNOSIS — E1122 Type 2 diabetes mellitus with diabetic chronic kidney disease: Secondary | ICD-10-CM | POA: Diagnosis not present

## 2020-03-30 DIAGNOSIS — N189 Chronic kidney disease, unspecified: Secondary | ICD-10-CM | POA: Diagnosis not present

## 2020-03-30 DIAGNOSIS — D631 Anemia in chronic kidney disease: Secondary | ICD-10-CM | POA: Diagnosis not present

## 2020-03-30 DIAGNOSIS — R809 Proteinuria, unspecified: Secondary | ICD-10-CM | POA: Diagnosis not present

## 2020-03-30 DIAGNOSIS — I129 Hypertensive chronic kidney disease with stage 1 through stage 4 chronic kidney disease, or unspecified chronic kidney disease: Secondary | ICD-10-CM | POA: Diagnosis not present

## 2020-04-04 DIAGNOSIS — E785 Hyperlipidemia, unspecified: Secondary | ICD-10-CM | POA: Diagnosis not present

## 2020-04-04 DIAGNOSIS — I129 Hypertensive chronic kidney disease with stage 1 through stage 4 chronic kidney disease, or unspecified chronic kidney disease: Secondary | ICD-10-CM | POA: Diagnosis not present

## 2020-04-04 DIAGNOSIS — Z794 Long term (current) use of insulin: Secondary | ICD-10-CM | POA: Diagnosis not present

## 2020-04-04 DIAGNOSIS — F329 Major depressive disorder, single episode, unspecified: Secondary | ICD-10-CM | POA: Diagnosis not present

## 2020-04-04 DIAGNOSIS — Z6841 Body Mass Index (BMI) 40.0 and over, adult: Secondary | ICD-10-CM | POA: Diagnosis not present

## 2020-04-04 DIAGNOSIS — D860 Sarcoidosis of lung: Secondary | ICD-10-CM | POA: Diagnosis not present

## 2020-04-04 DIAGNOSIS — K219 Gastro-esophageal reflux disease without esophagitis: Secondary | ICD-10-CM | POA: Diagnosis not present

## 2020-04-04 DIAGNOSIS — E1122 Type 2 diabetes mellitus with diabetic chronic kidney disease: Secondary | ICD-10-CM | POA: Diagnosis not present

## 2020-04-04 DIAGNOSIS — E1142 Type 2 diabetes mellitus with diabetic polyneuropathy: Secondary | ICD-10-CM | POA: Diagnosis not present

## 2020-04-04 DIAGNOSIS — N183 Chronic kidney disease, stage 3 unspecified: Secondary | ICD-10-CM | POA: Diagnosis not present

## 2020-04-04 DIAGNOSIS — F419 Anxiety disorder, unspecified: Secondary | ICD-10-CM | POA: Diagnosis not present

## 2020-04-07 DIAGNOSIS — D631 Anemia in chronic kidney disease: Secondary | ICD-10-CM | POA: Diagnosis not present

## 2020-04-07 DIAGNOSIS — E1129 Type 2 diabetes mellitus with other diabetic kidney complication: Secondary | ICD-10-CM | POA: Diagnosis not present

## 2020-04-07 DIAGNOSIS — R809 Proteinuria, unspecified: Secondary | ICD-10-CM | POA: Diagnosis not present

## 2020-04-07 DIAGNOSIS — I129 Hypertensive chronic kidney disease with stage 1 through stage 4 chronic kidney disease, or unspecified chronic kidney disease: Secondary | ICD-10-CM | POA: Diagnosis not present

## 2020-04-07 DIAGNOSIS — N189 Chronic kidney disease, unspecified: Secondary | ICD-10-CM | POA: Diagnosis not present

## 2020-04-07 DIAGNOSIS — E1122 Type 2 diabetes mellitus with diabetic chronic kidney disease: Secondary | ICD-10-CM | POA: Diagnosis not present

## 2020-04-18 DIAGNOSIS — R4702 Dysphasia: Secondary | ICD-10-CM | POA: Diagnosis not present

## 2020-04-18 DIAGNOSIS — I10 Essential (primary) hypertension: Secondary | ICD-10-CM | POA: Diagnosis not present

## 2020-04-18 DIAGNOSIS — K2100 Gastro-esophageal reflux disease with esophagitis, without bleeding: Secondary | ICD-10-CM | POA: Diagnosis not present

## 2020-04-18 DIAGNOSIS — K2900 Acute gastritis without bleeding: Secondary | ICD-10-CM | POA: Diagnosis not present

## 2020-05-24 DIAGNOSIS — M5116 Intervertebral disc disorders with radiculopathy, lumbar region: Secondary | ICD-10-CM | POA: Diagnosis not present

## 2020-05-31 DIAGNOSIS — D631 Anemia in chronic kidney disease: Secondary | ICD-10-CM | POA: Diagnosis not present

## 2020-05-31 DIAGNOSIS — R809 Proteinuria, unspecified: Secondary | ICD-10-CM | POA: Diagnosis not present

## 2020-05-31 DIAGNOSIS — N189 Chronic kidney disease, unspecified: Secondary | ICD-10-CM | POA: Diagnosis not present

## 2020-05-31 DIAGNOSIS — E1122 Type 2 diabetes mellitus with diabetic chronic kidney disease: Secondary | ICD-10-CM | POA: Diagnosis not present

## 2020-05-31 DIAGNOSIS — I129 Hypertensive chronic kidney disease with stage 1 through stage 4 chronic kidney disease, or unspecified chronic kidney disease: Secondary | ICD-10-CM | POA: Diagnosis not present

## 2020-05-31 DIAGNOSIS — E1129 Type 2 diabetes mellitus with other diabetic kidney complication: Secondary | ICD-10-CM | POA: Diagnosis not present

## 2020-06-03 DIAGNOSIS — E1122 Type 2 diabetes mellitus with diabetic chronic kidney disease: Secondary | ICD-10-CM | POA: Diagnosis not present

## 2020-06-03 DIAGNOSIS — R809 Proteinuria, unspecified: Secondary | ICD-10-CM | POA: Diagnosis not present

## 2020-06-03 DIAGNOSIS — R0602 Shortness of breath: Secondary | ICD-10-CM | POA: Diagnosis not present

## 2020-06-03 DIAGNOSIS — I129 Hypertensive chronic kidney disease with stage 1 through stage 4 chronic kidney disease, or unspecified chronic kidney disease: Secondary | ICD-10-CM | POA: Diagnosis not present

## 2020-06-03 DIAGNOSIS — E1129 Type 2 diabetes mellitus with other diabetic kidney complication: Secondary | ICD-10-CM | POA: Diagnosis not present

## 2020-06-03 DIAGNOSIS — F4320 Adjustment disorder, unspecified: Secondary | ICD-10-CM | POA: Diagnosis not present

## 2020-06-03 DIAGNOSIS — N189 Chronic kidney disease, unspecified: Secondary | ICD-10-CM | POA: Diagnosis not present

## 2020-06-07 ENCOUNTER — Other Ambulatory Visit (HOSPITAL_COMMUNITY): Payer: Self-pay | Admitting: Nephrology

## 2020-06-07 DIAGNOSIS — N186 End stage renal disease: Secondary | ICD-10-CM

## 2020-06-07 DIAGNOSIS — I129 Hypertensive chronic kidney disease with stage 1 through stage 4 chronic kidney disease, or unspecified chronic kidney disease: Secondary | ICD-10-CM

## 2020-06-07 DIAGNOSIS — E1122 Type 2 diabetes mellitus with diabetic chronic kidney disease: Secondary | ICD-10-CM

## 2020-06-07 DIAGNOSIS — R0602 Shortness of breath: Secondary | ICD-10-CM

## 2020-06-07 DIAGNOSIS — N189 Chronic kidney disease, unspecified: Secondary | ICD-10-CM

## 2020-06-08 ENCOUNTER — Other Ambulatory Visit: Payer: Self-pay

## 2020-06-08 ENCOUNTER — Ambulatory Visit (HOSPITAL_COMMUNITY)
Admission: RE | Admit: 2020-06-08 | Discharge: 2020-06-08 | Disposition: A | Discharge status: Home / Self Care | Payer: PPO | Source: Ambulatory Visit | Attending: Nephrology | Admitting: Nephrology

## 2020-06-08 DIAGNOSIS — E1122 Type 2 diabetes mellitus with diabetic chronic kidney disease: Secondary | ICD-10-CM | POA: Insufficient documentation

## 2020-06-08 DIAGNOSIS — E785 Hyperlipidemia, unspecified: Secondary | ICD-10-CM | POA: Insufficient documentation

## 2020-06-08 DIAGNOSIS — R0602 Shortness of breath: Secondary | ICD-10-CM | POA: Diagnosis not present

## 2020-06-08 DIAGNOSIS — I1310 Hypertensive heart and chronic kidney disease without heart failure, with stage 1 through stage 4 chronic kidney disease, or unspecified chronic kidney disease: Secondary | ICD-10-CM | POA: Diagnosis not present

## 2020-06-08 DIAGNOSIS — N189 Chronic kidney disease, unspecified: Secondary | ICD-10-CM

## 2020-06-08 DIAGNOSIS — G4730 Sleep apnea, unspecified: Secondary | ICD-10-CM | POA: Diagnosis not present

## 2020-06-08 DIAGNOSIS — N186 End stage renal disease: Secondary | ICD-10-CM | POA: Insufficient documentation

## 2020-06-08 DIAGNOSIS — I129 Hypertensive chronic kidney disease with stage 1 through stage 4 chronic kidney disease, or unspecified chronic kidney disease: Secondary | ICD-10-CM | POA: Diagnosis not present

## 2020-06-08 DIAGNOSIS — I358 Other nonrheumatic aortic valve disorders: Secondary | ICD-10-CM | POA: Diagnosis not present

## 2020-06-08 NOTE — Progress Notes (Signed)
Echocardiogram 2D Echocardiogram has been performed.  [place] Deputy Adoria Kawamoto 06/08/2020, 11:09 AM

## 2020-06-23 DIAGNOSIS — M47816 Spondylosis without myelopathy or radiculopathy, lumbar region: Secondary | ICD-10-CM | POA: Diagnosis not present

## 2020-06-27 DIAGNOSIS — L818 Other specified disorders of pigmentation: Secondary | ICD-10-CM | POA: Diagnosis not present

## 2020-06-27 DIAGNOSIS — L700 Acne vulgaris: Secondary | ICD-10-CM | POA: Diagnosis not present

## 2020-06-28 DIAGNOSIS — K2100 Gastro-esophageal reflux disease with esophagitis, without bleeding: Secondary | ICD-10-CM | POA: Diagnosis not present

## 2020-06-28 DIAGNOSIS — I129 Hypertensive chronic kidney disease with stage 1 through stage 4 chronic kidney disease, or unspecified chronic kidney disease: Secondary | ICD-10-CM | POA: Diagnosis not present

## 2020-06-28 DIAGNOSIS — R4702 Dysphasia: Secondary | ICD-10-CM | POA: Diagnosis not present

## 2020-06-28 DIAGNOSIS — N183 Chronic kidney disease, stage 3 unspecified: Secondary | ICD-10-CM | POA: Diagnosis not present

## 2020-06-28 DIAGNOSIS — M5441 Lumbago with sciatica, right side: Secondary | ICD-10-CM | POA: Diagnosis not present

## 2020-06-28 DIAGNOSIS — F419 Anxiety disorder, unspecified: Secondary | ICD-10-CM | POA: Diagnosis not present

## 2020-06-28 DIAGNOSIS — G8929 Other chronic pain: Secondary | ICD-10-CM | POA: Diagnosis not present

## 2020-06-28 DIAGNOSIS — E785 Hyperlipidemia, unspecified: Secondary | ICD-10-CM | POA: Diagnosis not present

## 2020-06-28 DIAGNOSIS — F329 Major depressive disorder, single episode, unspecified: Secondary | ICD-10-CM | POA: Diagnosis not present

## 2020-06-28 DIAGNOSIS — E1142 Type 2 diabetes mellitus with diabetic polyneuropathy: Secondary | ICD-10-CM | POA: Diagnosis not present

## 2020-06-28 DIAGNOSIS — M5442 Lumbago with sciatica, left side: Secondary | ICD-10-CM | POA: Diagnosis not present

## 2020-07-08 ENCOUNTER — Other Ambulatory Visit (HOSPITAL_COMMUNITY): Payer: Self-pay | Admitting: Nephrology

## 2020-07-08 DIAGNOSIS — R0602 Shortness of breath: Secondary | ICD-10-CM

## 2020-07-11 ENCOUNTER — Ambulatory Visit: Payer: PPO | Admitting: Pulmonary Disease

## 2020-07-11 ENCOUNTER — Other Ambulatory Visit: Payer: Self-pay

## 2020-07-11 ENCOUNTER — Encounter: Payer: Self-pay | Admitting: Pulmonary Disease

## 2020-07-11 ENCOUNTER — Ambulatory Visit (HOSPITAL_COMMUNITY)
Admission: RE | Admit: 2020-07-11 | Discharge: 2020-07-11 | Disposition: A | Discharge status: Home / Self Care | Payer: PPO | Source: Ambulatory Visit | Attending: Nephrology | Admitting: Nephrology

## 2020-07-11 VITALS — BP 130/70 | HR 65 | Temp 98.7°F | Ht 64.0 in | Wt 285.0 lb

## 2020-07-11 DIAGNOSIS — D869 Sarcoidosis, unspecified: Secondary | ICD-10-CM | POA: Diagnosis not present

## 2020-07-11 DIAGNOSIS — G4733 Obstructive sleep apnea (adult) (pediatric): Secondary | ICD-10-CM | POA: Diagnosis not present

## 2020-07-11 DIAGNOSIS — R0602 Shortness of breath: Secondary | ICD-10-CM | POA: Diagnosis not present

## 2020-07-11 NOTE — Progress Notes (Signed)
Subjective:    Patient ID: Hannah Schultz, female    DOB: 23-Aug-1961, 59 y.o..   MRN: [id number]  HPI  59 year old obese woman presents to establish care for OSA and sarcoidosis. She presented in 2018 with weight loss and CT scan showing mediastinal and abdominal lymphadenopathy, underwent mediastinoscopy by Dr. Roxan Hockey which showed caseating granulomas, stains for AFB and fungus were negative. She was treated with prednisone for several months which caused significant weight gain.  I note chest x-ray from 07/2019 which does not show any lymphadenopathy.  She denies wheezing or coughing but does report dyspnea on exertion after walking about 200 yards, cannot climb stairs.  OSA was diagnosed in 2018 with a split-night study but for some reason she was not started on CPAP.  She reports frequent nocturnal awakenings, loud snoring has been noted by family members and witnessed apneas.  She reports nightmares due to PTSD. Epworth sleepiness score is 15 and she is falling asleep whenever she can, grandchildren keep her busy and no daytime naps. Bedtime is between 10 PM and midnight, sleep latency can be 30 minutes or less, she sleeps on her right side with 2 pillows, reports 3 nocturnal awakenings including nocturia and is out of bed around 10 AM feeling tired without dryness of mouth or headaches. She has gained 40 pounds over the last 2 years There is no history suggestive of cataplexy, sleep paralysis or parasomnias  PMH -CKD, renal calculi, insulin requiring diabetes   Significant tests/ events reviewed  CT abdomen 01/2020 >> Right-sided obstructive uropathy related to a 9 mm distal right ureteral calculus. CT chest W con 01/2017 >> Enlarged mediastinal and bilateral hilar nodes, measuring up to 2.2 cm. Enlarged nodes at the upper abdomen, about the celiac trunk.  PET 06/6807 hypermetabolic LNs, sub cm pulm nodules neg Several hypermetabolic foci along the bilateral pleural spaces and  posterior ribs, without corresponding abnormality on CT images. These may be due to pleural or osseous involvement.  NPSG  Split 02/2017 >> severe, AHI 81/h >> CPAP 17 cm, med FF mask  Past Medical History:  Diagnosis Date  . Acid reflux   . Arthritis   . Bulging lumbar disc   . Cataract   . Depression   . Diabetes mellitus   . Diabetic neuropathy ([place])   . High cholesterol   . Hypertension   . Post traumatic stress disorder (PTSD)   . Sleep apnea     Past Surgical History:  Procedure Laterality Date  . ABDOMINAL HYSTERECTOMY    . CHOLECYSTECTOMY    . COLONOSCOPY    . EXTRACORPOREAL SHOCK WAVE LITHOTRIPSY Right 02/04/2020   Procedure: EXTRACORPOREAL SHOCK WAVE LITHOTRIPSY (ESWL);  Surgeon: Robley Fries, MD;  Location: [facility];  Service: Urology;  Laterality: Right;  . INCISION AND DRAINAGE ABSCESS N/A 02/06/2018   Procedure: INCISION AND DRAINAGE vulvar ABSCESS;  Surgeon: Marylynn Pearson, MD;  Location: [facility];  Service: Gynecology;  Laterality: N/A;  . MEDIASTINOSCOPY N/A 04/25/2017   Procedure: MEDIASTINOSCOPY;  Surgeon: Melrose Nakayama, MD;  Location: [place];  Service: Thoracic;  Laterality: N/A;  . TONSILLECTOMY  04/02/2012   Procedure: TONSILLECTOMY;  Surgeon: Ascencion Dike, MD;  Location: [facility] [facility];  Service: ENT;  Laterality: Bilateral;    Allergies  Allergen Reactions  . Adhesive [Tape] Other (See Comments)    Adhesive tape "tears skin"    Social History   Socioeconomic History  . Marital status: Married    Spouse name:  Not on file  . Number of children: Not on file  . Years of education: Not on file  . Highest education level: Not on file  Occupational History  . Not on file  Tobacco Use  . Smoking status: Never Smoker  . Smokeless tobacco: Never Used  Vaping Use  . Vaping Use: Never used  Substance and Sexual Activity  . Alcohol use: No  . Drug use: No  . Sexual activity: Yes    Birth control/protection: Surgical  Other Topics  Concern  . Not on file  Social History Narrative  . Not on file   Social Determinants of Health   Financial Resource Strain:   . Difficulty of Paying Living Expenses:   Food Insecurity:   . Worried About Charity fundraiser in the Last Year:   . Arboriculturist in the Last Year:   Transportation Needs:   . Film/video editor (Medical):   Marland Kitchen Lack of Transportation (Non-Medical):   Physical Activity:   . Days of Exercise per Week:   . Minutes of Exercise per Session:   Stress:   . Feeling of Stress :   Social Connections:   . Frequency of Communication with Friends and Family:   . Frequency of Social Gatherings with Friends and Family:   . Attends Religious Services:   . Active Member of Clubs or Organizations:   . Attends Archivist Meetings:   Marland Kitchen Marital Status:   Intimate Partner Violence:   . Fear of Current or Ex-Partner:   . Emotionally Abused:   Marland Kitchen Physically Abused:   . Sexually Abused:      Family History  Problem Relation Age of Onset  . Heart disease Other   . Cancer Other   . Diabetes Other   . Stroke Mother   . Heart attack Mother   . Heart attack Father   . Leukemia Father   . Heart attack Sister   . Stroke Sister   . Kidney cancer Brother   . Aneurysm Brother   . Diabetes Brother   . Diabetes Brother      Review of Systems  Constitutional: negative for anorexia, fevers and sweats  Eyes: negative for irritation, redness and visual disturbance  Ears, nose, mouth, throat, and face: negative for earaches, epistaxis, nasal congestion and sore throat  Respiratory: negative for cough,  sputum and wheezing  Cardiovascular: negative for chest pain,lower extremity edema, orthopnea, palpitations and syncope  Gastrointestinal: negative for abdominal pain, constipation, diarrhea, melena, nausea and vomiting  Genitourinary:negative for dysuria, frequency and hematuria  Hematologic/lymphatic: negative for bleeding, easy bruising and  lymphadenopathy  Musculoskeletal:negative for arthralgias, muscle weakness and stiff joints  Neurological: negative for coordination problems, gait problems, headaches and weakness  Endocrine: negative for polydipsia, polyuria and weight loss     Objective:   Physical Exam  Gen. Pleasant, obese, in no distress, normal affect ENT - no pallor,icterus, no post nasal drip, class 2-3 airway Neck: No JVD, no thyromegaly, no carotid bruits Lungs: no use of accessory muscles, no dullness to percussion, decreased without rales or rhonchi  Cardiovascular: Rhythm regular, heart sounds  normal, no murmurs or gallops, no peripheral edema Abdomen: soft and non-tender, no hepatosplenomegaly, BS normal. Musculoskeletal: No deformities, no cyanosis or clubbing Neuro:  alert, non focal, no tremors       Assessment & Plan:

## 2020-07-11 NOTE — Addendum Note (Signed)
Addended by: Lia Foyer R on: 07/11/2020 10:05 AM   Modules accepted: Orders

## 2020-07-11 NOTE — Assessment & Plan Note (Signed)
This is severe and poses significant cardiovascular risk. The pathophysiology of obstructive sleep apnea , it's cardiovascular consequences & modes of treatment including CPAP were discused with the patient in detail & they evidenced understanding.  For some reason she was not tried on CPAP after her study.  We will send in prescription for auto CPAP 10 to 18 cm with medium full facemask.  Compliance issues were emphasized, benefits of CPAP were discussed  Weight loss encouraged, compliance with goal of at least 4-6 hrs every night is the expectation. Advised against medications with sedative side effects Cautioned against driving when sleepy - understanding that sleepiness will vary on a day to day basis

## 2020-07-11 NOTE — Assessment & Plan Note (Signed)
Biopsy-proven.  Appears to be in remission, last chest x-ray 07/2019 does not show any lymphadenopathy She will obtain chest x-ray today We will schedule PFTs to obtain baseline  Oxygen saturation is 99% today

## 2020-07-11 NOTE — Addendum Note (Signed)
Addended by: Lia Foyer R on: 07/11/2020 09:57 AM   Modules accepted: Orders

## 2020-07-11 NOTE — Patient Instructions (Signed)
You have sarcoidosis and obstructive sleep apnea  CXR today Schedule PFts   Prescription will be sent to DME for his auto CPAP 10 to 18 cm, medium full facemask Try to use this at least 6 hours every night This will give you better rest

## 2020-07-20 ENCOUNTER — Other Ambulatory Visit: Payer: Self-pay | Admitting: Family Medicine

## 2020-07-20 DIAGNOSIS — I10 Essential (primary) hypertension: Secondary | ICD-10-CM

## 2020-07-26 ENCOUNTER — Telehealth: Payer: Self-pay | Admitting: Pulmonary Disease

## 2020-07-26 NOTE — Telephone Encounter (Signed)
Spoke to [place] she said they had tried to contact the patient I have called and left a message for the patient to call  [phone]

## 2020-07-27 NOTE — Telephone Encounter (Signed)
Spoke to the patient she is aware and has spoken to [employer]

## 2020-07-28 DIAGNOSIS — E1129 Type 2 diabetes mellitus with other diabetic kidney complication: Secondary | ICD-10-CM | POA: Diagnosis not present

## 2020-07-28 DIAGNOSIS — I129 Hypertensive chronic kidney disease with stage 1 through stage 4 chronic kidney disease, or unspecified chronic kidney disease: Secondary | ICD-10-CM | POA: Diagnosis not present

## 2020-07-28 DIAGNOSIS — E1122 Type 2 diabetes mellitus with diabetic chronic kidney disease: Secondary | ICD-10-CM | POA: Diagnosis not present

## 2020-07-28 DIAGNOSIS — N189 Chronic kidney disease, unspecified: Secondary | ICD-10-CM | POA: Diagnosis not present

## 2020-07-28 DIAGNOSIS — R809 Proteinuria, unspecified: Secondary | ICD-10-CM | POA: Diagnosis not present

## 2020-07-28 DIAGNOSIS — R0602 Shortness of breath: Secondary | ICD-10-CM | POA: Diagnosis not present

## 2020-08-04 ENCOUNTER — Other Ambulatory Visit (HOSPITAL_COMMUNITY): Payer: Self-pay | Admitting: Family Medicine

## 2020-08-04 DIAGNOSIS — D638 Anemia in other chronic diseases classified elsewhere: Secondary | ICD-10-CM | POA: Diagnosis not present

## 2020-08-04 DIAGNOSIS — E1129 Type 2 diabetes mellitus with other diabetic kidney complication: Secondary | ICD-10-CM | POA: Diagnosis not present

## 2020-08-04 DIAGNOSIS — I129 Hypertensive chronic kidney disease with stage 1 through stage 4 chronic kidney disease, or unspecified chronic kidney disease: Secondary | ICD-10-CM | POA: Diagnosis not present

## 2020-08-04 DIAGNOSIS — N189 Chronic kidney disease, unspecified: Secondary | ICD-10-CM | POA: Diagnosis not present

## 2020-08-04 DIAGNOSIS — Z1231 Encounter for screening mammogram for malignant neoplasm of breast: Secondary | ICD-10-CM

## 2020-08-04 DIAGNOSIS — R809 Proteinuria, unspecified: Secondary | ICD-10-CM | POA: Diagnosis not present

## 2020-08-04 DIAGNOSIS — E1122 Type 2 diabetes mellitus with diabetic chronic kidney disease: Secondary | ICD-10-CM | POA: Diagnosis not present

## 2020-08-04 DIAGNOSIS — I517 Cardiomegaly: Secondary | ICD-10-CM | POA: Diagnosis not present

## 2020-08-05 DIAGNOSIS — Z9049 Acquired absence of other specified parts of digestive tract: Secondary | ICD-10-CM | POA: Diagnosis not present

## 2020-08-05 DIAGNOSIS — M5416 Radiculopathy, lumbar region: Secondary | ICD-10-CM | POA: Diagnosis not present

## 2020-08-05 DIAGNOSIS — G8929 Other chronic pain: Secondary | ICD-10-CM | POA: Diagnosis not present

## 2020-08-05 DIAGNOSIS — M47817 Spondylosis without myelopathy or radiculopathy, lumbosacral region: Secondary | ICD-10-CM | POA: Diagnosis not present

## 2020-08-05 DIAGNOSIS — M5441 Lumbago with sciatica, right side: Secondary | ICD-10-CM | POA: Diagnosis not present

## 2020-08-05 DIAGNOSIS — M5442 Lumbago with sciatica, left side: Secondary | ICD-10-CM | POA: Diagnosis not present

## 2020-08-09 DIAGNOSIS — S3140XA Unspecified open wound of vagina and vulva, initial encounter: Secondary | ICD-10-CM | POA: Diagnosis not present

## 2020-08-09 DIAGNOSIS — G4733 Obstructive sleep apnea (adult) (pediatric): Secondary | ICD-10-CM | POA: Diagnosis not present

## 2020-08-26 ENCOUNTER — Other Ambulatory Visit: Payer: Self-pay

## 2020-08-26 ENCOUNTER — Other Ambulatory Visit (HOSPITAL_COMMUNITY)
Admission: RE | Admit: 2020-08-26 | Discharge: 2020-08-26 | Disposition: A | Discharge status: Home / Self Care | Payer: PPO | Source: Ambulatory Visit | Attending: Pulmonary Disease | Admitting: Pulmonary Disease

## 2020-08-26 DIAGNOSIS — Z01812 Encounter for preprocedural laboratory examination: Secondary | ICD-10-CM | POA: Diagnosis not present

## 2020-08-26 DIAGNOSIS — Z20822 Contact with and (suspected) exposure to covid-19: Secondary | ICD-10-CM | POA: Diagnosis not present

## 2020-08-26 LAB — SARS CORONAVIRUS 2 (TAT 6-24 HRS): SARS Coronavirus 2: NEGATIVE

## 2020-08-30 ENCOUNTER — Other Ambulatory Visit: Payer: Self-pay

## 2020-08-30 ENCOUNTER — Ambulatory Visit (HOSPITAL_COMMUNITY)
Admission: RE | Admit: 2020-08-30 | Discharge: 2020-08-30 | Disposition: A | Discharge status: Home / Self Care | Payer: PPO | Source: Ambulatory Visit | Attending: Pulmonary Disease | Admitting: Pulmonary Disease

## 2020-08-30 DIAGNOSIS — D869 Sarcoidosis, unspecified: Secondary | ICD-10-CM | POA: Diagnosis not present

## 2020-08-30 DIAGNOSIS — R942 Abnormal results of pulmonary function studies: Secondary | ICD-10-CM | POA: Diagnosis not present

## 2020-08-30 LAB — PULMONARY FUNCTION TEST
DL/VA % pred: 90 %
DL/VA: 3.83 ml/min/mmHg/L
DLCO unc % pred: 69 %
DLCO unc: 14.66 ml/min/mmHg
FEF 25-75 Post: 2.73 L/sec
FEF 25-75 Pre: 2.79 L/sec
FEF2575-%Change-Post: -2 %
FEF2575-%Pred-Post: 120 %
FEF2575-%Pred-Pre: 123 %
FEV1-%Change-Post: -1 %
FEV1-%Pred-Post: 98 %
FEV1-%Pred-Pre: 99 %
FEV1-Post: 2.22 L
FEV1-Pre: 2.25 L
FEV1FVC-%Change-Post: 0 %
FEV1FVC-%Pred-Pre: 108 %
FEV6-%Change-Post: -2 %
FEV6-%Pred-Post: 91 %
FEV6-%Pred-Pre: 94 %
FEV6-Post: 2.53 L
FEV6-Pre: 2.6 L
FEV6FVC-%Pred-Post: 103 %
FEV6FVC-%Pred-Pre: 103 %
FVC-%Change-Post: 0 %
FVC-%Pred-Post: 90 %
FVC-%Pred-Pre: 91 %
FVC-Post: 2.58 L
FVC-Pre: 2.6 L
Post FEV1/FVC ratio: 86 %
Post FEV6/FVC ratio: 100 %
Pre FEV1/FVC ratio: 87 %
Pre FEV6/FVC Ratio: 100 %
RV % pred: 127 %
RV: 2.53 L
TLC % pred: 98 %
TLC: 5.12 L

## 2020-08-30 MED ORDER — ALBUTEROL SULFATE (2.5 MG/3ML) 0.083% IN NEBU
2.5000 mg | INHALATION_SOLUTION | Freq: Once | RESPIRATORY_TRACT | Status: AC
  Administered 2020-08-30: 2.5 mg via RESPIRATORY_TRACT

## 2020-09-07 DIAGNOSIS — M48061 Spinal stenosis, lumbar region without neurogenic claudication: Secondary | ICD-10-CM | POA: Diagnosis not present

## 2020-09-07 NOTE — Progress Notes (Signed)
Patient returned phone call. Verified identity. Went over PFT results per Dr Elsworth Soho. All questions answered and patient expressed full understanding. Nothing further needed at this time.

## 2020-09-07 NOTE — Progress Notes (Signed)
Called and left message on voicemail to please return call. Contact number provided.

## 2020-09-08 DIAGNOSIS — S3140XA Unspecified open wound of vagina and vulva, initial encounter: Secondary | ICD-10-CM | POA: Diagnosis not present

## 2020-09-08 DIAGNOSIS — G4733 Obstructive sleep apnea (adult) (pediatric): Secondary | ICD-10-CM | POA: Diagnosis not present

## 2020-09-16 ENCOUNTER — Other Ambulatory Visit: Payer: Self-pay

## 2020-09-16 ENCOUNTER — Ambulatory Visit (HOSPITAL_COMMUNITY)
Admission: RE | Admit: 2020-09-16 | Discharge: 2020-09-16 | Disposition: A | Discharge status: Home / Self Care | Payer: PPO | Source: Ambulatory Visit | Attending: Family Medicine | Admitting: Family Medicine

## 2020-09-16 DIAGNOSIS — Z1231 Encounter for screening mammogram for malignant neoplasm of breast: Secondary | ICD-10-CM | POA: Insufficient documentation

## 2020-10-03 DIAGNOSIS — D860 Sarcoidosis of lung: Secondary | ICD-10-CM | POA: Diagnosis not present

## 2020-10-03 DIAGNOSIS — E7800 Pure hypercholesterolemia, unspecified: Secondary | ICD-10-CM | POA: Diagnosis not present

## 2020-10-03 DIAGNOSIS — I10 Essential (primary) hypertension: Secondary | ICD-10-CM | POA: Diagnosis not present

## 2020-10-03 DIAGNOSIS — E1140 Type 2 diabetes mellitus with diabetic neuropathy, unspecified: Secondary | ICD-10-CM | POA: Diagnosis not present

## 2020-10-03 DIAGNOSIS — E1165 Type 2 diabetes mellitus with hyperglycemia: Secondary | ICD-10-CM | POA: Diagnosis not present

## 2020-10-03 DIAGNOSIS — N189 Chronic kidney disease, unspecified: Secondary | ICD-10-CM | POA: Diagnosis not present

## 2020-10-06 ENCOUNTER — Ambulatory Visit: Payer: PPO | Attending: Internal Medicine

## 2020-10-06 DIAGNOSIS — Z23 Encounter for immunization: Secondary | ICD-10-CM

## 2020-10-06 NOTE — Progress Notes (Signed)
   Covid-19 Vaccination Clinic  Name:  Hannah Schultz    MRN: [id number] DOB: Aug 13, 1961  10/06/2020  Hannah Schultz was observed post Covid-19 immunization for 15 minutes without incident. She was provided with Vaccine Information Sheet and instruction to access the V-Safe system.   Hannah Schultz was instructed to call 911 with any severe reactions post vaccine: Marland Kitchen Difficulty breathing  . Swelling of face and throat  . A fast heartbeat  . A bad rash all over body  . Dizziness and weakness

## 2020-10-09 DIAGNOSIS — S3140XA Unspecified open wound of vagina and vulva, initial encounter: Secondary | ICD-10-CM | POA: Diagnosis not present

## 2020-10-09 DIAGNOSIS — G4733 Obstructive sleep apnea (adult) (pediatric): Secondary | ICD-10-CM | POA: Diagnosis not present

## 2020-11-07 DIAGNOSIS — N189 Chronic kidney disease, unspecified: Secondary | ICD-10-CM | POA: Diagnosis not present

## 2020-11-07 DIAGNOSIS — I129 Hypertensive chronic kidney disease with stage 1 through stage 4 chronic kidney disease, or unspecified chronic kidney disease: Secondary | ICD-10-CM | POA: Diagnosis not present

## 2020-11-07 DIAGNOSIS — E1122 Type 2 diabetes mellitus with diabetic chronic kidney disease: Secondary | ICD-10-CM | POA: Diagnosis not present

## 2020-11-07 DIAGNOSIS — I517 Cardiomegaly: Secondary | ICD-10-CM | POA: Diagnosis not present

## 2020-11-07 DIAGNOSIS — E1129 Type 2 diabetes mellitus with other diabetic kidney complication: Secondary | ICD-10-CM | POA: Diagnosis not present

## 2020-11-07 DIAGNOSIS — R809 Proteinuria, unspecified: Secondary | ICD-10-CM | POA: Diagnosis not present

## 2020-11-07 DIAGNOSIS — D638 Anemia in other chronic diseases classified elsewhere: Secondary | ICD-10-CM | POA: Diagnosis not present

## 2020-11-08 DIAGNOSIS — S3140XA Unspecified open wound of vagina and vulva, initial encounter: Secondary | ICD-10-CM | POA: Diagnosis not present

## 2020-11-08 DIAGNOSIS — G4733 Obstructive sleep apnea (adult) (pediatric): Secondary | ICD-10-CM | POA: Diagnosis not present

## 2020-11-10 DIAGNOSIS — N189 Chronic kidney disease, unspecified: Secondary | ICD-10-CM | POA: Diagnosis not present

## 2020-11-10 DIAGNOSIS — R809 Proteinuria, unspecified: Secondary | ICD-10-CM | POA: Diagnosis not present

## 2020-11-10 DIAGNOSIS — G4733 Obstructive sleep apnea (adult) (pediatric): Secondary | ICD-10-CM | POA: Diagnosis not present

## 2020-11-10 DIAGNOSIS — F4320 Adjustment disorder, unspecified: Secondary | ICD-10-CM | POA: Diagnosis not present

## 2020-11-10 DIAGNOSIS — E1129 Type 2 diabetes mellitus with other diabetic kidney complication: Secondary | ICD-10-CM | POA: Diagnosis not present

## 2020-11-10 DIAGNOSIS — D638 Anemia in other chronic diseases classified elsewhere: Secondary | ICD-10-CM | POA: Diagnosis not present

## 2020-11-10 DIAGNOSIS — I129 Hypertensive chronic kidney disease with stage 1 through stage 4 chronic kidney disease, or unspecified chronic kidney disease: Secondary | ICD-10-CM | POA: Diagnosis not present

## 2020-11-10 DIAGNOSIS — E1122 Type 2 diabetes mellitus with diabetic chronic kidney disease: Secondary | ICD-10-CM | POA: Diagnosis not present

## 2020-12-09 DIAGNOSIS — G4733 Obstructive sleep apnea (adult) (pediatric): Secondary | ICD-10-CM | POA: Diagnosis not present

## 2020-12-09 DIAGNOSIS — S3140XA Unspecified open wound of vagina and vulva, initial encounter: Secondary | ICD-10-CM | POA: Diagnosis not present

## 2020-12-21 ENCOUNTER — Observation Stay (HOSPITAL_COMMUNITY)
Admission: EM | Admit: 2020-12-21 | Discharge: 2020-12-23 | Disposition: A | Discharge status: Home / Self Care | Payer: PPO | Attending: Family Medicine | Admitting: Family Medicine

## 2020-12-21 ENCOUNTER — Emergency Department (HOSPITAL_COMMUNITY): Payer: PPO

## 2020-12-21 ENCOUNTER — Encounter (HOSPITAL_COMMUNITY): Payer: Self-pay

## 2020-12-21 ENCOUNTER — Other Ambulatory Visit: Payer: Self-pay

## 2020-12-21 DIAGNOSIS — M5441 Lumbago with sciatica, right side: Secondary | ICD-10-CM | POA: Diagnosis not present

## 2020-12-21 DIAGNOSIS — E162 Hypoglycemia, unspecified: Secondary | ICD-10-CM

## 2020-12-21 DIAGNOSIS — R0789 Other chest pain: Secondary | ICD-10-CM | POA: Diagnosis not present

## 2020-12-21 DIAGNOSIS — R7989 Other specified abnormal findings of blood chemistry: Secondary | ICD-10-CM

## 2020-12-21 DIAGNOSIS — R0609 Other forms of dyspnea: Secondary | ICD-10-CM

## 2020-12-21 DIAGNOSIS — N184 Chronic kidney disease, stage 4 (severe): Secondary | ICD-10-CM | POA: Insufficient documentation

## 2020-12-21 DIAGNOSIS — Z794 Long term (current) use of insulin: Secondary | ICD-10-CM | POA: Insufficient documentation

## 2020-12-21 DIAGNOSIS — E66813 Obesity, class 3: Secondary | ICD-10-CM

## 2020-12-21 DIAGNOSIS — K219 Gastro-esophageal reflux disease without esophagitis: Secondary | ICD-10-CM

## 2020-12-21 DIAGNOSIS — E1122 Type 2 diabetes mellitus with diabetic chronic kidney disease: Secondary | ICD-10-CM | POA: Insufficient documentation

## 2020-12-21 DIAGNOSIS — N179 Acute kidney failure, unspecified: Secondary | ICD-10-CM | POA: Diagnosis not present

## 2020-12-21 DIAGNOSIS — M5442 Lumbago with sciatica, left side: Secondary | ICD-10-CM | POA: Insufficient documentation

## 2020-12-21 DIAGNOSIS — M7989 Other specified soft tissue disorders: Secondary | ICD-10-CM | POA: Diagnosis not present

## 2020-12-21 DIAGNOSIS — R079 Chest pain, unspecified: Secondary | ICD-10-CM | POA: Diagnosis not present

## 2020-12-21 DIAGNOSIS — I129 Hypertensive chronic kidney disease with stage 1 through stage 4 chronic kidney disease, or unspecified chronic kidney disease: Secondary | ICD-10-CM | POA: Insufficient documentation

## 2020-12-21 DIAGNOSIS — G8929 Other chronic pain: Secondary | ICD-10-CM

## 2020-12-21 DIAGNOSIS — G4733 Obstructive sleep apnea (adult) (pediatric): Secondary | ICD-10-CM | POA: Diagnosis present

## 2020-12-21 DIAGNOSIS — Z79899 Other long term (current) drug therapy: Secondary | ICD-10-CM | POA: Insufficient documentation

## 2020-12-21 DIAGNOSIS — R0600 Dyspnea, unspecified: Secondary | ICD-10-CM | POA: Diagnosis not present

## 2020-12-21 DIAGNOSIS — E119 Type 2 diabetes mellitus without complications: Secondary | ICD-10-CM

## 2020-12-21 DIAGNOSIS — E785 Hyperlipidemia, unspecified: Secondary | ICD-10-CM | POA: Diagnosis not present

## 2020-12-21 DIAGNOSIS — Z20822 Contact with and (suspected) exposure to covid-19: Secondary | ICD-10-CM | POA: Diagnosis not present

## 2020-12-21 DIAGNOSIS — I10 Essential (primary) hypertension: Secondary | ICD-10-CM | POA: Diagnosis present

## 2020-12-21 LAB — CBG MONITORING, ED: Glucose-Capillary: 67 mg/dL — ABNORMAL LOW (ref 70–99)

## 2020-12-21 LAB — CBC
HCT: 36.3 % (ref 36.0–46.0)
Hemoglobin: 11 g/dL — ABNORMAL LOW (ref 12.0–15.0)
MCH: 25.6 pg — ABNORMAL LOW (ref 26.0–34.0)
MCHC: 30.3 g/dL (ref 30.0–36.0)
MCV: 84.4 fL (ref 80.0–100.0)
Platelets: 226 10*3/uL (ref 150–400)
RBC: 4.3 MIL/uL (ref 3.87–5.11)
RDW: 15 % (ref 11.5–15.5)
WBC: 4 10*3/uL (ref 4.0–10.5)
nRBC: 0 % (ref 0.0–0.2)

## 2020-12-21 LAB — BASIC METABOLIC PANEL
Anion gap: 9 (ref 5–15)
BUN: 35 mg/dL — ABNORMAL HIGH (ref 6–20)
CO2: 26 mmol/L (ref 22–32)
Calcium: 9.6 mg/dL (ref 8.9–10.3)
Chloride: 104 mmol/L (ref 98–111)
Creatinine, Ser: 2.33 mg/dL — ABNORMAL HIGH (ref 0.44–1.00)
GFR, Estimated: 24 mL/min — ABNORMAL LOW (ref >60)
Glucose, Bld: 54 mg/dL — ABNORMAL LOW (ref 70–99)
Potassium: 3.8 mmol/L (ref 3.5–5.1)
Sodium: 139 mmol/L (ref 135–145)

## 2020-12-21 LAB — D-DIMER, QUANTITATIVE: D-Dimer, Quant: 2.12 ug/mL-FEU — ABNORMAL HIGH (ref 0.00–0.50)

## 2020-12-21 LAB — TROPONIN I (HIGH SENSITIVITY)
Troponin I (High Sensitivity): 4 ng/L (ref <18)
Troponin I (High Sensitivity): 4 ng/L (ref <18)

## 2020-12-21 NOTE — ED Notes (Addendum)
Pt provided with orange juice d/t low BGL. Provider will be made aware.

## 2020-12-21 NOTE — ED Provider Notes (Signed)
[facility] EMERGENCY DEPARTMENT Provider Note   CSN: [id number] Arrival date & time: 12/21/20  1696     History Chief Complaint  Patient presents with  . Chest Pain    Hannah Schultz is a 60 y.o. female.  HPI   This patient is a 60 year old female, she has a history of diabetes, cholesterol, hypertension, she does not smoke cigarettes.  She does have a significant family history of pulmonary embolism including a brother who died at the age of 75 from a blood clot.  She reports that over the last 4 days she has had an intermittent chest discomfort which seems to be in the middle of her chest and seems to sometimes radiate into her shoulder blades, at this time she is completely symptom-free.  She states that she has been getting short of breath and dyspneic on exertion, she does have chest discomfort on exertion but also has this in the morning when she wakes up at times.  There has been no nausea vomiting diarrhea or dysuria, no rashes to the skin, no fevers or chills.  She has not had any coughing.  Again at this time she is symptom-free.  She does not recall having exertional symptoms in the past but over the last several days has noticed this.  She does recall having a stress test many years ago but states she does not remember when.  She has not seen her family doctor or a heart doctor yet for the symptoms.  Past Medical History:  Diagnosis Date  . Acid reflux   . Arthritis   . Bulging lumbar disc   . Cataract   . Depression   . Diabetes mellitus   . Diabetic neuropathy ([facility])   . High cholesterol   . Hypertension   . Post traumatic stress disorder (PTSD)   . Sleep apnea     Patient Active Problem List   Diagnosis Date Noted  . OSA (obstructive sleep apnea) 07/11/2020  . Nausea and vomiting 12/21/2019  . Stage 3 chronic kidney disease (HCC) 12/21/2019  . Sarcoidosis 08/18/2019  . Vulvar abscess 02/07/2018  . Hyperlipidemia 09/19/2013  . Essential hypertension  09/19/2013  . Type II or unspecified type diabetes mellitus without mention of complication, uncontrolled 07/20/2013  . Trigger thumb of right hand 11/27/2012  . CTS (carpal tunnel syndrome) 11/27/2012    Past Surgical History:  Procedure Laterality Date  . ABDOMINAL HYSTERECTOMY    . CHOLECYSTECTOMY    . COLONOSCOPY    . EXTRACORPOREAL SHOCK WAVE LITHOTRIPSY Right 02/04/2020   Procedure: EXTRACORPOREAL SHOCK WAVE LITHOTRIPSY (ESWL);  Surgeon: Noel Christmas, MD;  Location: [facility];  Service: Urology;  Laterality: Right;  . INCISION AND DRAINAGE ABSCESS N/A 02/06/2018   Procedure: INCISION AND DRAINAGE vulvar ABSCESS;  Surgeon: Zelphia Cairo, MD;  Location: [facility];  Service: Gynecology;  Laterality: N/A;  . MEDIASTINOSCOPY N/A 04/25/2017   Procedure: MEDIASTINOSCOPY;  Surgeon: Loreli Slot, MD;  Location: [facility];  Service: Thoracic;  Laterality: N/A;  . TONSILLECTOMY  04/02/2012   Procedure: TONSILLECTOMY;  Surgeon: Darletta Moll, MD;  Location: [facility];  Service: ENT;  Laterality: Bilateral;     OB History    Gravida      Para      Term      Preterm      AB      Living  2     SAB      IAB  Ectopic      Multiple      Live Births              Family History  Problem Relation Age of Onset  . Heart disease Other   . Cancer Other   . Diabetes Other   . Stroke Mother   . Heart attack Mother   . Heart attack Father   . Leukemia Father   . Heart attack Sister   . Stroke Sister   . Kidney cancer Brother   . Aneurysm Brother   . Diabetes Brother   . Diabetes Brother     Social History   Tobacco Use  . Smoking status: Never Smoker  . Smokeless tobacco: Never Used  Vaping Use  . Vaping Use: Never used  Substance Use Topics  . Alcohol use: No  . Drug use: No    Home Medications Prior to Admission medications   Medication Sig Start Date End Date Taking? Authorizing Provider  Cholecalciferol (VITAMIN D3) 5000 UNITS TABS  Take 5,000 Units by mouth daily.     [provider]  famotidine (PEPCID) 20 MG tablet Take 20 mg by mouth 2 (two) times daily.    [provider]  FLUoxetine (PROZAC) 20 MG capsule Take 20 mg by mouth daily. 06/05/19   [provider]  gabapentin (NEURONTIN) 600 MG tablet Take 600 mg by mouth 3 (three) times daily. 03/08/20   [provider]  glucose blood (ONE TOUCH ULTRA TEST) test strip 3 times per day 12/29/13   Elayne Snare, MD  insulin regular (NOVOLIN R,HUMULIN R) 100 units/mL injection Inject 10-20 Units into the skin 3 (three) times daily before meals. Based on sliding scale    [provider]  lisinopril (ZESTRIL) 20 MG tablet Take 10 mg by mouth daily. 09/30/19 09/29/20  [provider]  meloxicam (MOBIC) 15 MG tablet Take 15 mg by mouth daily. 06/23/20   [provider]  metoprolol succinate (TOPROL-XL) 50 MG 24 hr tablet Take 1 tablet (50 mg total) by mouth daily. Take with or immediately following a meal. 01/13/20   Corum, Rex Kras, MD  NOVOLIN N RELION 100 UNIT/ML injection Inject 50-70 Units into the skin 3 (three) times daily before meals. 70 units breakfast and lunch, 50 units at bedtime 12/13/16   [provider]  ondansetron (ZOFRAN) 4 MG tablet Take 1 tablet (4 mg total) by mouth every 6 (six) hours. 02/01/20   Triplett, Tammy, PA-C  oxyCODONE-acetaminophen (PERCOCET/ROXICET) 5-325 MG tablet Take 2 tablets by mouth every 4 (four) hours as needed for severe pain. 02/01/20   Triplett, Tammy, PA-C  pantoprazole (PROTONIX) 40 MG tablet Take 40 mg by mouth daily. 04/18/20   [provider]  PROAIR HFA 108 (90 Base) MCG/ACT inhaler Inhale 1-2 puffs into the lungs every 6 (six) hours as needed for wheezing or shortness of breath.  10/30/17   [provider]  promethazine (PHENERGAN) 25 MG tablet Take 1 tablet (25 mg total) by mouth every 8 (eight) hours as needed for nausea or vomiting. 12/16/19   Corum, Rex Kras, MD   simvastatin (ZOCOR) 40 MG tablet Take 1 tablet (40 mg total) by mouth every evening. 09/22/13   Elayne Snare, MD  traMADol (ULTRAM) 50 MG tablet Take 50 mg by mouth every 6 (six) hours as needed. 05/27/20   [provider]    Allergies    Adhesive [tape]  Review of Systems   Review of Systems  All  other systems reviewed and are negative.   Physical Exam Updated Vital Signs BP 135/68   Pulse 63   Temp 98 F (36.7 C)   Resp 17   Ht 1.651 m ($Remove'5\' 5"'andYhDP$ )   Wt 131.5 kg   SpO2 98%   BMI 48.26 kg/m   Physical Exam Vitals and nursing note reviewed.  Constitutional:      General: She is not in acute distress.    Appearance: She is well-developed and well-nourished.  HENT:     Head: Normocephalic and atraumatic.     Mouth/Throat:     Mouth: Oropharynx is clear and moist.     Pharynx: No oropharyngeal exudate.  Eyes:     General: No scleral icterus.       Right eye: No discharge.        Left eye: No discharge.     Extraocular Movements: EOM normal.     Conjunctiva/sclera: Conjunctivae normal.     Pupils: Pupils are equal, round, and reactive to light.  Neck:     Thyroid: No thyromegaly.     Vascular: No JVD.  Cardiovascular:     Rate and Rhythm: Normal rate and regular rhythm.     Pulses: Intact distal pulses.     Heart sounds: Normal heart sounds. No murmur heard. No friction rub. No gallop.   Pulmonary:     Effort: Pulmonary effort is normal. No respiratory distress.     Breath sounds: Normal breath sounds. No wheezing or rales.  Chest:     Chest wall: No edema.  Abdominal:     General: Bowel sounds are normal. There is no distension.     Palpations: Abdomen is soft. There is no mass.     Tenderness: There is no abdominal tenderness.  Musculoskeletal:        General: No tenderness. Normal range of motion.     Cervical back: Normal range of motion and neck supple.     Right lower leg: Edema present.     Left lower leg: Edema present.  Lymphadenopathy:      Cervical: No cervical adenopathy.  Skin:    General: Skin is warm and dry.     Findings: No erythema or rash.  Neurological:     Mental Status: She is alert.     Coordination: Coordination normal.  Psychiatric:        Mood and Affect: Mood and affect normal.        Behavior: Behavior normal.     ED Results / Procedures / Treatments   Labs (all labs ordered are listed, but only abnormal results are displayed) Labs Reviewed  BASIC METABOLIC PANEL - Abnormal; Notable for the following components:      Result Value   Glucose, Bld 54 (*)    BUN 35 (*)    Creatinine, Ser 2.33 (*)    GFR, Estimated 24 (*)    All other components within normal limits  CBC - Abnormal; Notable for the following components:   Hemoglobin 11.0 (*)    MCH 25.6 (*)    All other components within normal limits  TROPONIN I (HIGH SENSITIVITY)    EKG EKG Interpretation  Date/Time:  Wednesday December 21 2020 19:08:23 EST Ventricular Rate:  62 PR Interval:  160 QRS Duration: 80 QT Interval:  418 QTC Calculation: 424 R Axis:   2 Text Interpretation: Normal sinus rhythm Normal ECG since last tracing no significant change Confirmed by Noemi Chapel [phone]) on 12/21/2020 7:18:33 PM  Radiology DG Chest 2 View  Result Date: 12/21/2020 CLINICAL DATA:  Chest pain. EXAM: CHEST - 2 VIEW COMPARISON:  08/11/2020 FINDINGS: The cardiomediastinal contours are normal. The lungs are clear. Pulmonary vasculature is normal. No consolidation, pleural effusion, or pneumothorax. No acute osseous abnormalities are seen. IMPRESSION: No acute chest findings. Electronically Signed   By: Keith Rake M.D.   On: 12/21/2020 19:33    Procedures Procedures (including critical care time)  Medications Ordered in ED Medications - No data to display  ED Course  I have reviewed the triage vital signs and the nursing notes.  Pertinent labs & imaging results that were available during my care of the patient were reviewed by me  and considered in my medical decision making (see chart for details).    MDM Rules/Calculators/A&P                          The patient's vital signs reflect no significant hypertension tachycardia hypoxia or tachypnea.  She is afebrile.  Of note her exam is rather unremarkable except for some what appears to be symmetrical edema of the bilateral lower extremities.  At this time the patient has no symptoms whatsoever, this reflects her normal EKG and chest x-ray as well as her initial troponin which was undetectable.  Her creatinine is 2.33 which is up from 2.17 last year this time, she has a normal CBC.  She needs a repeat troponin and a CT angiogram.  The patient is agreeable  At the change of shift with the care was signed out to Dr. Betsey Holiday who will follow-up results and disposition accordingly.  D-dimer is pending as she cannot have a CT angiogram, 2nd troponin pending  Final Clinical Impression(s) / ED Diagnoses Final diagnoses:  None    Rx / DC Orders ED Discharge Orders    None       Noemi Chapel, MD 12/21/20 2328

## 2020-12-21 NOTE — ED Notes (Signed)
Pt provided with crackers and shasta sprite. Pt tolerating well. Will recheck BGL shortly.

## 2020-12-21 NOTE — ED Triage Notes (Signed)
POV from home. Chest pains on and off since Sunday. States it has been getting a little worse. Mid chest, dull pain

## 2020-12-22 ENCOUNTER — Observation Stay (HOSPITAL_BASED_OUTPATIENT_CLINIC_OR_DEPARTMENT_OTHER): Payer: PPO

## 2020-12-22 ENCOUNTER — Observation Stay (HOSPITAL_COMMUNITY): Payer: PPO

## 2020-12-22 ENCOUNTER — Other Ambulatory Visit (HOSPITAL_COMMUNITY): Payer: Self-pay | Admitting: *Deleted

## 2020-12-22 ENCOUNTER — Other Ambulatory Visit: Payer: Self-pay

## 2020-12-22 DIAGNOSIS — R7989 Other specified abnormal findings of blood chemistry: Secondary | ICD-10-CM

## 2020-12-22 DIAGNOSIS — G8929 Other chronic pain: Secondary | ICD-10-CM

## 2020-12-22 DIAGNOSIS — N179 Acute kidney failure, unspecified: Secondary | ICD-10-CM | POA: Diagnosis not present

## 2020-12-22 DIAGNOSIS — G4733 Obstructive sleep apnea (adult) (pediatric): Secondary | ICD-10-CM | POA: Diagnosis not present

## 2020-12-22 DIAGNOSIS — R600 Localized edema: Secondary | ICD-10-CM | POA: Diagnosis not present

## 2020-12-22 DIAGNOSIS — E162 Hypoglycemia, unspecified: Secondary | ICD-10-CM

## 2020-12-22 DIAGNOSIS — I10 Essential (primary) hypertension: Secondary | ICD-10-CM

## 2020-12-22 DIAGNOSIS — M5441 Lumbago with sciatica, right side: Secondary | ICD-10-CM

## 2020-12-22 DIAGNOSIS — R0600 Dyspnea, unspecified: Secondary | ICD-10-CM

## 2020-12-22 DIAGNOSIS — E782 Mixed hyperlipidemia: Secondary | ICD-10-CM

## 2020-12-22 DIAGNOSIS — K219 Gastro-esophageal reflux disease without esophagitis: Secondary | ICD-10-CM | POA: Diagnosis not present

## 2020-12-22 DIAGNOSIS — E119 Type 2 diabetes mellitus without complications: Secondary | ICD-10-CM | POA: Diagnosis not present

## 2020-12-22 DIAGNOSIS — M7989 Other specified soft tissue disorders: Secondary | ICD-10-CM | POA: Diagnosis not present

## 2020-12-22 DIAGNOSIS — E66813 Obesity, class 3: Secondary | ICD-10-CM

## 2020-12-22 DIAGNOSIS — R079 Chest pain, unspecified: Secondary | ICD-10-CM

## 2020-12-22 DIAGNOSIS — N189 Chronic kidney disease, unspecified: Secondary | ICD-10-CM

## 2020-12-22 DIAGNOSIS — R0602 Shortness of breath: Secondary | ICD-10-CM | POA: Diagnosis not present

## 2020-12-22 DIAGNOSIS — M5442 Lumbago with sciatica, left side: Secondary | ICD-10-CM

## 2020-12-22 DIAGNOSIS — R0789 Other chest pain: Secondary | ICD-10-CM

## 2020-12-22 LAB — ECHOCARDIOGRAM COMPLETE
AR max vel: 2.65 cm2
AV Area VTI: 3.74 cm2
AV Area mean vel: 2.84 cm2
AV Mean grad: 3.4 mmHg
AV Peak grad: 6.5 mmHg
Ao pk vel: 1.28 m/s
Area-P 1/2: 2.85 cm2
Height: 65 in
S' Lateral: 1.6 cm
Weight: 4640 oz

## 2020-12-22 LAB — COMPREHENSIVE METABOLIC PANEL
ALT: 17 U/L (ref 0–44)
AST: 22 U/L (ref 15–41)
Albumin: 3.4 g/dL — ABNORMAL LOW (ref 3.5–5.0)
Alkaline Phosphatase: 59 U/L (ref 38–126)
Anion gap: 8 (ref 5–15)
BUN: 35 mg/dL — ABNORMAL HIGH (ref 6–20)
CO2: 24 mmol/L (ref 22–32)
Calcium: 9.4 mg/dL (ref 8.9–10.3)
Chloride: 106 mmol/L (ref 98–111)
Creatinine, Ser: 2.06 mg/dL — ABNORMAL HIGH (ref 0.44–1.00)
GFR, Estimated: 28 mL/min — ABNORMAL LOW (ref >60)
Glucose, Bld: 96 mg/dL (ref 70–99)
Potassium: 3.9 mmol/L (ref 3.5–5.1)
Sodium: 138 mmol/L (ref 135–145)
Total Bilirubin: 0.4 mg/dL (ref 0.3–1.2)
Total Protein: 6.9 g/dL (ref 6.5–8.1)

## 2020-12-22 LAB — CBC
HCT: 33.1 % — ABNORMAL LOW (ref 36.0–46.0)
Hemoglobin: 10.4 g/dL — ABNORMAL LOW (ref 12.0–15.0)
MCH: 26.2 pg (ref 26.0–34.0)
MCHC: 31.4 g/dL (ref 30.0–36.0)
MCV: 83.4 fL (ref 80.0–100.0)
Platelets: 182 10*3/uL (ref 150–400)
RBC: 3.97 MIL/uL (ref 3.87–5.11)
RDW: 15.1 % (ref 11.5–15.5)
WBC: 3.7 10*3/uL — ABNORMAL LOW (ref 4.0–10.5)
nRBC: 0 % (ref 0.0–0.2)

## 2020-12-22 LAB — PROTIME-INR
INR: 1.1 (ref 0.8–1.2)
Prothrombin Time: 14.1 seconds (ref 11.4–15.2)

## 2020-12-22 LAB — GLUCOSE, CAPILLARY
Glucose-Capillary: 135 mg/dL — ABNORMAL HIGH (ref 70–99)
Glucose-Capillary: 113 mg/dL — ABNORMAL HIGH (ref 70–99)

## 2020-12-22 LAB — MAGNESIUM: Magnesium: 1.7 mg/dL (ref 1.7–2.4)

## 2020-12-22 LAB — CBG MONITORING, ED
Glucose-Capillary: 105 mg/dL — ABNORMAL HIGH (ref 70–99)
Glucose-Capillary: 64 mg/dL — ABNORMAL LOW (ref 70–99)
Glucose-Capillary: 106 mg/dL — ABNORMAL HIGH (ref 70–99)
Glucose-Capillary: 107 mg/dL — ABNORMAL HIGH (ref 70–99)

## 2020-12-22 LAB — PHOSPHORUS: Phosphorus: 4.3 mg/dL (ref 2.5–4.6)

## 2020-12-22 LAB — APTT: aPTT: 51 seconds — ABNORMAL HIGH (ref 24–36)

## 2020-12-22 LAB — HEMOGLOBIN A1C
Hgb A1c MFr Bld: 7 % — ABNORMAL HIGH (ref 4.8–5.6)
Mean Plasma Glucose: 154.2 mg/dL

## 2020-12-22 LAB — HIV ANTIBODY (ROUTINE TESTING W REFLEX): HIV Screen 4th Generation wRfx: NONREACTIVE

## 2020-12-22 LAB — SARS CORONAVIRUS 2 (TAT 6-24 HRS): SARS Coronavirus 2: NEGATIVE

## 2020-12-22 MED ORDER — TRAMADOL HCL 50 MG PO TABS: 50.0000 mg | ORAL_TABLET | Freq: Four times a day (QID) | ORAL | Status: DC | PRN

## 2020-12-22 MED ORDER — TECHNETIUM TO 99M ALBUMIN AGGREGATED
4.1500 | Freq: Once | INTRAVENOUS | Status: AC | PRN
  Administered 2020-12-22: 4.15 via INTRAVENOUS

## 2020-12-22 MED ORDER — PANTOPRAZOLE SODIUM 40 MG PO TBEC: 40.0000 mg | DELAYED_RELEASE_TABLET | Freq: Every day | ORAL | Status: DC

## 2020-12-22 MED ORDER — ENOXAPARIN SODIUM 150 MG/ML ~~LOC~~ SOLN
130.0000 mg | Freq: Two times a day (BID) | SUBCUTANEOUS | Status: DC
  Administered 2020-12-22 – 2020-12-23 (×2): 130 mg via SUBCUTANEOUS
  Filled 2020-12-22 (×2): qty 1

## 2020-12-22 MED ORDER — SODIUM CHLORIDE 0.9 % IV SOLN: Freq: Once | INTRAVENOUS | Status: AC

## 2020-12-22 MED ORDER — SIMVASTATIN 20 MG PO TABS
40.0000 mg | ORAL_TABLET | Freq: Every evening | ORAL | Status: DC
  Administered 2020-12-22: 40 mg via ORAL
  Filled 2020-12-22: qty 2

## 2020-12-22 MED ORDER — ENOXAPARIN SODIUM 150 MG/ML ~~LOC~~ SOLN
130.0000 mg | Freq: Once | SUBCUTANEOUS | Status: AC
  Administered 2020-12-22: 130 mg via SUBCUTANEOUS
  Filled 2020-12-22: qty 1

## 2020-12-22 MED ORDER — INSULIN ASPART 100 UNIT/ML ~~LOC~~ SOLN: 0.0000 [IU] | Freq: Every day | SUBCUTANEOUS | Status: DC

## 2020-12-22 MED ORDER — PANTOPRAZOLE SODIUM 40 MG PO TBEC
40.0000 mg | DELAYED_RELEASE_TABLET | Freq: Two times a day (BID) | ORAL | Status: DC
  Administered 2020-12-22 – 2020-12-23 (×3): 40 mg via ORAL
  Filled 2020-12-22 (×3): qty 1

## 2020-12-22 MED ORDER — INSULIN ASPART 100 UNIT/ML ~~LOC~~ SOLN
0.0000 [IU] | Freq: Three times a day (TID) | SUBCUTANEOUS | Status: DC
  Administered 2020-12-22 – 2020-12-23 (×3): 1 [IU] via SUBCUTANEOUS

## 2020-12-22 NOTE — H&P (Signed)
History and Physical  Hannah Schultz KQD:[id number] DOB: [birth year] DOA: 12/21/2020  Referring physician: Jaci Carrel, MD PCP: Mattie Marlin, DO  Patient coming from: Home  Chief Complaint: Chest pain  HPI: Hannah Schultz is a 60 y.o. female with medical history significant for type II DM, hypertension, hyperlipidemia, GERD, OSA on CPAP, chronic right-sided low back pain with bilateral sciatica who presents to the emergency department due to chest pain which started on Sunday (1/9), pain was midsternal, it was dull in nature with some sharp components, it was rated as 6/10 on pain scale (maximal) and 3/10 (minimal), pain was consistent and radiated to the shoulder blades, this was associated with shortness of breath on exertion.  Chest pain was described as reproducible and some nonreproducible component.  Tylenol used did not alleviate symptoms.  She denies prior similar episodes.  Patient also complained of left leg swelling which started about 1 week ago.  She endorsed history of blood clots, massive heart attack and stroke in both parents in their 6s, she has a brother who died at the age of 44 from blood clot in the leg that radiated to the lungs.  She denies fever, chills, abdominal pain, nausea or vomiting.  ED Course:  In the emergency department, she was hemodynamically stable.  Work-up in the ED showed normal CBC, BUN/Cr 35/2.33 (baseline creatinine 1.7-1.8) and hypoglycemia.  D-dimer 2.12.  Troponin x 2 = 4. Chest x-ray showed no acute chest findings Therapeutic Lovenox was given due to elevated D-dimer.  Hospitalist was asked to admit patient for further evaluation and management.   Review of Systems: Constitutional: Negative for chills and fever.  HENT: Negative for ear pain and sore throat.   Eyes: Negative for pain and visual disturbance.  Respiratory: Positive for shortness of breath.  Negative for cough   Cardiovascular: Positive for chest pain and negative for  palpitations.  Gastrointestinal: Negative for abdominal pain and vomiting.  Endocrine: Negative for polyphagia and polyuria.  Genitourinary: Negative for decreased urine volume, dysuria, enuresis Musculoskeletal: Positive for left leg swelling.  Negative for arthralgias  Skin: Negative for color change and rash.  Allergic/Immunologic: Negative for immunocompromised state.  Neurological: Negative for tremors, syncope, speech difficulty, weakness, light-headedness and headaches.  Hematological: Does not bruise/bleed easily.  All other systems reviewed and are negative  Past Medical History:  Diagnosis Date  . Acid reflux   . Arthritis   . Bulging lumbar disc   . Cataract   . Depression   . Diabetes mellitus   . Diabetic neuropathy (HCC)   . High cholesterol   . Hypertension   . Post traumatic stress disorder (PTSD)   . Sleep apnea    Past Surgical History:  Procedure Laterality Date  . ABDOMINAL HYSTERECTOMY    . CHOLECYSTECTOMY    . COLONOSCOPY    . EXTRACORPOREAL SHOCK WAVE LITHOTRIPSY Right 02/04/2020   Procedure: EXTRACORPOREAL SHOCK WAVE LITHOTRIPSY (ESWL);  Surgeon: Noel Christmas, MD;  Location: [facility];  Service: Urology;  Laterality: Right;  . INCISION AND DRAINAGE ABSCESS N/A 02/06/2018   Procedure: INCISION AND DRAINAGE vulvar ABSCESS;  Surgeon: Zelphia Cairo, MD;  Location: [facility];  Service: Gynecology;  Laterality: N/A;  . MEDIASTINOSCOPY N/A 04/25/2017   Procedure: MEDIASTINOSCOPY;  Surgeon: Loreli Slot, MD;  Location: [facility] OR;  Service: Thoracic;  Laterality: N/A;  . TONSILLECTOMY  04/02/2012   Procedure: TONSILLECTOMY;  Surgeon: Darletta Moll, MD;  Location: [facility] OR;  Service: ENT;  Laterality: Bilateral;    Social History:  reports that she has never smoked. She has never used smokeless tobacco. She reports that she does not drink alcohol and does not use drugs.   Allergies  Allergen Reactions  . Adhesive [Tape] Other (See Comments)     Adhesive tape "tears skin"    Family History  Problem Relation Age of Onset  . Heart disease Other   . Cancer Other   . Diabetes Other   . Stroke Mother   . Heart attack Mother   . Heart attack Father   . Leukemia Father   . Heart attack Sister   . Stroke Sister   . Kidney cancer Brother   . Aneurysm Brother   . Diabetes Brother   . Diabetes Brother      Prior to Admission medications   Medication Sig Start Date End Date Taking? Authorizing Provider  Cholecalciferol (VITAMIN D3) 5000 UNITS TABS Take 5,000 Units by mouth daily.     [provider]  famotidine (PEPCID) 20 MG tablet Take 20 mg by mouth 2 (two) times daily.    [provider]  FLUoxetine (PROZAC) 20 MG capsule Take 20 mg by mouth daily. 06/05/19   [provider]  gabapentin (NEURONTIN) 600 MG tablet Take 600 mg by mouth 3 (three) times daily. 03/08/20   [provider]  glucose blood (ONE TOUCH ULTRA TEST) test strip 3 times per day 12/29/13   Reather Littler, MD  insulin regular (NOVOLIN R,HUMULIN R) 100 units/mL injection Inject 10-20 Units into the skin 3 (three) times daily before meals. Based on sliding scale    [provider]  lisinopril (ZESTRIL) 20 MG tablet Take 10 mg by mouth daily. 09/30/19 09/29/20  [provider]  meloxicam (MOBIC) 15 MG tablet Take 15 mg by mouth daily. 06/23/20   [provider]  metoprolol succinate (TOPROL-XL) 50 MG 24 hr tablet Take 1 tablet (50 mg total) by mouth daily. Take with or immediately following a meal. 01/13/20   Corum, Minerva Fester, MD  NOVOLIN N RELION 100 UNIT/ML injection Inject 50-70 Units into the skin 3 (three) times daily before meals. 70 units breakfast and lunch, 50 units at bedtime 12/13/16   [provider]  ondansetron (ZOFRAN) 4 MG tablet Take 1 tablet (4 mg total) by mouth every 6 (six) hours. 02/01/20   Triplett, Tammy, PA-C  oxyCODONE-acetaminophen (PERCOCET/ROXICET) 5-325 MG tablet Take 2 tablets by  mouth every 4 (four) hours as needed for severe pain. 02/01/20   Triplett, Tammy, PA-C  pantoprazole (PROTONIX) 40 MG tablet Take 40 mg by mouth daily. 04/18/20   [provider]  PROAIR HFA 108 (90 Base) MCG/ACT inhaler Inhale 1-2 puffs into the lungs every 6 (six) hours as needed for wheezing or shortness of breath.  10/30/17   [provider]  promethazine (PHENERGAN) 25 MG tablet Take 1 tablet (25 mg total) by mouth every 8 (eight) hours as needed for nausea or vomiting. 12/16/19   Corum, Minerva Fester, MD  simvastatin (ZOCOR) 40 MG tablet Take 1 tablet (40 mg total) by mouth every evening. 09/22/13   Reather Littler, MD  traMADol (ULTRAM) 50 MG tablet Take 50 mg by mouth every 6 (six) hours as needed. 05/27/20   [provider]    Physical Exam: BP 115/63 (BP Location: Left Arm)   Pulse (!) 49   Temp 98 F (36.7 C)   Resp 16   Ht 5\' 5"  (1.651 m)  Wt 131.5 kg   SpO2 97%   BMI 48.26 kg/m   . General: 60 y.o. year-old female well developed well nourished in no acute distress.  Alert and oriented x3. Marland Kitchen HEENT: NCAT, EOMI . Neck: Supple, trachea medial . Cardiovascular: Regular rate and rhythm with no rubs or gallops.  No thyromegaly or JVD noted.  2/4 pulses in all 4 extremities. Marland Kitchen Respiratory: Clear to auscultation with no wheezes or rales. Good inspiratory effort. . Abdomen: Soft nontender nondistended with normal bowel sounds x4 quadrants. . Muskuloskeletal: LLE swelling > RLE. No cyanosis or clubbing noted bilaterally . Neuro: CN II-XII intact, strength, sensation, reflexes intact . Skin: No ulcerative lesions noted or rashes . Psychiatry: Judgement and insight appear normal. Mood is appropriate for condition and setting          Labs on Admission:  Basic Metabolic Panel: Recent Labs  Lab 12/21/20 2028  NA 139  K 3.8  CL 104  CO2 26  GLUCOSE 54*  BUN 35*  CREATININE 2.33*  CALCIUM 9.6   Liver Function Tests: No results for input(s): AST, ALT, ALKPHOS,  BILITOT, PROT, ALBUMIN in the last 168 hours. No results for input(s): LIPASE, AMYLASE in the last 168 hours. No results for input(s): AMMONIA in the last 168 hours. CBC: Recent Labs  Lab 12/21/20 2028  WBC 4.0  HGB 11.0*  HCT 36.3  MCV 84.4  PLT 226   Cardiac Enzymes: No results for input(s): CKTOTAL, CKMB, CKMBINDEX, TROPONINI in the last 168 hours.  BNP (last 3 results) No results for input(s): BNP in the last 8760 hours.  ProBNP (last 3 results) No results for input(s): PROBNP in the last 8760 hours.  CBG: Recent Labs  Lab 12/21/20 2353 12/22/20 0112  GLUCAP 67* 105*    Radiological Exams on Admission: DG Chest 2 View  Result Date: 12/21/2020 CLINICAL DATA:  Chest pain. EXAM: CHEST - 2 VIEW COMPARISON:  08/11/2020 FINDINGS: The cardiomediastinal contours are normal. The lungs are clear. Pulmonary vasculature is normal. No consolidation, pleural effusion, or pneumothorax. No acute osseous abnormalities are seen. IMPRESSION: No acute chest findings. Electronically Signed   By: Narda Rutherford M.D.   On: 12/21/2020 19:33    EKG: I independently viewed the EKG done and my findings are as followed: Normal sinus rhythm at a rate of 62 bpm  Assessment/Plan Present on Admission: . Hyperlipidemia . Essential hypertension . OSA (obstructive sleep apnea)  Active Problems:   Type 2 diabetes mellitus without complication (HCC)   Hyperlipidemia   Essential hypertension   OSA (obstructive sleep apnea)   Atypical chest pain   Elevated d-dimer   Acute kidney injury superimposed on CKD (HCC)   Hypoglycemia   GERD (gastroesophageal reflux disease)   Obesity, Class III, BMI 40-49.9 (morbid obesity) (HCC)   Chronic low back pain with bilateral sciatica   Swelling of left lower extremity  Atypical chest pain Patient's chest pain was reproducible, though she complained of nonreproducible component Troponin x2 was flat at 4 Continue telemetry  EKG showed normal sinus rhythm  at a rate of 62 bpm  Consider cardiology consult for worsening nonreproducible chest pain  Elevated D-dimer R/O PE D-dimer 2.12; VQ scan will be done in the morning since CT angiogram of the chest cannot be done due to patient's worsening kidney status Therapeutic Lovenox was given with plan to transition to DOAC depending on V/Q scan findings  LLE swelling R/O DVT Lower extremity DVT will be done in the morning  Acute kidney injury on CKD stage IV BUN/Cr 35/2.33 (baseline creatinine 1.7-1.8) Continue gentle hydration Renally adjust medications, avoid nephrotoxic agents/dehydration/hypotension  Hypoglycemia-resolved  Type II DM Continue ISS and hypoglycemic protocol  Essential hypertension Hold BP meds at this time due to soft BP  Hyperlipidemia Continue Zocor  GERD Continue Protonix  Chronic low back pain with bilateral sciatica Continue tramadol  Obstructive sleep apnea on CPAP Continue CPAP  Obesity (BMI 48.26) Patient will be counseled on diet and lifestyle modification when more stable   DVT prophylaxis: Lovenox  Code Status: Full code  Family Communication: None at bedside  Disposition Plan:  Patient is from:                        home Anticipated DC to:                    home Anticipated DC date:              1 day Anticipated DC barriers:          Patient is not stable to be discharged at this time due to elevated D-dimer requiring further work-up to rule out PE  Consults called: None  Admission status: Observation  Bernadette Hoit MD [facility]  12/22/2020, 3:36 AM

## 2020-12-22 NOTE — ED Notes (Signed)
Pt ambulated to bathroom without difficulty. Gait steady. No c/o chest pain, SOB, dizziness or weakness. Pt appeared to be in NAD.

## 2020-12-22 NOTE — Progress Notes (Signed)
Patient seen and examined. Admitted after midnight with complaints for atypical chest pain and leg swelling. Patient is hemodynamically stable and currently CP free. D-dimer elevated but unable to have CTA due to CKD with Cr > 2 and Cr clearance in the 40's. Patient has been started empirically on treatment for thromboembolism and will check V/Q scan and LE dopplers. Please refer to H7P written by Dr. Josephine Cables on 12/22/20 for further info/details on admission.   Plan: -follow V/Q scan and LE dopplers -continue anticoagulation therapy for now -Follow 2-D echo -continue supportive care, PRN analgesics and follow clinical response.   Barton Dubois MD [phone]

## 2020-12-22 NOTE — ED Notes (Signed)
pts blood sugar 64. Breakfast provided along with a orange juice. Instructed pt to drink the orange juice first.

## 2020-12-22 NOTE — Progress Notes (Signed)
ANTICOAGULATION CONSULT NOTE - Initial Consult  Pharmacy Consult for Lovenox x 1  Indication: Rule out PE, awaiting VQ this AM  Allergies  Allergen Reactions  . Adhesive [Tape] Other (See Comments)    Adhesive tape "tears skin"    Patient Measurements: Height: 5\' 5"  (165.1 cm) Weight: 131.5 kg (290 lb) IBW/kg (Calculated) : 57  Vital Signs: Temp: 98 F (36.7 C) (01/12 1911) BP: 113/63 (01/13 0130) Pulse Rate: 51 (01/13 0130)  Labs: Recent Labs    12/21/20 2028 12/21/20 2300  HGB 11.0*  --   HCT 36.3  --   PLT 226  --   CREATININE 2.33*  --   TROPONINIHS 4 4    Estimated Creatinine Clearance: 36.5 mL/min (A) (by C-G formula based on SCr of 2.33 mg/dL (H)).   Medical History: Past Medical History:  Diagnosis Date  . Acid reflux   . Arthritis   . Bulging lumbar disc   . Cataract   . Depression   . Diabetes mellitus   . Diabetic neuropathy ([place])   . High cholesterol   . Hypertension   . Post traumatic stress disorder (PTSD)   . Sleep apnea    Assessment: Consulted to provide one dose of Lovenox for rule out PE while awaiting VQ scan this AM, D-dimer 2.12, Hgb 11, SCr 2.33  Goal of Therapy:  Monitor platelets by anticoagulation protocol: Yes   Plan:  -Lovenox 130 mg subcutaneous x 1  -F/U VQ scan results to determine further anti-coagulation plan  Narda Bonds, PharmD, [place] Pharmacist Phone: [phone]

## 2020-12-22 NOTE — Progress Notes (Signed)
*  PRELIMINARY RESULTS* Echocardiogram 2D Echocardiogram has been performed with Definity.  [name] 12/22/2020, 10:50 AM

## 2020-12-22 NOTE — ED Provider Notes (Signed)
Signed out to me by Dr. Sabra Heck to follow-up on residual testing. Patient's troponins are negative. Exertional pain and dyspnea, however, are concerning for possible cardiac etiology in this patient with multiple cardiac risk factors. Additionally, she has a strong family history of blood clot disorder. She is not on anticoagulation. D-dimer is elevated but her creatinine prevents CT angiography. Will give dose of Lovenox, will be admitted by hospitalist service for monitoring. VQ scan in the morning.   Orpah Greek, MD 12/22/20 0127

## 2020-12-22 NOTE — ED Notes (Signed)
Pt provided with more saltine crackers. No other requests at this time. Call bell within reach, bed in low position. Will continue to monitor.

## 2020-12-22 NOTE — Care Management Obs Status (Signed)
[place] NOTIFICATION   Patient Details  Name: Hannah Schultz MRN: [id number] Date of Birth: 1961-08-04   Medicare Observation Status Notification Given:  Yes    Boneta Lucks, RN 12/22/2020, 12:55 PM

## 2020-12-23 DIAGNOSIS — R7989 Other specified abnormal findings of blood chemistry: Secondary | ICD-10-CM | POA: Diagnosis not present

## 2020-12-23 DIAGNOSIS — K219 Gastro-esophageal reflux disease without esophagitis: Secondary | ICD-10-CM | POA: Diagnosis not present

## 2020-12-23 DIAGNOSIS — R0789 Other chest pain: Secondary | ICD-10-CM | POA: Diagnosis not present

## 2020-12-23 DIAGNOSIS — R0600 Dyspnea, unspecified: Secondary | ICD-10-CM | POA: Diagnosis not present

## 2020-12-23 LAB — GLUCOSE, CAPILLARY
Glucose-Capillary: 134 mg/dL — ABNORMAL HIGH (ref 70–99)
Glucose-Capillary: 147 mg/dL — ABNORMAL HIGH (ref 70–99)

## 2020-12-23 LAB — LIPID PANEL
Cholesterol: 174 mg/dL (ref 0–200)
HDL: 34 mg/dL — ABNORMAL LOW (ref >40)
LDL Cholesterol: 99 mg/dL (ref 0–99)
Total CHOL/HDL Ratio: 5.1 RATIO
Triglycerides: 207 mg/dL — ABNORMAL HIGH (ref <150)
VLDL: 41 mg/dL — ABNORMAL HIGH (ref 0–40)

## 2020-12-23 MED ORDER — PANTOPRAZOLE SODIUM 40 MG PO TBEC: 40.0000 mg | DELAYED_RELEASE_TABLET | Freq: Every day | ORAL | 3 refills | Status: DC

## 2020-12-23 MED ORDER — METOPROLOL SUCCINATE ER 50 MG PO TB24: 50.0000 mg | ORAL_TABLET | Freq: Every day | ORAL | 11 refills | Status: DC

## 2020-12-23 MED ORDER — SIMVASTATIN 40 MG PO TABS: 40.0000 mg | ORAL_TABLET | Freq: Every evening | ORAL | 5 refills | Status: DC

## 2020-12-23 MED ORDER — ENOXAPARIN SODIUM 80 MG/0.8ML ~~LOC~~ SOLN: 65.0000 mg | SUBCUTANEOUS | Status: DC

## 2020-12-23 MED ORDER — ASPIRIN EC 81 MG PO TBEC: 81.0000 mg | DELAYED_RELEASE_TABLET | Freq: Every day | ORAL | 2 refills | Status: AC

## 2020-12-23 MED ORDER — FLUOXETINE HCL 20 MG PO CAPS: 20.0000 mg | ORAL_CAPSULE | Freq: Every day | ORAL | 3 refills | Status: DC

## 2020-12-23 MED ORDER — PROAIR HFA 108 (90 BASE) MCG/ACT IN AERS: 1.0000 | INHALATION_SPRAY | Freq: Four times a day (QID) | RESPIRATORY_TRACT | 4 refills | Status: DC | PRN

## 2020-12-23 NOTE — Progress Notes (Signed)
Patient Saturations on Room Air at Rest =100%  Patient Saturations on [employer] while Ambulating = 99%

## 2020-12-23 NOTE — Progress Notes (Signed)
Patient refused CPAP at this time and RT placed [place] in patient room if needed. Patient was made aware that a unit was available if and when she changed her mind.

## 2020-12-23 NOTE — Discharge Instructions (Signed)
-  1)Avoid ibuprofen/Advil/Aleve/Motrin/Goody Powders/Naproxen/BC powders/Meloxicam/Diclofenac/Indomethacin and other Nonsteroidal anti-inflammatory medications as these will make you more likely to bleed and can cause stomach ulcers, can also cause Kidney problems.   2) please follow-up to primary care physician within a week for recheck and reevaluation, you may need to repeat BMP blood test  Your testing  has not shown any specific abnormal findings other than some kidney dysfunction which seems to be getting worse over time.  It is not that much worse than it was last time it was checked a year ago but you should have your family doctor follow this.  If you should develop worsening chest pain or shortness of breath please return to the emergency department immediately.  The heart doctors will need to see you in the office.  I have given you the phone number above, please call 1st thing in the morning to make the next available appointment.  Start taking an aspirin a day and continue this until you follow-up in the office

## 2020-12-23 NOTE — Discharge Summary (Signed)
Hannah Schultz, is a 60 y.o. female  DOB 1961/01/20  MRN [id number].  Admission date:  12/21/2020  Admitting Physician  Bernadette Hoit, DO  Discharge Date:  12/23/2020   Primary MD  Percell Belt, DO  Recommendations for primary care physician for things to follow:   1)Avoid ibuprofen/Advil/Aleve/Motrin/Goody Powders/Naproxen/BC powders/Meloxicam/Diclofenac/Indomethacin and other Nonsteroidal anti-inflammatory medications as these will make you more likely to bleed and can cause stomach ulcers, can also cause Kidney problems.   2) please follow-up to primary care physician within a week for recheck and reevaluation, you may need to repeat BMP blood test  Admission Diagnosis  Dyspnea on exertion [R06.00] Atypical chest pain [R07.89] Chest pain, mid sternal [R07.89] Left leg swelling [M79.89]   Discharge Diagnosis  Dyspnea on exertion [R06.00] Atypical chest pain [R07.89] Chest pain, mid sternal [R07.89] Left leg swelling [M79.89]    Active Problems:   Type 2 diabetes mellitus without complication (HCC)   Hyperlipidemia   Essential hypertension   OSA (obstructive sleep apnea)   Atypical chest pain   Elevated d-dimer   Acute kidney injury superimposed on CKD (HCC)   Hypoglycemia   GERD (gastroesophageal reflux disease)   Obesity, Class III, BMI 40-49.9 (morbid obesity) (HCC)   Chronic low back pain with bilateral sciatica   Swelling of left lower extremity      Past Medical History:  Diagnosis Date   Acid reflux    Arthritis    Bulging lumbar disc    Cataract    Depression    Diabetes mellitus    Diabetic neuropathy (HCC)    High cholesterol    Hypertension    Post traumatic stress disorder (PTSD)    Sleep apnea     Past Surgical History:  Procedure Laterality Date   ABDOMINAL HYSTERECTOMY     CHOLECYSTECTOMY     COLONOSCOPY     EXTRACORPOREAL SHOCK WAVE  LITHOTRIPSY Right 02/04/2020   Procedure: EXTRACORPOREAL SHOCK WAVE LITHOTRIPSY (ESWL);  Surgeon: Robley Fries, MD;  Location: [facility];  Service: Urology;  Laterality: Right;   INCISION AND DRAINAGE ABSCESS N/A 02/06/2018   Procedure: INCISION AND DRAINAGE vulvar ABSCESS;  Surgeon: Marylynn Pearson, MD;  Location: [facility];  Service: Gynecology;  Laterality: N/A;   MEDIASTINOSCOPY N/A 04/25/2017   Procedure: MEDIASTINOSCOPY;  Surgeon: Melrose Nakayama, MD;  Location: [facility];  Service: Thoracic;  Laterality: N/A;   TONSILLECTOMY  04/02/2012   Procedure: TONSILLECTOMY;  Surgeon: Ascencion Dike, MD;  Location: [facility];  Service: ENT;  Laterality: Bilateral;     HPI  from the history and physical done on the day of admission:    Chief Complaint: Chest pain  HPI: Hannah Schultz is a 60 y.o. female with medical history significant for type II DM, hypertension, hyperlipidemia, GERD, OSA on CPAP, chronic right-sided low back pain with bilateral sciatica who presents to the emergency department due to chest pain which started on Sunday (1/9), pain was midsternal, it was dull in nature with some sharp  components, it was rated as 6/10 on pain scale (maximal) and 3/10 (minimal), pain was consistent and radiated to the shoulder blades, this was associated with shortness of breath on exertion.  Chest pain was described as reproducible and some nonreproducible component.  Tylenol used did not alleviate symptoms.  She denies prior similar episodes.  Patient also complained of left leg swelling which started about 1 week ago.  She endorsed history of blood clots, massive heart attack and stroke in both parents in their 42s, she has a brother who died at the age of 75 from blood clot in the leg that radiated to the lungs.  She denies fever, chills, abdominal pain, nausea or vomiting.  ED Course:  In the emergency department, she was hemodynamically stable.  Work-up in the ED showed normal CBC,  BUN/Cr 35/2.33 (baseline creatinine 1.7-1.8) and hypoglycemia.  D-dimer 2.12.  Troponin x 2 = 4. Chest x-ray showed no acute chest findings Therapeutic Lovenox was given due to elevated D-dimer.  Hospitalist was asked to admit patient for further evaluation and management.     Hospital Course:     Atypical chest pain -Ruled out for ACS by serial troponins and EKG Lower extremity venous Dopplers and -VQ scan negative for DVT or PE -Chest x-ray without pneumonia  -AKI on CKD 4--creatinine improved to 2.0 from 2.3, repeat BMP with PCP advised, avoid NSAIDs  HTN--hold lisinopril due to AKI, continue metoprolol  DM2--- resume PTA insulin regimen  Obesity with OSA--continue CPAP nightly  HLD--continue simvastatin  GERD--continue Protonix  Discharge Condition: stable  Follow UP   Follow-up Information    [facility].   Specialty: Cardiology Why: Call in the morning for the next available appointment Contact information: [place]       [place].   Specialty: Emergency Medicine Why: If symptoms worsen Contact information: [address] [id number] mc [place] [address] [phone]       Lazoff, Shawn P, DO In 3 days.   Specialty: Family Medicine Contact information: [address] [place] [address] [phone]               Diet and Activity recommendation:  As advised  Discharge Instructions     Discharge Instructions    Call MD for:  difficulty breathing, headache or visual disturbances   Complete by: As directed    Call MD for:  persistant dizziness or light-headedness   Complete by: As directed    Call MD for:  persistant nausea and vomiting   Complete by: As directed    Call MD for:  severe uncontrolled pain   Complete by: As directed    Call MD for:  temperature >100.4   Complete by: As directed    Diet - low sodium heart  healthy   Complete by: As directed    Diet Carb Modified   Complete by: As directed    Discharge instructions   Complete by: As directed    1)Avoid ibuprofen/Advil/Aleve/Motrin/Goody Powders/Naproxen/BC powders/Meloxicam/Diclofenac/Indomethacin and other Nonsteroidal anti-inflammatory medications as these will make you more likely to bleed and can cause stomach ulcers, can also cause Kidney problems.   2) please follow-up to primary care physician within a week for recheck and reevaluation, you may need to repeat BMP blood test   Increase activity slowly   Complete by: As directed         Discharge Medications     Allergies  as of 12/23/2020      Reactions   Adhesive [tape] Other (See Comments)   Adhesive tape "tears skin"      Medication List    STOP taking these medications   lisinopril 20 MG tablet Commonly known as: ZESTRIL   meloxicam 15 MG tablet Commonly known as: MOBIC   ondansetron 4 MG tablet Commonly known as: ZOFRAN   oxyCODONE-acetaminophen 5-325 MG tablet Commonly known as: PERCOCET/ROXICET   promethazine 25 MG tablet Commonly known as: PHENERGAN   traMADol 50 MG tablet Commonly known as: ULTRAM     TAKE these medications   acetaminophen 500 MG tablet Commonly known as: TYLENOL Take 1,000 mg by mouth every 6 (six) hours as needed for moderate pain.   aspirin EC 81 MG tablet Take 1 tablet (81 mg total) by mouth daily with breakfast.   famotidine 20 MG tablet Commonly known as: PEPCID Take 20 mg by mouth 2 (two) times daily.   FLUoxetine 20 MG capsule Commonly known as: PROZAC Take 1 capsule (20 mg total) by mouth daily.   gabapentin 600 MG tablet Commonly known as: NEURONTIN Take 600 mg by mouth 3 (three) times daily.   glucose blood test strip Commonly known as: ONE TOUCH ULTRA TEST 3 times per day   insulin regular 100 units/mL injection Commonly known as: NOVOLIN R Inject 10-20 Units into the skin 3 (three) times daily before  meals. Based on sliding scale   metoprolol succinate 50 MG 24 hr tablet Commonly known as: Toprol XL Take 1 tablet (50 mg total) by mouth daily. Take with or immediately following a meal.   NovoLIN N ReliOn 100 UNIT/ML injection Generic drug: insulin NPH Human Inject 50-70 Units into the skin 3 (three) times daily before meals. 60units breakfast and40 units lunch, 10units at bedtime   pantoprazole 40 MG tablet Commonly known as: PROTONIX Take 1 tablet (40 mg total) by mouth daily.   ProAir HFA 108 (90 Base) MCG/ACT inhaler Generic drug: albuterol Inhale 1-2 puffs into the lungs every 6 (six) hours as needed for wheezing or shortness of breath.   simvastatin 40 MG tablet Commonly known as: ZOCOR Take 1 tablet (40 mg total) by mouth every evening.   Vitamin D3 125 MCG (5000 UT) Tabs Take 5,000 Units by mouth daily.       Major procedures and Radiology Reports - PLEASE review detailed and final reports for all details, in brief -   DG Chest 2 View  Result Date: 12/21/2020 CLINICAL DATA:  Chest pain. EXAM: CHEST - 2 VIEW COMPARISON:  08/11/2020 FINDINGS: The cardiomediastinal contours are normal. The lungs are clear. Pulmonary vasculature is normal. No consolidation, pleural effusion, or pneumothorax. No acute osseous abnormalities are seen. IMPRESSION: No acute chest findings. Electronically Signed   By: Keith Rake M.D.   On: 12/21/2020 19:33   NM Pulmonary Perf and Vent  Result Date: 12/22/2020 CLINICAL DATA:  Positive D-dimer. Short of breath and chest pain for 1 week. EXAM: NUCLEAR MEDICINE PERFUSION LUNG SCAN TECHNIQUE: Perfusion images were obtained in multiple projections after intravenous injection of radiopharmaceutical. Ventilation scans intentionally deferred if perfusion scan and chest x-ray adequate for interpretation during COVID 19 epidemic. RADIOPHARMACEUTICALS:  4.15 mCi Tc-51m MAA IV COMPARISON:  Chest radiograph dated 12/21/2020 FINDINGS: Perfusion to the  lungs is normal. There are no defects to suggest a pulmonary embolus. IMPRESSION: Normal perfusion study of the lungs. Electronically Signed   By: Lajean Manes M.D.   On: 12/22/2020 15:10  US Venous Img Lower Bilateral (DVT)  Result Date: 12/22/2020 CLINICAL DATA:  Lower extremity edema for 1 week. EXAM: BILATERAL LOWER EXTREMITY VENOUS DOPPLER ULTRASOUND TECHNIQUE: Gray-scale sonography with compression, as well as color and duplex ultrasound, were performed to evaluate the deep venous system(s) from the level of the common femoral vein through the popliteal and proximal calf veins. COMPARISON:  None. FINDINGS: VENOUS Normal compressibility of the common femoral, superficial femoral, and popliteal veins, as well as the visualized calf veins. Visualized portions of profunda femoral vein and great saphenous vein unremarkable. No filling defects to suggest DVT on grayscale or color Doppler imaging. Doppler waveforms show normal direction of venous flow, normal respiratory plasticity and response to augmentation. Limited views of the contralateral common femoral vein are unremarkable. OTHER None. Limitations: none IMPRESSION: Negative exam. Electronically Signed   By: Inge Rise M.D.   On: 12/22/2020 12:28   ECHOCARDIOGRAM COMPLETE  Result Date: 12/22/2020    ECHOCARDIOGRAM REPORT   Patient Name:   Hannah Schultz [facility] Date of Exam: 12/22/2020 Medical Rec #:  [id number]        Height:       65.0 in Accession #:    [id number]       Weight:       290.0 lb Date of Birth:  04-04-1961         BSA:          2.317 m Patient Age:    8 years         BP:           131/83 mmHg Patient Gender: F                HR:           51 bpm. Exam Location:  [facility] Procedure: 2D Echo, Cardiac Doppler and Color Doppler Indications:    Chest Pain R07.9  History:        Patient has prior history of Echocardiogram examinations, most                 recent 06/08/2020. Risk Factors:Hypertension, Diabetes and                  Dyslipidemia. Obesity, Class III, BMI 40-49.9 (morbid obesity),                 OSA (obstructive sleep apnea), Stage 3 chronic kidney disease,                 Swelling of left lower extremity.  Sonographer:    Alvino Chapel RCS Referring Phys: [place]  1. Left ventricular ejection fraction, by estimation, is 65 to 70%. The left ventricle has normal function. The left ventricle has no regional wall motion abnormalities. There is moderate left ventricular hypertrophy. Left ventricular diastolic parameters are consistent with Grade I diastolic dysfunction (impaired relaxation).  2. Right ventricular systolic function is normal. The right ventricular size is normal. There is severely elevated pulmonary artery systolic pressure.  3. The mitral valve is normal in structure. No evidence of mitral valve regurgitation. No evidence of mitral stenosis.  4. The aortic valve is tricuspid. There is mild calcification of the aortic valve. There is mild thickening of the aortic valve. Aortic valve regurgitation is not visualized. No aortic stenosis is present. FINDINGS  Left Ventricle: Left ventricular ejection fraction, by estimation, is 65 to 70%. The left ventricle has normal function. The left ventricle has no regional wall motion abnormalities.  Definity contrast agent was given IV to delineate the left ventricular  endocardial borders. The left ventricular internal cavity size was normal in size. There is moderate left ventricular hypertrophy. Left ventricular diastolic parameters are consistent with Grade I diastolic dysfunction (impaired relaxation). Normal left  ventricular filling pressure. Right Ventricle: The right ventricular size is normal. No increase in right ventricular wall thickness. Right ventricular systolic function is normal. There is severely elevated pulmonary artery systolic pressure. The tricuspid regurgitant velocity is 2.35 m/s, and with an assumed right atrial pressure of 51 mmHg,  the estimated right ventricular systolic pressure is 16.1 mmHg. Left Atrium: Left atrial size was normal in size. Right Atrium: Right atrial size was normal in size. Pericardium: There is no evidence of pericardial effusion. Mitral Valve: The mitral valve is normal in structure. No evidence of mitral valve regurgitation. No evidence of mitral valve stenosis. Tricuspid Valve: The tricuspid valve is normal in structure. Tricuspid valve regurgitation is not demonstrated. No evidence of tricuspid stenosis. Aortic Valve: The aortic valve is tricuspid. There is mild calcification of the aortic valve. There is mild thickening of the aortic valve. There is mild aortic valve annular calcification. Aortic valve regurgitation is not visualized. No aortic stenosis  is present. Aortic valve mean gradient measures 3.4 mmHg. Aortic valve peak gradient measures 6.5 mmHg. Aortic valve area, by VTI measures 3.74 cm. Pulmonic Valve: The pulmonic valve was not well visualized. Pulmonic valve regurgitation is not visualized. No evidence of pulmonic stenosis. Aorta: The aortic root is normal in size and structure. IAS/Shunts: The interatrial septum was not well visualized.  LEFT VENTRICLE PLAX 2D LVIDd:         3.70 cm  Diastology LVIDs:         1.60 cm  LV e' medial:    6.85 cm/s LV PW:         1.20 cm  LV E/e' medial:  12.7 LV IVS:        1.30 cm  LV e' lateral:   10.00 cm/s LVOT diam:     2.00 cm  LV E/e' lateral: 8.7 LV SV:         104 LV SV Index:   45 LVOT Area:     3.14 cm  RIGHT VENTRICLE RV S prime:     12.10 cm/s TAPSE (M-mode): 2.8 cm LEFT ATRIUM             Index       RIGHT ATRIUM           Index LA diam:        3.00 cm 1.30 cm/m  RA Area:     16.30 cm LA Vol (A2C):   26.5 ml 11.44 ml/m RA Volume:   42.50 ml  18.35 ml/m LA Vol (A4C):   65.5 ml 28.28 ml/m LA Biplane Vol: 42.0 ml 18.13 ml/m  AORTIC VALVE AV Area (Vmax):    2.65 cm AV Area (Vmean):   2.84 cm AV Area (VTI):     3.74 cm AV Vmax:           127.88 cm/s  AV Vmean:          85.764 cm/s AV VTI:            0.278 m AV Peak Grad:      6.5 mmHg AV Mean Grad:      3.4 mmHg LVOT Vmax:         108.00 cm/s LVOT Vmean:  77.400 cm/s LVOT VTI:          0.331 m LVOT/AV VTI ratio: 1.19  AORTA Ao Root diam: 3.10 cm MITRAL VALVE               TRICUSPID VALVE MV Area (PHT): 2.85 cm    TR Peak grad:   22.1 mmHg MV Decel Time: 266 msec    TR Vmax:        235.00 cm/s MV E velocity: 87.00 cm/s MV A velocity: 96.30 cm/s  SHUNTS MV E/A ratio:  0.90        Systemic VTI:  0.33 m                            Systemic Diam: 2.00 cm Carlyle Dolly MD Electronically signed by Carlyle Dolly MD Signature Date/Time: 12/22/2020/11:22:14 AM    Final     Micro Results    Recent Results (from the past 240 hour(s))  SARS CORONAVIRUS 2 (TAT 6-24 HRS) Nasopharyngeal Nasopharyngeal Swab     Status: None   Collection Time: 12/21/20 11:00 PM   Specimen: Nasopharyngeal Swab  Result Value Ref Range Status   SARS Coronavirus 2 NEGATIVE NEGATIVE Final    Comment: (NOTE) SARS-CoV-2 target nucleic acids are NOT DETECTED.  The SARS-CoV-2 RNA is generally detectable in upper and lower respiratory specimens during the acute phase of infection. Negative results do not preclude SARS-CoV-2 infection, do not rule out co-infections with other pathogens, and should not be used as the sole basis for treatment or other patient management decisions. Negative results must be combined with clinical observations, patient history, and epidemiological information. The expected result is Negative.  Fact Sheet for Patients: SugarRoll.be  Fact Sheet for Healthcare Providers: https://www.woods-mathews.com/  This test is not yet approved or cleared by the [place] FDA and  has been authorized for detection and/or diagnosis of SARS-CoV-2 by FDA under an Emergency Use Authorization (EUA). This EUA will remain  in effect (meaning this test can be used)  for the duration of the COVID-19 declaration under Se ction 564(b)(1) of the Act, 21 U.S.C. section 360bbb-3(b)(1), unless the authorization is terminated or revoked sooner.  Performed at [facility], [place] [address] [place], [place] [address]        Today   Subjective    Hannah Schultz today has no new complaints, -No chest pains no palpitations no dizziness, no dyspnea on exertion          Patient has been seen and examined prior to discharge   Objective   Blood pressure 140/83, pulse 77, temperature 97.7 F (36.5 C), temperature source Oral, resp. rate 20, height $RemoveBe'5\' 5"'fAPPfvwet$  (1.651 m), weight 131.5 kg, SpO2 100 %.   Intake/Output Summary (Last 24 hours) at 12/23/2020 1427 Last data filed at 12/22/2020 2208 Gross per 24 hour  Intake 1068.63 ml  Output --  Net 1068.63 ml    Exam Gen:- Awake Alert, no acute distress  HEENT:- [place].AT, No sclera icterus Neck-Supple Neck,No JVD,.  Lungs-  CTAB , good air movement bilaterally  CV- S1, S2 normal, regular Abd-  +ve B.Sounds, Abd Soft, No tenderness,    Extremity/Skin:- No  edema,   good pulses Psych-affect is appropriate, oriented x3 Neuro-no new focal deficits, no tremors    Data Review   CBC w Diff:  Lab Results  Component Value Date   WBC 3.7 (L) 12/22/2020   HGB 10.4 (L) 12/22/2020  HCT 33.1 (L) 12/22/2020   PLT 182 12/22/2020   LYMPHOPCT 21 02/01/2020   MONOPCT 10 02/01/2020   EOSPCT 2 02/01/2020   BASOPCT 1 02/01/2020    CMP:  Lab Results  Component Value Date   NA 138 12/22/2020   NA 134 (A) 07/31/2017   K 3.9 12/22/2020   CL 106 12/22/2020   CO2 24 12/22/2020   BUN 35 (H) 12/22/2020   BUN 21 07/30/2019   CREATININE 2.06 (H) 12/22/2020   CREATININE 1.82 (H) 12/21/2019   GLU 212 07/29/2019   PROT 6.9 12/22/2020   ALBUMIN 3.4 (L) 12/22/2020   BILITOT 0.4 12/22/2020   ALKPHOS 59 12/22/2020   AST 22 12/22/2020   ALT 17 12/22/2020  .   Total Discharge time is about 33 minutes  Roxan Hockey M.D on 12/23/2020 at 2:27 PM  Go to [email or web address] -  for contact info  [facility] - Office  [phone]

## 2020-12-28 DIAGNOSIS — D869 Sarcoidosis, unspecified: Secondary | ICD-10-CM | POA: Diagnosis not present

## 2020-12-28 DIAGNOSIS — I272 Pulmonary hypertension, unspecified: Secondary | ICD-10-CM | POA: Diagnosis not present

## 2020-12-28 DIAGNOSIS — I129 Hypertensive chronic kidney disease with stage 1 through stage 4 chronic kidney disease, or unspecified chronic kidney disease: Secondary | ICD-10-CM | POA: Diagnosis not present

## 2020-12-28 DIAGNOSIS — R079 Chest pain, unspecified: Secondary | ICD-10-CM | POA: Diagnosis not present

## 2020-12-28 DIAGNOSIS — D649 Anemia, unspecified: Secondary | ICD-10-CM | POA: Diagnosis not present

## 2020-12-28 DIAGNOSIS — D72819 Decreased white blood cell count, unspecified: Secondary | ICD-10-CM | POA: Diagnosis not present

## 2020-12-28 DIAGNOSIS — N183 Chronic kidney disease, stage 3 unspecified: Secondary | ICD-10-CM | POA: Diagnosis not present

## 2021-01-09 DIAGNOSIS — I129 Hypertensive chronic kidney disease with stage 1 through stage 4 chronic kidney disease, or unspecified chronic kidney disease: Secondary | ICD-10-CM | POA: Diagnosis not present

## 2021-01-09 DIAGNOSIS — N189 Chronic kidney disease, unspecified: Secondary | ICD-10-CM | POA: Diagnosis not present

## 2021-01-09 DIAGNOSIS — G4733 Obstructive sleep apnea (adult) (pediatric): Secondary | ICD-10-CM | POA: Diagnosis not present

## 2021-01-09 DIAGNOSIS — E1129 Type 2 diabetes mellitus with other diabetic kidney complication: Secondary | ICD-10-CM | POA: Diagnosis not present

## 2021-01-09 DIAGNOSIS — S3140XA Unspecified open wound of vagina and vulva, initial encounter: Secondary | ICD-10-CM | POA: Diagnosis not present

## 2021-01-09 DIAGNOSIS — R809 Proteinuria, unspecified: Secondary | ICD-10-CM | POA: Diagnosis not present

## 2021-01-09 DIAGNOSIS — D638 Anemia in other chronic diseases classified elsewhere: Secondary | ICD-10-CM | POA: Diagnosis not present

## 2021-01-09 DIAGNOSIS — E1122 Type 2 diabetes mellitus with diabetic chronic kidney disease: Secondary | ICD-10-CM | POA: Diagnosis not present

## 2021-01-11 DIAGNOSIS — R001 Bradycardia, unspecified: Secondary | ICD-10-CM | POA: Diagnosis not present

## 2021-01-11 DIAGNOSIS — R079 Chest pain, unspecified: Secondary | ICD-10-CM | POA: Diagnosis not present

## 2021-01-19 DIAGNOSIS — G4733 Obstructive sleep apnea (adult) (pediatric): Secondary | ICD-10-CM | POA: Diagnosis not present

## 2021-01-19 DIAGNOSIS — S3140XA Unspecified open wound of vagina and vulva, initial encounter: Secondary | ICD-10-CM | POA: Diagnosis not present

## 2021-01-20 DIAGNOSIS — R0602 Shortness of breath: Secondary | ICD-10-CM | POA: Diagnosis not present

## 2021-01-20 DIAGNOSIS — R079 Chest pain, unspecified: Secondary | ICD-10-CM | POA: Diagnosis not present

## 2021-01-20 DIAGNOSIS — R0600 Dyspnea, unspecified: Secondary | ICD-10-CM | POA: Diagnosis not present

## 2021-01-30 DIAGNOSIS — D638 Anemia in other chronic diseases classified elsewhere: Secondary | ICD-10-CM | POA: Diagnosis not present

## 2021-01-30 DIAGNOSIS — E1122 Type 2 diabetes mellitus with diabetic chronic kidney disease: Secondary | ICD-10-CM | POA: Diagnosis not present

## 2021-01-30 DIAGNOSIS — N189 Chronic kidney disease, unspecified: Secondary | ICD-10-CM | POA: Diagnosis not present

## 2021-01-30 DIAGNOSIS — R809 Proteinuria, unspecified: Secondary | ICD-10-CM | POA: Diagnosis not present

## 2021-01-30 DIAGNOSIS — E1129 Type 2 diabetes mellitus with other diabetic kidney complication: Secondary | ICD-10-CM | POA: Diagnosis not present

## 2021-01-30 DIAGNOSIS — I129 Hypertensive chronic kidney disease with stage 1 through stage 4 chronic kidney disease, or unspecified chronic kidney disease: Secondary | ICD-10-CM | POA: Diagnosis not present

## 2021-02-22 DIAGNOSIS — Z6841 Body Mass Index (BMI) 40.0 and over, adult: Secondary | ICD-10-CM | POA: Diagnosis not present

## 2021-02-22 DIAGNOSIS — L309 Dermatitis, unspecified: Secondary | ICD-10-CM | POA: Diagnosis not present

## 2021-02-22 DIAGNOSIS — Z124 Encounter for screening for malignant neoplasm of cervix: Secondary | ICD-10-CM | POA: Diagnosis not present

## 2021-02-23 DIAGNOSIS — R079 Chest pain, unspecified: Secondary | ICD-10-CM | POA: Diagnosis not present

## 2021-03-09 DIAGNOSIS — G4733 Obstructive sleep apnea (adult) (pediatric): Secondary | ICD-10-CM | POA: Diagnosis not present

## 2021-03-09 DIAGNOSIS — S3140XA Unspecified open wound of vagina and vulva, initial encounter: Secondary | ICD-10-CM | POA: Diagnosis not present

## 2021-03-23 DIAGNOSIS — R809 Proteinuria, unspecified: Secondary | ICD-10-CM | POA: Diagnosis not present

## 2021-03-23 DIAGNOSIS — I129 Hypertensive chronic kidney disease with stage 1 through stage 4 chronic kidney disease, or unspecified chronic kidney disease: Secondary | ICD-10-CM | POA: Diagnosis not present

## 2021-03-23 DIAGNOSIS — E1122 Type 2 diabetes mellitus with diabetic chronic kidney disease: Secondary | ICD-10-CM | POA: Diagnosis not present

## 2021-03-23 DIAGNOSIS — N189 Chronic kidney disease, unspecified: Secondary | ICD-10-CM | POA: Diagnosis not present

## 2021-03-23 DIAGNOSIS — D638 Anemia in other chronic diseases classified elsewhere: Secondary | ICD-10-CM | POA: Diagnosis not present

## 2021-03-23 DIAGNOSIS — E1129 Type 2 diabetes mellitus with other diabetic kidney complication: Secondary | ICD-10-CM | POA: Diagnosis not present

## 2021-03-31 DIAGNOSIS — G4733 Obstructive sleep apnea (adult) (pediatric): Secondary | ICD-10-CM | POA: Diagnosis not present

## 2021-04-06 DIAGNOSIS — D638 Anemia in other chronic diseases classified elsewhere: Secondary | ICD-10-CM | POA: Diagnosis not present

## 2021-04-06 DIAGNOSIS — E1129 Type 2 diabetes mellitus with other diabetic kidney complication: Secondary | ICD-10-CM | POA: Diagnosis not present

## 2021-04-06 DIAGNOSIS — N189 Chronic kidney disease, unspecified: Secondary | ICD-10-CM | POA: Diagnosis not present

## 2021-04-06 DIAGNOSIS — E1122 Type 2 diabetes mellitus with diabetic chronic kidney disease: Secondary | ICD-10-CM | POA: Diagnosis not present

## 2021-04-06 DIAGNOSIS — I129 Hypertensive chronic kidney disease with stage 1 through stage 4 chronic kidney disease, or unspecified chronic kidney disease: Secondary | ICD-10-CM | POA: Diagnosis not present

## 2021-04-06 DIAGNOSIS — R809 Proteinuria, unspecified: Secondary | ICD-10-CM | POA: Diagnosis not present

## 2021-04-06 DIAGNOSIS — R0989 Other specified symptoms and signs involving the circulatory and respiratory systems: Secondary | ICD-10-CM | POA: Diagnosis not present

## 2021-04-08 DIAGNOSIS — G4733 Obstructive sleep apnea (adult) (pediatric): Secondary | ICD-10-CM | POA: Diagnosis not present

## 2021-04-08 DIAGNOSIS — S3140XA Unspecified open wound of vagina and vulva, initial encounter: Secondary | ICD-10-CM | POA: Diagnosis not present

## 2021-04-25 DIAGNOSIS — N189 Chronic kidney disease, unspecified: Secondary | ICD-10-CM | POA: Diagnosis not present

## 2021-04-25 DIAGNOSIS — I10 Essential (primary) hypertension: Secondary | ICD-10-CM | POA: Diagnosis not present

## 2021-04-25 DIAGNOSIS — D860 Sarcoidosis of lung: Secondary | ICD-10-CM | POA: Diagnosis not present

## 2021-04-25 DIAGNOSIS — E1140 Type 2 diabetes mellitus with diabetic neuropathy, unspecified: Secondary | ICD-10-CM | POA: Diagnosis not present

## 2021-04-25 DIAGNOSIS — E1165 Type 2 diabetes mellitus with hyperglycemia: Secondary | ICD-10-CM | POA: Diagnosis not present

## 2021-04-25 DIAGNOSIS — E7800 Pure hypercholesterolemia, unspecified: Secondary | ICD-10-CM | POA: Diagnosis not present

## 2021-05-17 DIAGNOSIS — M47816 Spondylosis without myelopathy or radiculopathy, lumbar region: Secondary | ICD-10-CM | POA: Diagnosis not present

## 2021-05-17 DIAGNOSIS — M5116 Intervertebral disc disorders with radiculopathy, lumbar region: Secondary | ICD-10-CM | POA: Diagnosis not present

## 2021-05-19 ENCOUNTER — Other Ambulatory Visit: Payer: Self-pay | Admitting: Neurosurgery

## 2021-05-19 DIAGNOSIS — M5116 Intervertebral disc disorders with radiculopathy, lumbar region: Secondary | ICD-10-CM

## 2021-05-29 ENCOUNTER — Ambulatory Visit
Admission: RE | Admit: 2021-05-29 | Discharge: 2021-05-29 | Disposition: A | Discharge status: Home / Self Care | Payer: PPO | Source: Ambulatory Visit | Attending: Neurosurgery | Admitting: Neurosurgery

## 2021-05-29 ENCOUNTER — Other Ambulatory Visit: Payer: Self-pay

## 2021-05-29 DIAGNOSIS — M545 Low back pain, unspecified: Secondary | ICD-10-CM | POA: Diagnosis not present

## 2021-05-29 DIAGNOSIS — M48061 Spinal stenosis, lumbar region without neurogenic claudication: Secondary | ICD-10-CM | POA: Diagnosis not present

## 2021-05-29 DIAGNOSIS — M5116 Intervertebral disc disorders with radiculopathy, lumbar region: Secondary | ICD-10-CM

## 2021-05-30 DIAGNOSIS — D860 Sarcoidosis of lung: Secondary | ICD-10-CM | POA: Diagnosis not present

## 2021-05-30 DIAGNOSIS — F419 Anxiety disorder, unspecified: Secondary | ICD-10-CM | POA: Diagnosis not present

## 2021-05-30 DIAGNOSIS — I272 Pulmonary hypertension, unspecified: Secondary | ICD-10-CM | POA: Insufficient documentation

## 2021-05-30 DIAGNOSIS — D638 Anemia in other chronic diseases classified elsewhere: Secondary | ICD-10-CM | POA: Diagnosis not present

## 2021-05-30 DIAGNOSIS — E559 Vitamin D deficiency, unspecified: Secondary | ICD-10-CM | POA: Diagnosis not present

## 2021-05-30 DIAGNOSIS — Z0000 Encounter for general adult medical examination without abnormal findings: Secondary | ICD-10-CM | POA: Diagnosis not present

## 2021-05-30 DIAGNOSIS — F32A Depression, unspecified: Secondary | ICD-10-CM | POA: Diagnosis not present

## 2021-05-30 DIAGNOSIS — N1831 Chronic kidney disease, stage 3a: Secondary | ICD-10-CM | POA: Diagnosis not present

## 2021-05-30 DIAGNOSIS — E785 Hyperlipidemia, unspecified: Secondary | ICD-10-CM | POA: Diagnosis not present

## 2021-05-30 DIAGNOSIS — G4733 Obstructive sleep apnea (adult) (pediatric): Secondary | ICD-10-CM | POA: Diagnosis not present

## 2021-05-30 DIAGNOSIS — I129 Hypertensive chronic kidney disease with stage 1 through stage 4 chronic kidney disease, or unspecified chronic kidney disease: Secondary | ICD-10-CM | POA: Diagnosis not present

## 2021-05-30 DIAGNOSIS — E1142 Type 2 diabetes mellitus with diabetic polyneuropathy: Secondary | ICD-10-CM | POA: Diagnosis not present

## 2021-06-02 DIAGNOSIS — I129 Hypertensive chronic kidney disease with stage 1 through stage 4 chronic kidney disease, or unspecified chronic kidney disease: Secondary | ICD-10-CM | POA: Diagnosis not present

## 2021-06-02 DIAGNOSIS — E1122 Type 2 diabetes mellitus with diabetic chronic kidney disease: Secondary | ICD-10-CM | POA: Diagnosis not present

## 2021-06-02 DIAGNOSIS — R0989 Other specified symptoms and signs involving the circulatory and respiratory systems: Secondary | ICD-10-CM | POA: Diagnosis not present

## 2021-06-02 DIAGNOSIS — D638 Anemia in other chronic diseases classified elsewhere: Secondary | ICD-10-CM | POA: Diagnosis not present

## 2021-06-02 DIAGNOSIS — R809 Proteinuria, unspecified: Secondary | ICD-10-CM | POA: Diagnosis not present

## 2021-06-02 DIAGNOSIS — N189 Chronic kidney disease, unspecified: Secondary | ICD-10-CM | POA: Diagnosis not present

## 2021-06-02 DIAGNOSIS — E1129 Type 2 diabetes mellitus with other diabetic kidney complication: Secondary | ICD-10-CM | POA: Diagnosis not present

## 2021-06-07 DIAGNOSIS — M5116 Intervertebral disc disorders with radiculopathy, lumbar region: Secondary | ICD-10-CM | POA: Diagnosis not present

## 2021-06-07 DIAGNOSIS — M47816 Spondylosis without myelopathy or radiculopathy, lumbar region: Secondary | ICD-10-CM | POA: Diagnosis not present

## 2021-06-14 DIAGNOSIS — R809 Proteinuria, unspecified: Secondary | ICD-10-CM | POA: Diagnosis not present

## 2021-06-14 DIAGNOSIS — E1122 Type 2 diabetes mellitus with diabetic chronic kidney disease: Secondary | ICD-10-CM | POA: Diagnosis not present

## 2021-06-14 DIAGNOSIS — I129 Hypertensive chronic kidney disease with stage 1 through stage 4 chronic kidney disease, or unspecified chronic kidney disease: Secondary | ICD-10-CM | POA: Diagnosis not present

## 2021-06-14 DIAGNOSIS — E1129 Type 2 diabetes mellitus with other diabetic kidney complication: Secondary | ICD-10-CM | POA: Diagnosis not present

## 2021-06-14 DIAGNOSIS — N189 Chronic kidney disease, unspecified: Secondary | ICD-10-CM | POA: Diagnosis not present

## 2021-08-18 ENCOUNTER — Other Ambulatory Visit (HOSPITAL_COMMUNITY): Payer: Self-pay | Admitting: Family Medicine

## 2021-08-18 DIAGNOSIS — Z1231 Encounter for screening mammogram for malignant neoplasm of breast: Secondary | ICD-10-CM

## 2021-08-31 DIAGNOSIS — E785 Hyperlipidemia, unspecified: Secondary | ICD-10-CM | POA: Diagnosis not present

## 2021-09-06 ENCOUNTER — Encounter (HOSPITAL_COMMUNITY): Payer: Self-pay | Admitting: *Deleted

## 2021-09-06 ENCOUNTER — Emergency Department (HOSPITAL_COMMUNITY): Payer: PPO

## 2021-09-06 ENCOUNTER — Emergency Department (HOSPITAL_COMMUNITY)
Admission: EM | Admit: 2021-09-06 | Discharge: 2021-09-06 | Disposition: A | Discharge status: Home / Self Care | Payer: PPO | Attending: Emergency Medicine | Admitting: Emergency Medicine

## 2021-09-06 ENCOUNTER — Other Ambulatory Visit: Payer: Self-pay

## 2021-09-06 DIAGNOSIS — E1122 Type 2 diabetes mellitus with diabetic chronic kidney disease: Secondary | ICD-10-CM | POA: Diagnosis not present

## 2021-09-06 DIAGNOSIS — E119 Type 2 diabetes mellitus without complications: Secondary | ICD-10-CM | POA: Diagnosis not present

## 2021-09-06 DIAGNOSIS — Z7982 Long term (current) use of aspirin: Secondary | ICD-10-CM | POA: Diagnosis not present

## 2021-09-06 DIAGNOSIS — R809 Proteinuria, unspecified: Secondary | ICD-10-CM | POA: Diagnosis not present

## 2021-09-06 DIAGNOSIS — R0781 Pleurodynia: Secondary | ICD-10-CM | POA: Diagnosis not present

## 2021-09-06 DIAGNOSIS — I129 Hypertensive chronic kidney disease with stage 1 through stage 4 chronic kidney disease, or unspecified chronic kidney disease: Secondary | ICD-10-CM | POA: Diagnosis not present

## 2021-09-06 DIAGNOSIS — W19XXXA Unspecified fall, initial encounter: Secondary | ICD-10-CM

## 2021-09-06 DIAGNOSIS — S299XXA Unspecified injury of thorax, initial encounter: Secondary | ICD-10-CM | POA: Diagnosis present

## 2021-09-06 DIAGNOSIS — S20211A Contusion of right front wall of thorax, initial encounter: Secondary | ICD-10-CM | POA: Diagnosis not present

## 2021-09-06 DIAGNOSIS — Z794 Long term (current) use of insulin: Secondary | ICD-10-CM | POA: Insufficient documentation

## 2021-09-06 DIAGNOSIS — W07XXXA Fall from chair, initial encounter: Secondary | ICD-10-CM | POA: Insufficient documentation

## 2021-09-06 DIAGNOSIS — Z79899 Other long term (current) drug therapy: Secondary | ICD-10-CM | POA: Insufficient documentation

## 2021-09-06 DIAGNOSIS — E1129 Type 2 diabetes mellitus with other diabetic kidney complication: Secondary | ICD-10-CM | POA: Diagnosis not present

## 2021-09-06 DIAGNOSIS — I10 Essential (primary) hypertension: Secondary | ICD-10-CM | POA: Diagnosis not present

## 2021-09-06 DIAGNOSIS — N189 Chronic kidney disease, unspecified: Secondary | ICD-10-CM | POA: Diagnosis not present

## 2021-09-06 LAB — URINALYSIS, ROUTINE W REFLEX MICROSCOPIC
Bilirubin Urine: NEGATIVE
Glucose, UA: NEGATIVE mg/dL
Hgb urine dipstick: NEGATIVE
Ketones, ur: NEGATIVE mg/dL
Leukocytes,Ua: NEGATIVE
Nitrite: NEGATIVE
Protein, ur: 30 mg/dL — AB
Specific Gravity, Urine: 1.017 (ref 1.005–1.030)
pH: 5 (ref 5.0–8.0)

## 2021-09-06 MED ORDER — HYDROCODONE-ACETAMINOPHEN 5-325 MG PO TABS: 1.0000 | ORAL_TABLET | Freq: Four times a day (QID) | ORAL | 0 refills | Status: DC | PRN

## 2021-09-06 NOTE — Discharge Instructions (Addendum)
Please refer to the attached instructions. Follow-up with your primary care provider if no improvement over the next few days.

## 2021-09-06 NOTE — ED Triage Notes (Signed)
Pt c/o fall out of a chair on Saturday with pain to right side of back. Denies LOC or use of blood thinners. Denies hitting her head.

## 2021-09-06 NOTE — ED Provider Notes (Signed)
[facility] EMERGENCY DEPARTMENT Provider Note   CSN: [id number] Arrival date & time: 09/06/21  1843     History Chief Complaint  Patient presents with   Lytle Michaels    Hannah Schultz is a 60 y.o. female.  Patient states she was trying to sit down in a chair, lost her balance and fell on Saturday, landing on her right side. She is complaining of pain to the right lower ribs and flank area. Patient did not hit her head or lose consciousness. She denies shortness of breath and hematuria. No fevers or chills. No abdominal, hip, lower or upper extremity pain.  The history is provided by the patient. No language interpreter was used.  Fall This is a new problem. The current episode started more than 2 days ago. The problem has been gradually worsening. Pertinent negatives include no abdominal pain and no shortness of breath. The symptoms are aggravated by bending, twisting, walking and standing. She has tried acetaminophen for the symptoms. The treatment provided no relief.      Past Medical History:  Diagnosis Date   Acid reflux    Arthritis    Bulging lumbar disc    Cataract    Depression    Diabetes mellitus    Diabetic neuropathy (HCC)    High cholesterol    Hypertension    Post traumatic stress disorder (PTSD)    Sleep apnea     Patient Active Problem List   Diagnosis Date Noted   Atypical chest pain 12/22/2020   Elevated d-dimer 12/22/2020   Acute kidney injury superimposed on CKD ([place]) 12/22/2020   Hypoglycemia 12/22/2020   GERD (gastroesophageal reflux disease) 12/22/2020   Obesity, Class III, BMI 40-49.9 (morbid obesity) ([place]) 12/22/2020   Chronic low back pain with bilateral sciatica 12/22/2020   Swelling of left lower extremity 12/22/2020   OSA (obstructive sleep apnea) 07/11/2020   Nausea and vomiting 12/21/2019   Stage 3 chronic kidney disease ([place]) 12/21/2019   Sarcoidosis 08/18/2019   Vulvar abscess 02/07/2018   Hyperlipidemia 09/19/2013   Essential  hypertension 09/19/2013   Type 2 diabetes mellitus without complication ([place]) 84/69/6295   Trigger thumb of right hand 11/27/2012   CTS (carpal tunnel syndrome) 11/27/2012    Past Surgical History:  Procedure Laterality Date   ABDOMINAL HYSTERECTOMY     CHOLECYSTECTOMY     COLONOSCOPY     EXTRACORPOREAL SHOCK WAVE LITHOTRIPSY Right 02/04/2020   Procedure: EXTRACORPOREAL SHOCK WAVE LITHOTRIPSY (ESWL);  Surgeon: Robley Fries, MD;  Location: [facility];  Service: Urology;  Laterality: Right;   INCISION AND DRAINAGE ABSCESS N/A 02/06/2018   Procedure: INCISION AND DRAINAGE vulvar ABSCESS;  Surgeon: Marylynn Pearson, MD;  Location: [facility];  Service: Gynecology;  Laterality: N/A;   MEDIASTINOSCOPY N/A 04/25/2017   Procedure: MEDIASTINOSCOPY;  Surgeon: Melrose Nakayama, MD;  Location: [place];  Service: Thoracic;  Laterality: N/A;   TONSILLECTOMY  04/02/2012   Procedure: TONSILLECTOMY;  Surgeon: Ascencion Dike, MD;  Location: [facility];  Service: ENT;  Laterality: Bilateral;     OB History     Gravida      Para      Term      Preterm      AB      Living  2      SAB      IAB      Ectopic      Multiple      Live Births  Family History  Problem Relation Age of Onset   Heart disease Other    Cancer Other    Diabetes Other    Stroke Mother    Heart attack Mother    Heart attack Father    Leukemia Father    Heart attack Sister    Stroke Sister    Kidney cancer Brother    Aneurysm Brother    Diabetes Brother    Diabetes Brother     Social History   Tobacco Use   Smoking status: Never   Smokeless tobacco: Never  Vaping Use   Vaping Use: Never used  Substance Use Topics   Alcohol use: No   Drug use: No    Home Medications Prior to Admission medications   Medication Sig Start Date End Date Taking? Authorizing Provider  acetaminophen (TYLENOL) 500 MG tablet Take 1,000 mg by mouth every 6 (six) hours as needed for moderate pain.     [provider]  aspirin EC 81 MG tablet Take 1 tablet (81 mg total) by mouth daily with breakfast. 12/23/20 12/23/21  Roxan Hockey, MD  Cholecalciferol (VITAMIN D3) 5000 UNITS TABS Take 5,000 Units by mouth daily.     [provider]  famotidine (PEPCID) 20 MG tablet Take 20 mg by mouth 2 (two) times daily.    [provider]  FLUoxetine (PROZAC) 20 MG capsule Take 1 capsule (20 mg total) by mouth daily. 12/23/20   Roxan Hockey, MD  gabapentin (NEURONTIN) 600 MG tablet Take 600 mg by mouth 3 (three) times daily. 03/08/20   [provider]  glucose blood (ONE TOUCH ULTRA TEST) test strip 3 times per day 12/29/13   Elayne Snare, MD  insulin regular (NOVOLIN R,HUMULIN R) 100 units/mL injection Inject 10-20 Units into the skin 3 (three) times daily before meals. Based on sliding scale    [provider]  metoprolol succinate (TOPROL XL) 50 MG 24 hr tablet Take 1 tablet (50 mg total) by mouth daily. Take with or immediately following a meal. 12/23/20 12/23/21  Emokpae, Courage, MD  NOVOLIN N RELION 100 UNIT/ML injection Inject 50-70 Units into the skin 3 (three) times daily before meals. 60units breakfast and40 units lunch, 10units at bedtime 12/13/16   [provider]  pantoprazole (PROTONIX) 40 MG tablet Take 1 tablet (40 mg total) by mouth daily. 12/23/20   Roxan Hockey, MD  PROAIR HFA 108 (90 Base) MCG/ACT inhaler Inhale 1-2 puffs into the lungs every 6 (six) hours as needed for wheezing or shortness of breath. 12/23/20   Roxan Hockey, MD  simvastatin (ZOCOR) 40 MG tablet Take 1 tablet (40 mg total) by mouth every evening. 12/23/20   Roxan Hockey, MD    Allergies    Adhesive [tape]  Review of Systems   Review of Systems  Respiratory:  Negative for shortness of breath.   Gastrointestinal:  Negative for abdominal pain.  Genitourinary:  Positive for flank pain.  All other systems reviewed and are negative.  Physical Exam Updated  Vital Signs BP (!) 126/56 (BP Location: Right Arm)   Pulse 65   Temp 98 F (36.7 C) (Oral)   Resp 18   Ht $R'5\' 5"'Go$  (1.651 m)   Wt 132.5 kg   SpO2 98%   BMI 48.59 kg/m   Physical Exam Vitals and nursing note reviewed.  Constitutional:      Appearance: Normal appearance.  HENT:     Head: Normocephalic.     Nose: Nose normal.  Mouth/Throat:     Mouth: Mucous membranes are moist.  Eyes:     Extraocular Movements: Extraocular movements intact.  Cardiovascular:     Rate and Rhythm: Normal rate and regular rhythm.  Pulmonary:     Effort: Pulmonary effort is normal.     Breath sounds: Normal breath sounds.  Abdominal:     Palpations: Abdomen is soft.  Musculoskeletal:        General: Tenderness and signs of injury present. No deformity.     Cervical back: Normal range of motion and neck supple.  Skin:    General: Skin is warm and dry.  Neurological:     Mental Status: She is alert and oriented to person, place, and time.  Psychiatric:        Mood and Affect: Mood normal.        Behavior: Behavior normal.    ED Results / Procedures / Treatments   Labs (all labs ordered are listed, but only abnormal results are displayed) Labs Reviewed  URINALYSIS, ROUTINE W REFLEX MICROSCOPIC - Abnormal; Notable for the following components:      Result Value   Protein, ur 30 (*)    Bacteria, UA RARE (*)    All other components within normal limits    EKG None  Radiology DG Ribs Unilateral W/Chest Right  Result Date: 09/06/2021 CLINICAL DATA:  Status post fall, rib pain EXAM: RIGHT RIBS AND CHEST - 3+ VIEW COMPARISON:  None. FINDINGS: No fracture or other bone lesions are seen involving the ribs. There is no evidence of pneumothorax or pleural effusion. Both lungs are clear. Heart size and mediastinal contours are within normal limits. IMPRESSION: Negative. Electronically Signed   By: Kathreen Devoid M.D.   On: 09/06/2021 21:25    Procedures Procedures   Medications Ordered in  ED Medications - No data to display  ED Course  I have reviewed the triage vital signs and the nursing notes.  Pertinent labs & imaging results that were available during my care of the patient were reviewed by me and considered in my medical decision making (see chart for details).    MDM Rules/Calculators/A&P                           Labs and radiology results reviewed. No hematuria or rib fractures. Exam and findings consistent with soft tissue contusion. Care instructions provided and return precautions discussed. Follow-up with PCP if no improvement in the next several days. Final Clinical Impression(s) / ED Diagnoses Final diagnoses:  Fall, initial encounter  Contusion of right chest wall, initial encounter    Rx / DC Orders ED Discharge Orders          Ordered    HYDROcodone-acetaminophen (NORCO/VICODIN) 5-325 MG tablet  Every 6 hours PRN        09/06/21 2233             Etta Quill, NP 09/06/21 2341    Truddie Hidden, MD 09/08/21 [phone]

## 2021-09-18 ENCOUNTER — Ambulatory Visit (HOSPITAL_COMMUNITY)
Admission: RE | Admit: 2021-09-18 | Discharge: 2021-09-18 | Disposition: A | Discharge status: Home / Self Care | Payer: PPO | Source: Ambulatory Visit | Attending: Family Medicine | Admitting: Family Medicine

## 2021-09-18 ENCOUNTER — Other Ambulatory Visit: Payer: Self-pay

## 2021-09-18 DIAGNOSIS — Z1231 Encounter for screening mammogram for malignant neoplasm of breast: Secondary | ICD-10-CM | POA: Insufficient documentation

## 2021-10-03 DIAGNOSIS — Z23 Encounter for immunization: Secondary | ICD-10-CM | POA: Diagnosis not present

## 2021-10-04 DIAGNOSIS — D638 Anemia in other chronic diseases classified elsewhere: Secondary | ICD-10-CM | POA: Diagnosis not present

## 2021-10-04 DIAGNOSIS — I129 Hypertensive chronic kidney disease with stage 1 through stage 4 chronic kidney disease, or unspecified chronic kidney disease: Secondary | ICD-10-CM | POA: Diagnosis not present

## 2021-10-04 DIAGNOSIS — N189 Chronic kidney disease, unspecified: Secondary | ICD-10-CM | POA: Diagnosis not present

## 2021-10-04 DIAGNOSIS — E1129 Type 2 diabetes mellitus with other diabetic kidney complication: Secondary | ICD-10-CM | POA: Diagnosis not present

## 2021-10-04 DIAGNOSIS — R809 Proteinuria, unspecified: Secondary | ICD-10-CM | POA: Diagnosis not present

## 2021-10-04 DIAGNOSIS — E1122 Type 2 diabetes mellitus with diabetic chronic kidney disease: Secondary | ICD-10-CM | POA: Diagnosis not present

## 2021-10-26 DIAGNOSIS — D860 Sarcoidosis of lung: Secondary | ICD-10-CM | POA: Diagnosis not present

## 2021-10-26 DIAGNOSIS — N189 Chronic kidney disease, unspecified: Secondary | ICD-10-CM | POA: Diagnosis not present

## 2021-10-26 DIAGNOSIS — E1165 Type 2 diabetes mellitus with hyperglycemia: Secondary | ICD-10-CM | POA: Diagnosis not present

## 2021-10-26 DIAGNOSIS — I10 Essential (primary) hypertension: Secondary | ICD-10-CM | POA: Diagnosis not present

## 2021-10-26 DIAGNOSIS — E1140 Type 2 diabetes mellitus with diabetic neuropathy, unspecified: Secondary | ICD-10-CM | POA: Diagnosis not present

## 2021-10-26 DIAGNOSIS — E7800 Pure hypercholesterolemia, unspecified: Secondary | ICD-10-CM | POA: Diagnosis not present

## 2021-11-06 DIAGNOSIS — R0981 Nasal congestion: Secondary | ICD-10-CM | POA: Diagnosis not present

## 2021-11-06 DIAGNOSIS — R5383 Other fatigue: Secondary | ICD-10-CM | POA: Diagnosis not present

## 2021-11-06 DIAGNOSIS — R0602 Shortness of breath: Secondary | ICD-10-CM | POA: Diagnosis not present

## 2021-11-06 DIAGNOSIS — R059 Cough, unspecified: Secondary | ICD-10-CM | POA: Diagnosis not present

## 2021-11-06 DIAGNOSIS — U071 COVID-19: Secondary | ICD-10-CM | POA: Diagnosis not present

## 2021-11-06 DIAGNOSIS — H9201 Otalgia, right ear: Secondary | ICD-10-CM | POA: Diagnosis not present

## 2021-11-06 DIAGNOSIS — R6883 Chills (without fever): Secondary | ICD-10-CM | POA: Diagnosis not present

## 2021-11-29 DIAGNOSIS — E559 Vitamin D deficiency, unspecified: Secondary | ICD-10-CM | POA: Diagnosis not present

## 2021-11-29 DIAGNOSIS — E1122 Type 2 diabetes mellitus with diabetic chronic kidney disease: Secondary | ICD-10-CM | POA: Diagnosis not present

## 2021-11-29 DIAGNOSIS — I129 Hypertensive chronic kidney disease with stage 1 through stage 4 chronic kidney disease, or unspecified chronic kidney disease: Secondary | ICD-10-CM | POA: Diagnosis not present

## 2021-11-29 DIAGNOSIS — N189 Chronic kidney disease, unspecified: Secondary | ICD-10-CM | POA: Diagnosis not present

## 2021-11-29 DIAGNOSIS — D638 Anemia in other chronic diseases classified elsewhere: Secondary | ICD-10-CM | POA: Diagnosis not present

## 2021-11-29 DIAGNOSIS — E1129 Type 2 diabetes mellitus with other diabetic kidney complication: Secondary | ICD-10-CM | POA: Diagnosis not present

## 2021-11-29 DIAGNOSIS — R809 Proteinuria, unspecified: Secondary | ICD-10-CM | POA: Diagnosis not present

## 2022-02-13 ENCOUNTER — Other Ambulatory Visit (HOSPITAL_COMMUNITY): Payer: Self-pay | Admitting: Family Medicine

## 2022-02-13 DIAGNOSIS — Z1231 Encounter for screening mammogram for malignant neoplasm of breast: Secondary | ICD-10-CM

## 2022-07-03 ENCOUNTER — Encounter: Payer: Self-pay | Admitting: *Deleted

## 2022-08-02 ENCOUNTER — Encounter: Payer: Self-pay | Admitting: *Deleted

## 2022-08-27 ENCOUNTER — Ambulatory Visit: Payer: PPO | Admitting: Gastroenterology

## 2022-09-17 ENCOUNTER — Ambulatory Visit: Payer: PPO | Admitting: Gastroenterology

## 2022-09-21 ENCOUNTER — Ambulatory Visit (HOSPITAL_COMMUNITY)
Admission: RE | Admit: 2022-09-21 | Discharge: 2022-09-21 | Disposition: A | Discharge status: Home / Self Care | Payer: PPO | Source: Ambulatory Visit | Attending: Family Medicine | Admitting: Family Medicine

## 2022-09-21 DIAGNOSIS — Z1231 Encounter for screening mammogram for malignant neoplasm of breast: Secondary | ICD-10-CM | POA: Insufficient documentation

## 2022-09-24 ENCOUNTER — Other Ambulatory Visit (HOSPITAL_COMMUNITY): Payer: Self-pay | Admitting: Family Medicine

## 2022-09-25 ENCOUNTER — Other Ambulatory Visit (HOSPITAL_COMMUNITY): Payer: Self-pay | Admitting: Family Medicine

## 2022-09-25 DIAGNOSIS — R928 Other abnormal and inconclusive findings on diagnostic imaging of breast: Secondary | ICD-10-CM

## 2022-10-04 ENCOUNTER — Encounter (HOSPITAL_COMMUNITY): Payer: PPO

## 2022-10-04 ENCOUNTER — Ambulatory Visit (HOSPITAL_COMMUNITY)
Admission: RE | Admit: 2022-10-04 | Discharge: 2022-10-04 | Disposition: A | Discharge status: Home / Self Care | Payer: PPO | Source: Ambulatory Visit | Attending: Family Medicine | Admitting: Family Medicine

## 2022-10-04 DIAGNOSIS — R928 Other abnormal and inconclusive findings on diagnostic imaging of breast: Secondary | ICD-10-CM | POA: Insufficient documentation

## 2022-10-09 ENCOUNTER — Other Ambulatory Visit (HOSPITAL_COMMUNITY): Payer: Self-pay | Admitting: Family Medicine

## 2022-10-09 ENCOUNTER — Other Ambulatory Visit: Payer: Self-pay | Admitting: Family Medicine

## 2022-10-09 DIAGNOSIS — R928 Other abnormal and inconclusive findings on diagnostic imaging of breast: Secondary | ICD-10-CM

## 2022-10-09 DIAGNOSIS — R921 Mammographic calcification found on diagnostic imaging of breast: Secondary | ICD-10-CM

## 2022-10-22 ENCOUNTER — Ambulatory Visit
Admission: RE | Admit: 2022-10-22 | Discharge: 2022-10-22 | Disposition: A | Discharge status: Home / Self Care | Payer: PPO | Source: Ambulatory Visit | Attending: Family Medicine | Admitting: Family Medicine

## 2022-10-22 DIAGNOSIS — R921 Mammographic calcification found on diagnostic imaging of breast: Secondary | ICD-10-CM

## 2022-10-22 DIAGNOSIS — R928 Other abnormal and inconclusive findings on diagnostic imaging of breast: Secondary | ICD-10-CM

## 2022-10-22 HISTORY — PX: BREAST BIOPSY: SHX20

## 2023-02-13 ENCOUNTER — Ambulatory Visit: Payer: PPO | Admitting: Internal Medicine

## 2023-02-21 ENCOUNTER — Ambulatory Visit: Payer: PPO | Admitting: Internal Medicine

## 2023-03-07 ENCOUNTER — Ambulatory Visit: Payer: PPO | Admitting: Internal Medicine

## 2023-04-10 ENCOUNTER — Ambulatory Visit: Payer: PPO | Admitting: Internal Medicine

## 2023-07-08 DIAGNOSIS — F1199 Opioid use, unspecified with unspecified opioid-induced disorder: Secondary | ICD-10-CM | POA: Insufficient documentation

## 2023-07-11 ENCOUNTER — Encounter: Payer: Self-pay | Admitting: Orthopedic Surgery

## 2023-07-11 ENCOUNTER — Ambulatory Visit: Payer: PPO | Admitting: Orthopedic Surgery

## 2023-07-11 VITALS — BP 99/62 | HR 57 | Ht 65.0 in | Wt 292.0 lb

## 2023-07-11 DIAGNOSIS — M65312 Trigger thumb, left thumb: Secondary | ICD-10-CM | POA: Diagnosis not present

## 2023-07-11 DIAGNOSIS — M5116 Intervertebral disc disorders with radiculopathy, lumbar region: Secondary | ICD-10-CM | POA: Insufficient documentation

## 2023-07-11 MED ORDER — METHYLPREDNISOLONE ACETATE 40 MG/ML IJ SUSP
40.0000 mg | Freq: Once | INTRAMUSCULAR | Status: AC
  Administered 2023-07-11: 40 mg via INTRA_ARTICULAR

## 2023-07-11 NOTE — Patient Instructions (Addendum)
You have received an injection of steroids into the joint. 15% of patients will have increased pain within the 24 hours postinjection.   This is transient and will go away.   We recommend that you use ice packs on the injection site for 20 minutes every 2 hours and extra strength Tylenol 2 tablets every 8 as needed until the pain resolves.  If you continue to have pain after taking the Tylenol and using the ice please call the office for further instructions.   [facility]  can help you with the back pain  Office address is [address] [place] The phone number is [phone]  The office will call you with an appointment Dr. Christell Constant

## 2023-07-11 NOTE — Progress Notes (Signed)
Patient ID: Hannah Schultz, female   DOB: 1961/03/29, 62 y.o.   MRN: [id number]  ASSESSMENT AND PLAN  62 year old female diabetic triggering of the left thumb plan is for injection may be repeated in 2 weeks if not completely resolved    Chief Complaint  Patient presents with   Hand Problem    Left thumb triggering     HPI Hannah Schultz is a 62 y.o. female.  1 month pain left thumb w catching and locking and burning    Review of Systems Review of Systems  Constitutional:  Positive for diaphoresis.  Musculoskeletal:  Positive for arthralgias and back pain.    Past Medical History:  Diagnosis Date   Acid reflux    Arthritis    Bulging lumbar disc    Cataract    Depression    Diabetes mellitus    Diabetic neuropathy (HCC)    High cholesterol    Hypertension    Post traumatic stress disorder (PTSD)    Sleep apnea     Past Surgical History:  Procedure Laterality Date   ABDOMINAL HYSTERECTOMY     BREAST BIOPSY Left 10/22/2022   MM LT BREAST BX W LOC DEV 1ST LESION IMAGE BX SPEC STEREO GUIDE 10/22/2022 [facility]   CHOLECYSTECTOMY     COLONOSCOPY     EXTRACORPOREAL SHOCK WAVE LITHOTRIPSY Right 02/04/2020   Procedure: EXTRACORPOREAL SHOCK WAVE LITHOTRIPSY (ESWL);  Surgeon: Noel Christmas, MD;  Location: [facility];  Service: Urology;  Laterality: Right;   INCISION AND DRAINAGE ABSCESS N/A 02/06/2018   Procedure: INCISION AND DRAINAGE vulvar ABSCESS;  Surgeon: Zelphia Cairo, MD;  Location: [facility];  Service: Gynecology;  Laterality: N/A;   MEDIASTINOSCOPY N/A 04/25/2017   Procedure: MEDIASTINOSCOPY;  Surgeon: Loreli Slot, MD;  Location: [facility];  Service: Thoracic;  Laterality: N/A;   TONSILLECTOMY  04/02/2012   Procedure: TONSILLECTOMY;  Surgeon: Darletta Moll, MD;  [facility];  Service: ENT;  Laterality: Bilateral;    Family History  Problem Relation Age of Onset   Heart disease Other    Cancer Other    Diabetes Other    Stroke  Mother    Heart attack Mother    Heart attack Father    Leukemia Father    Heart attack Sister    Stroke Sister    Kidney cancer Brother    Aneurysm Brother    Diabetes Brother    Diabetes Brother     Social History Social History   Tobacco Use   Smoking status: Never   Smokeless tobacco: Never  Vaping Use   Vaping status: Never Used  Substance Use Topics   Alcohol use: No   Drug use: No    Allergies  Allergen Reactions   Adhesive [Tape] Other (See Comments)    Adhesive tape "tears skin"    Current Outpatient Medications  Medication Sig Dispense Refill   acetaminophen (TYLENOL) 500 MG tablet Take 1,000 mg by mouth every 6 (six) hours as needed for moderate pain.     Cholecalciferol (VITAMIN D3) 5000 UNITS TABS Take 5,000 Units by mouth daily.      famotidine (PEPCID) 20 MG tablet Take 20 mg by mouth 2 (two) times daily.     FLUoxetine (PROZAC) 20 MG capsule Take 1 capsule (20 mg total) by mouth daily. 30 capsule 3   gabapentin (NEURONTIN) 600 MG tablet Take 600 mg by mouth 3 (three) times daily.     glucose blood (ONE  TOUCH ULTRA TEST) test strip 3 times per day 100 each 0   HYDROcodone-acetaminophen (NORCO/VICODIN) 5-325 MG tablet Take 1 tablet by mouth every 6 (six) hours as needed for severe pain. 10 tablet 0   insulin regular (NOVOLIN R,HUMULIN R) 100 units/mL injection Inject 10-20 Units into the skin 3 (three) times daily before meals. Based on sliding scale     NOVOLIN N RELION 100 UNIT/ML injection Inject 50-70 Units into the skin 3 (three) times daily before meals. 60units breakfast and40 units lunch, 10units at bedtime     pantoprazole (PROTONIX) 40 MG tablet Take 1 tablet (40 mg total) by mouth daily. 30 tablet 3   PROAIR HFA 108 (90 Base) MCG/ACT inhaler Inhale 1-2 puffs into the lungs every 6 (six) hours as needed for wheezing or shortness of breath. 18 g 4   simvastatin (ZOCOR) 40 MG tablet Take 1 tablet (40 mg total) by mouth every evening. 30 tablet 5    metoprolol succinate (TOPROL XL) 50 MG 24 hr tablet Take 1 tablet (50 mg total) by mouth daily. Take with or immediately following a meal. 30 tablet 11   No current facility-administered medications for this visit.     Physical Exam BP 99/62   Pulse (!) 57   Ht 5\' 5"  (1.651 m)   Wt 292 lb (132.5 kg)   BMI 48.59 kg/m  Body mass index is 48.59 kg/m.  Appearance, there are no abnormalities in terms of appearance the patient was well-developed and well-nourished. The grooming and hygiene were normal.  Mental status orientation, there was normal alertness and orientation Mood pleasant Ambulatory status normal with no assistive devices  Examination of the left  hand reveals tenderness over the A1 pulley of the left thumb. Range of motion remains normal with clicking on flexion extension.  Stability tests show no abnormality of the IP joints are MP joint. The FDP and FDS strength is normal Skin warm dry and intact without laceration or ulceration or erythema Neurologic examination normal sensation Vascular examination normal pulses with warm extremity and normal capillary refill    MEDICAL DECISION MAKING  A.  Encounter Diagnosis  Name Primary?   Trigger thumb, left thumb Yes    B. DATA ANALYSED:  IMAGING: None   Orders: see below   Outside records reviewed: none   C. MANAGEMENT   Left Trigger thumb injection Medication  1 mL of 40 mg Depo-Medrol  2 mL of 1% lidocaine plain  Ethyl chloride for anesthesia  Verbal consent was obtained timeout was taken to confirm the injection site as left thumb  Alcohol was used to prepare the skin along with ethyl chloride and then the injection was made at the A1 pulley there were no complications   Meds ordered this encounter  Medications   methylPREDNISolone acetate (DEPO-MEDROL) injection 40 mg

## 2023-08-19 ENCOUNTER — Other Ambulatory Visit: Payer: Self-pay | Admitting: Family Medicine

## 2023-08-19 DIAGNOSIS — Z1231 Encounter for screening mammogram for malignant neoplasm of breast: Secondary | ICD-10-CM

## 2023-08-30 LAB — COLOGUARD: COLOGUARD: NEGATIVE

## 2023-09-26 ENCOUNTER — Ambulatory Visit
Admission: RE | Admit: 2023-09-26 | Discharge: 2023-09-26 | Disposition: A | Discharge status: Home / Self Care | Payer: PPO | Source: Ambulatory Visit | Attending: Family Medicine | Admitting: Family Medicine

## 2023-09-26 DIAGNOSIS — Z1231 Encounter for screening mammogram for malignant neoplasm of breast: Secondary | ICD-10-CM

## 2023-09-27 ENCOUNTER — Other Ambulatory Visit: Payer: Self-pay | Admitting: Neurosurgery

## 2023-09-27 DIAGNOSIS — M5116 Intervertebral disc disorders with radiculopathy, lumbar region: Secondary | ICD-10-CM

## 2023-10-22 ENCOUNTER — Other Ambulatory Visit: Payer: PPO

## 2023-10-24 ENCOUNTER — Ambulatory Visit
Admission: RE | Admit: 2023-10-24 | Discharge: 2023-10-24 | Disposition: A | Discharge status: Home / Self Care | Payer: PPO | Source: Ambulatory Visit | Attending: Neurosurgery | Admitting: Neurosurgery

## 2023-10-24 DIAGNOSIS — M5116 Intervertebral disc disorders with radiculopathy, lumbar region: Secondary | ICD-10-CM

## 2024-04-09 ENCOUNTER — Ambulatory Visit: Payer: Self-pay | Admitting: Podiatry

## 2024-05-28 ENCOUNTER — Other Ambulatory Visit: Payer: Self-pay

## 2024-05-28 ENCOUNTER — Emergency Department (HOSPITAL_COMMUNITY)

## 2024-05-28 ENCOUNTER — Encounter (HOSPITAL_COMMUNITY): Payer: Self-pay

## 2024-05-28 ENCOUNTER — Emergency Department (HOSPITAL_COMMUNITY)
Admission: EM | Admit: 2024-05-28 | Discharge: 2024-05-29 | Disposition: A | Discharge status: Home / Self Care | Attending: Emergency Medicine | Admitting: Emergency Medicine

## 2024-05-28 DIAGNOSIS — Y92410 Unspecified street and highway as the place of occurrence of the external cause: Secondary | ICD-10-CM | POA: Insufficient documentation

## 2024-05-28 DIAGNOSIS — M545 Low back pain, unspecified: Secondary | ICD-10-CM | POA: Insufficient documentation

## 2024-05-28 DIAGNOSIS — Z794 Long term (current) use of insulin: Secondary | ICD-10-CM | POA: Diagnosis not present

## 2024-05-28 MED ORDER — KETOROLAC TROMETHAMINE 15 MG/ML IJ SOLN
15.0000 mg | Freq: Once | INTRAMUSCULAR | Status: AC
  Administered 2024-05-29: 15 mg via INTRAMUSCULAR
  Filled 2024-05-28: qty 1

## 2024-05-28 MED ORDER — ACETAMINOPHEN 500 MG PO TABS
1000.0000 mg | ORAL_TABLET | Freq: Once | ORAL | Status: AC
  Administered 2024-05-29: 1000 mg via ORAL
  Filled 2024-05-28: qty 2

## 2024-05-28 MED ORDER — OXYCODONE HCL 5 MG PO TABS
5.0000 mg | ORAL_TABLET | Freq: Once | ORAL | Status: AC
  Administered 2024-05-29: 5 mg via ORAL
  Filled 2024-05-28: qty 1

## 2024-05-28 MED ORDER — DIAZEPAM 5 MG PO TABS
5.0000 mg | ORAL_TABLET | Freq: Once | ORAL | Status: AC
  Administered 2024-05-29: 5 mg via ORAL
  Filled 2024-05-28: qty 1
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
## 2024-05-29 MED ORDER — DICLOFENAC SODIUM 1 % EX GEL: 4.0000 g | Freq: Four times a day (QID) | CUTANEOUS | 0 refills | Status: DC

## 2024-05-29 MED ORDER — METHYLPREDNISOLONE 4 MG PO TBPK: ORAL_TABLET | ORAL | 0 refills | Status: DC

## 2024-05-29 MED ORDER — METHOCARBAMOL 500 MG PO TABS: 500.0000 mg | ORAL_TABLET | Freq: Two times a day (BID) | ORAL | 0 refills | Status: DC

## 2024-05-29 NOTE — Discharge Instructions (Signed)
Your back pain is most likely due to a muscular strain.  There is been a lot of research on back pain, unfortunately the only thing that seems to really help is Tylenol and ibuprofen.  Relative rest is also important to not lift greater than 10 pounds bending or twisting at the waist.  Please follow-up with your family physician.  The other thing that really seems to benefit patients is physical therapy which your doctor may send you for.  Please return to the emergency department for new numbness or weakness to your arms or legs. Difficulty with urinating or urinating or pooping on yourself.  Also if you cannot feel toilet paper when you wipe or get a fever.   Take the steroids as prescribed.  Use the gel as prescribed Also take tylenol '1000mg'$ (2 extra strength) four times a day.
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
## 2024-08-21 ENCOUNTER — Other Ambulatory Visit: Payer: Self-pay | Admitting: Surgery

## 2024-08-21 ENCOUNTER — Ambulatory Visit
Admission: RE | Admit: 2024-08-21 | Discharge: 2024-08-21 | Disposition: A | Discharge status: Home / Self Care | Source: Ambulatory Visit | Attending: Surgery | Admitting: Surgery

## 2024-08-21 DIAGNOSIS — M5416 Radiculopathy, lumbar region: Secondary | ICD-10-CM

## 2024-08-21 DIAGNOSIS — M542 Cervicalgia: Secondary | ICD-10-CM

## 2024-09-08 ENCOUNTER — Other Ambulatory Visit: Payer: Self-pay | Admitting: Surgery

## 2024-09-08 DIAGNOSIS — M542 Cervicalgia: Secondary | ICD-10-CM

## 2024-09-08 DIAGNOSIS — M5416 Radiculopathy, lumbar region: Secondary | ICD-10-CM

## 2024-09-12 ENCOUNTER — Emergency Department (HOSPITAL_COMMUNITY)

## 2024-09-12 ENCOUNTER — Emergency Department (HOSPITAL_COMMUNITY)
Admission: EM | Admit: 2024-09-12 | Discharge: 2024-09-14 | Disposition: A | Discharge status: Skilled Nursing Facility | Attending: Emergency Medicine | Admitting: Emergency Medicine

## 2024-09-12 ENCOUNTER — Other Ambulatory Visit: Payer: Self-pay

## 2024-09-12 ENCOUNTER — Encounter (HOSPITAL_COMMUNITY): Payer: Self-pay | Admitting: Emergency Medicine

## 2024-09-12 DIAGNOSIS — W19XXXA Unspecified fall, initial encounter: Secondary | ICD-10-CM | POA: Diagnosis not present

## 2024-09-12 DIAGNOSIS — S8001XA Contusion of right knee, initial encounter: Secondary | ICD-10-CM | POA: Insufficient documentation

## 2024-09-12 DIAGNOSIS — I129 Hypertensive chronic kidney disease with stage 1 through stage 4 chronic kidney disease, or unspecified chronic kidney disease: Secondary | ICD-10-CM | POA: Insufficient documentation

## 2024-09-12 DIAGNOSIS — G8929 Other chronic pain: Secondary | ICD-10-CM | POA: Insufficient documentation

## 2024-09-12 DIAGNOSIS — Z79899 Other long term (current) drug therapy: Secondary | ICD-10-CM | POA: Insufficient documentation

## 2024-09-12 DIAGNOSIS — M545 Low back pain, unspecified: Secondary | ICD-10-CM | POA: Diagnosis not present

## 2024-09-12 DIAGNOSIS — N189 Chronic kidney disease, unspecified: Secondary | ICD-10-CM | POA: Diagnosis not present

## 2024-09-12 DIAGNOSIS — E1122 Type 2 diabetes mellitus with diabetic chronic kidney disease: Secondary | ICD-10-CM | POA: Diagnosis not present

## 2024-09-12 DIAGNOSIS — R001 Bradycardia, unspecified: Secondary | ICD-10-CM | POA: Diagnosis present

## 2024-09-12 LAB — CBC WITH DIFFERENTIAL/PLATELET
Abs Immature Granulocytes: 0 K/uL (ref 0.00–0.07)
Basophils Absolute: 0 K/uL (ref 0.0–0.1)
Basophils Relative: 1 %
Eosinophils Absolute: 0.1 K/uL (ref 0.0–0.5)
Eosinophils Relative: 1 %
HCT: 39.3 % (ref 36.0–46.0)
Hemoglobin: 12.2 g/dL (ref 12.0–15.0)
Immature Granulocytes: 0 %
Lymphocytes Relative: 37 %
Lymphs Abs: 1.7 K/uL (ref 0.7–4.0)
MCH: 25.6 pg — ABNORMAL LOW (ref 26.0–34.0)
MCHC: 31 g/dL (ref 30.0–36.0)
MCV: 82.4 fL (ref 80.0–100.0)
Monocytes Absolute: 0.4 K/uL (ref 0.1–1.0)
Monocytes Relative: 9 %
Neutro Abs: 2.5 K/uL (ref 1.7–7.7)
Neutrophils Relative %: 52 %
Platelets: 221 K/uL (ref 150–400)
RBC: 4.77 MIL/uL (ref 3.87–5.11)
RDW: 15 % (ref 11.5–15.5)
WBC: 4.7 K/uL (ref 4.0–10.5)
nRBC: 0 % (ref 0.0–0.2)

## 2024-09-12 LAB — COMPREHENSIVE METABOLIC PANEL WITH GFR
ALT: 16 U/L (ref 0–44)
AST: 31 U/L (ref 15–41)
Albumin: 4 g/dL (ref 3.5–5.0)
Alkaline Phosphatase: 65 U/L (ref 38–126)
Anion gap: 13 (ref 5–15)
BUN: 26 mg/dL — ABNORMAL HIGH (ref 8–23)
CO2: 23 mmol/L (ref 22–32)
Calcium: 9.5 mg/dL (ref 8.9–10.3)
Chloride: 105 mmol/L (ref 98–111)
Creatinine, Ser: 1.82 mg/dL — ABNORMAL HIGH (ref 0.44–1.00)
GFR, Estimated: 31 mL/min — ABNORMAL LOW (ref >60)
Glucose, Bld: 98 mg/dL (ref 70–99)
Potassium: 4.7 mmol/L (ref 3.5–5.1)
Sodium: 141 mmol/L (ref 135–145)
Total Bilirubin: 0.3 mg/dL (ref 0.0–1.2)
Total Protein: 6.9 g/dL (ref 6.5–8.1)

## 2024-09-12 LAB — CBG MONITORING, ED: Glucose-Capillary: 76 mg/dL (ref 70–99)

## 2024-09-12 MED ORDER — HYDROMORPHONE HCL 1 MG/ML IJ SOLN
1.0000 mg | Freq: Once | INTRAMUSCULAR | Status: AC
  Administered 2024-09-12: 1 mg via INTRAVENOUS
  Filled 2024-09-12: qty 1

## 2024-09-12 MED ORDER — HYDROCODONE-ACETAMINOPHEN 5-325 MG PO TABS
1.0000 | ORAL_TABLET | Freq: Four times a day (QID) | ORAL | Status: DC | PRN
  Administered 2024-09-13 (×2): 1 via ORAL
  Filled 2024-09-12 (×2): qty 1

## 2024-09-12 MED ORDER — DICLOFENAC SODIUM 1 % EX GEL: 4.0000 g | Freq: Four times a day (QID) | CUTANEOUS | Status: DC | PRN

## 2024-09-12 MED ORDER — METHOCARBAMOL 500 MG PO TABS
500.0000 mg | ORAL_TABLET | Freq: Two times a day (BID) | ORAL | Status: DC
  Administered 2024-09-12 – 2024-09-14 (×4): 500 mg via ORAL
  Filled 2024-09-12 (×4): qty 1

## 2024-09-12 MED ORDER — FLUOXETINE HCL 20 MG PO CAPS
20.0000 mg | ORAL_CAPSULE | Freq: Every day | ORAL | Status: DC
  Administered 2024-09-13 – 2024-09-14 (×2): 20 mg via ORAL
  Filled 2024-09-12 (×2): qty 1

## 2024-09-12 MED ORDER — MORPHINE SULFATE (PF) 4 MG/ML IV SOLN
4.0000 mg | Freq: Once | INTRAVENOUS | Status: AC
  Administered 2024-09-12: 4 mg via INTRAVENOUS
  Filled 2024-09-12: qty 1

## 2024-09-12 MED ORDER — SODIUM CHLORIDE 0.9 % IV BOLUS
1000.0000 mL | Freq: Once | INTRAVENOUS | Status: AC
  Administered 2024-09-12: 1000 mL via INTRAVENOUS

## 2024-09-12 MED ORDER — INSULIN ASPART 100 UNIT/ML IJ SOLN
0.0000 [IU] | Freq: Three times a day (TID) | INTRAMUSCULAR | Status: DC
  Administered 2024-09-13: 4 [IU] via SUBCUTANEOUS
  Administered 2024-09-13: 3 [IU] via SUBCUTANEOUS
  Administered 2024-09-14: 4 [IU] via SUBCUTANEOUS
  Filled 2024-09-12 (×3): qty 1

## 2024-09-12 MED ORDER — GABAPENTIN 300 MG PO CAPS
600.0000 mg | ORAL_CAPSULE | Freq: Three times a day (TID) | ORAL | Status: DC
  Administered 2024-09-12 – 2024-09-14 (×6): 600 mg via ORAL
  Filled 2024-09-12 (×6): qty 2

## 2024-09-12 MED ORDER — ONDANSETRON HCL 4 MG/2ML IJ SOLN
4.0000 mg | Freq: Once | INTRAMUSCULAR | Status: AC
  Administered 2024-09-12: 4 mg via INTRAVENOUS
  Filled 2024-09-12: qty 2

## 2024-09-12 MED ORDER — FAMOTIDINE 20 MG PO TABS
20.0000 mg | ORAL_TABLET | Freq: Two times a day (BID) | ORAL | Status: DC
  Administered 2024-09-12 – 2024-09-14 (×4): 20 mg via ORAL
  Filled 2024-09-12 (×4): qty 1

## 2024-09-12 MED ORDER — VITAMIN D 25 MCG (1000 UNIT) PO TABS
5000.0000 [IU] | ORAL_TABLET | Freq: Every day | ORAL | Status: DC
  Administered 2024-09-13 – 2024-09-14 (×2): 5000 [IU] via ORAL
  Filled 2024-09-12 (×2): qty 5

## 2024-09-12 MED ORDER — INSULIN NPH (HUMAN) (ISOPHANE) 100 UNIT/ML ~~LOC~~ SUSP
50.0000 [IU] | Freq: Three times a day (TID) | SUBCUTANEOUS | Status: DC
  Administered 2024-09-13 (×2): 50 [IU] via SUBCUTANEOUS
  Filled 2024-09-12: qty 10

## 2024-09-12 NOTE — ED Notes (Signed)
 Pt unable to sit up on side of bed. Screaming out in pain.

## 2024-09-12 NOTE — ED Triage Notes (Signed)
 BIB [facility] from home w/ c/o weakness & unwitnessed fall today. C/o rt knee pain & back pain. Denies blood thinners. Pt doesn't remember falling- unk if she passed out. VSS 172 CBG  2 steroid injections done Thursday & since then been feeling weaker.

## 2024-09-12 NOTE — ED Notes (Signed)
Pt eating turkey sandwich and drinking gingerale.  

## 2024-09-12 NOTE — ED Notes (Signed)
 Pt unable to bear wt on rt leg.

## 2024-09-12 NOTE — ED Provider Notes (Signed)
 [place] EMERGENCY DEPARTMENT AT [facility] Provider Note   CSN: [id number] Arrival date & time: 09/12/24  8078     Patient presents with: Fall and Weakness   Hannah Schultz is a 63 y.o. female.   Pt is a 63 yo female with pmhx significant for htn, dm, depression, ckd, ptsd, gerd, hld, chronic lbp, and arthritis.  Pt said she received injections into her back on Thursday, 10/2.  Pt said her back has been hurting more and she's been feeling weak.  She fell today and landed on her right knee.  She has more pain in her back and in her right knee.  She is bradycardic with HR in the 40s.  Pt said that is not normal for her.       Prior to Admission medications   Medication Sig Start Date End Date Taking? Authorizing Provider  acetaminophen  (TYLENOL ) 500 MG tablet Take 1,000 mg by mouth every 6 (six) hours as needed for moderate pain.    [provider]  Cholecalciferol (VITAMIN D3) 5000 UNITS TABS Take 5,000 Units by mouth daily.     [provider]  diclofenac  Sodium (VOLTAREN ) 1 % GEL Apply 4 g topically 4 (four) times daily. 05/29/24   Emil Share, DO  famotidine  (PEPCID ) 20 MG tablet Take 20 mg by mouth 2 (two) times daily.    [provider]  FLUoxetine  (PROZAC ) 20 MG capsule Take 1 capsule (20 mg total) by mouth daily. 12/23/20   Pearlean Manus, MD  gabapentin (NEURONTIN) 600 MG tablet Take 600 mg by mouth 3 (three) times daily. 03/08/20   [provider]  glucose blood (ONE TOUCH ULTRA TEST) test strip 3 times per day 12/29/13   Von [place], MD  HYDROcodone -acetaminophen  (NORCO/VICODIN) 5-325 MG tablet Take 1 tablet by mouth every 6 (six) hours as needed for severe pain. 09/06/21   Claudene Lenis, NP  insulin  regular (NOVOLIN R,HUMULIN  R) 100 units/mL injection Inject 10-20 Units into the skin 3 (three) times daily before meals. Based on sliding scale    [provider]  methocarbamol  (ROBAXIN ) 500 MG tablet Take 1 tablet (500 mg  total) by mouth 2 (two) times daily. 05/29/24   Floyd, Dan, DO  methylPREDNISolone  (MEDROL  DOSEPAK) 4 MG TBPK tablet Day 1: 8mg  before breakfast, 4 mg after lunch, 4 mg after supper, and 8 mg at bedtime Day 2: 4 mg before breakfast, 4 mg after lunch, 4 mg  after supper, and 8 mg  at bedtime Day 3:  4 mg  before breakfast, 4 mg  after lunch, 4 mg after supper, and 4 mg  at bedtime Day 4: 4 mg  before breakfast, 4 mg  after lunch, and 4 mg at bedtime Day 5: 4 mg  before breakfast and 4 mg at bedtime Day 6: 4 mg  before breakfast 05/29/24   Floyd, Dan, DO  metoprolol  succinate (TOPROL  XL) 50 MG 24 hr tablet Take 1 tablet (50 mg total) by mouth daily. Take with or immediately following a meal. 12/23/20 12/23/21  Emokpae, Courage, MD  NOVOLIN N RELION 100 UNIT/ML injection Inject 50-70 Units into the skin 3 (three) times daily before meals. 60units breakfast and40 units lunch, 10units at bedtime 12/13/16   [provider]  pantoprazole  (PROTONIX ) 40 MG tablet Take 1 tablet (40 mg total) by mouth daily. 12/23/20   Pearlean Manus, MD  PROAIR  HFA 108 (90 Base) MCG/ACT inhaler Inhale 1-2 puffs into the lungs every 6 (six) hours as  needed for wheezing or shortness of breath. 12/23/20   Pearlean Manus, MD  simvastatin  (ZOCOR ) 40 MG tablet Take 1 tablet (40 mg total) by mouth every evening. 12/23/20   Pearlean Manus, MD    Allergies: Adhesive [tape]    Review of Systems  Musculoskeletal:        Back and right knee pain  All other systems reviewed and are negative.   Updated Vital Signs BP 106/67   Pulse (!) 46   Temp 98 F (36.7 C) (Oral)   Resp 15   SpO2 95%   Physical Exam Vitals and nursing note reviewed.  Constitutional:      Appearance: Normal appearance. She is obese.  HENT:     Head: Normocephalic and atraumatic.     Right Ear: External ear normal.     Left Ear: External ear normal.     Nose: Nose normal.     Mouth/Throat:     Mouth: Mucous membranes are moist.     Pharynx:  Oropharynx is clear.  Eyes:     Extraocular Movements: Extraocular movements intact.     Conjunctiva/sclera: Conjunctivae normal.     Pupils: Pupils are equal, round, and reactive to light.  Cardiovascular:     Rate and Rhythm: Regular rhythm. Bradycardia present.     Pulses: Normal pulses.     Heart sounds: Normal heart sounds.  Pulmonary:     Effort: Pulmonary effort is normal.     Breath sounds: Normal breath sounds.  Abdominal:     General: Abdomen is flat. Bowel sounds are normal.     Palpations: Abdomen is soft.  Musculoskeletal:     Cervical back: Normal range of motion and neck supple.       Back:     Right knee: Bony tenderness present.  Skin:    General: Skin is warm.     Capillary Refill: Capillary refill takes less than 2 seconds.  Neurological:     General: No focal deficit present.     Mental Status: She is alert and oriented to person, place, and time.  Psychiatric:        Mood and Affect: Mood normal.        Behavior: Behavior normal.     (all labs ordered are listed, but only abnormal results are displayed) Labs Reviewed  CBC WITH DIFFERENTIAL/PLATELET - Abnormal; Notable for the following components:      Result Value   MCH 25.6 (*)    All other components within normal limits  COMPREHENSIVE METABOLIC PANEL WITH GFR - Abnormal; Notable for the following components:   BUN 26 (*)    Creatinine, Ser 1.82 (*)    GFR, Estimated 31 (*)    All other components within normal limits  HEMOGLOBIN A1C  CBG MONITORING, ED    EKG: EKG Interpretation Date/Time:  Saturday September 12 2024 19:33:39 EDT Ventricular Rate:  46 PR Interval:  154 QRS Duration:  88 QT Interval:  461 QTC Calculation: 404 R Axis:   5  Text Interpretation: Sinus bradycardia Since last tracing rate slower Confirmed by Dean Clarity [phone]) on 09/12/2024 9:25:26 PM  Radiology: [facility] Lumbar Spine Complete Result Date: 09/12/2024 CLINICAL DATA:  Low back pain.  Pain radiates down the  legs. EXAM: LUMBAR SPINE - COMPLETE 4+ VIEW COMPARISON:  Radiograph 08/21/2024 FINDINGS: Five non-rib-bearing lumbar vertebra. Normal lumbar alignment. Normal vertebral body heights. No fracture. No visible pars defects. Stable multilevel anterior spurring. Stable L4-L5 and L5-S1 facet hypertrophy. Sacroiliac joints  are congruent. IMPRESSION: Stable multilevel degenerative change in the lumbar spine. No acute findings. Electronically Signed   By: Andrea Gasman M.D.   On: 09/12/2024 21:09   DG Knee Complete 4 Views Right Result Date: 09/12/2024 CLINICAL DATA:  Injury.  Weakness, unwitnessed fall. EXAM: RIGHT KNEE - COMPLETE 4+ VIEW COMPARISON:  None Available. FINDINGS: No acute fracture or dislocation. Tricompartmental peripheral spurring and mild diffuse joint space narrowing. No significant joint effusion. No erosive change or focal bone abnormality. Mild soft tissue edema. IMPRESSION: 1. No acute fracture or dislocation. 2. Mild tricompartmental osteoarthritis. Electronically Signed   By: Andrea Gasman M.D.   On: 09/12/2024 20:31     Procedures   Medications Ordered in the ED  Vitamin D3 TABS 5,000 Units (has no administration in time range)  diclofenac  Sodium (VOLTAREN ) 1 % topical gel 4 g (has no administration in time range)  famotidine  (PEPCID ) tablet 20 mg (has no administration in time range)  FLUoxetine  (PROZAC ) capsule 20 mg (has no administration in time range)  gabapentin (NEURONTIN) tablet 600 mg (has no administration in time range)  HYDROcodone -acetaminophen  (NORCO/VICODIN) 5-325 MG per tablet 1 tablet (has no administration in time range)  methocarbamol  (ROBAXIN ) tablet 500 mg (has no administration in time range)  insulin  NPH Human (NOVOLIN N) injection 50-70 Units (has no administration in time range)  insulin  aspart (novoLOG ) injection 0-20 Units (has no administration in time range)  morphine  (PF) 4 MG/ML injection 4 mg (4 mg Intravenous Given 09/12/24 2032)  ondansetron   (ZOFRAN ) injection 4 mg (4 mg Intravenous Given 09/12/24 2032)  sodium chloride  0.9 % bolus 1,000 mL (1,000 mLs Intravenous New Bag/Given 09/12/24 2032)  HYDROmorphone  (DILAUDID ) injection 1 mg (1 mg Intravenous Given 09/12/24 2125)                                    Medical Decision Making Amount and/or Complexity of Data Reviewed Labs: ordered. Radiology: ordered.  Risk OTC drugs. Prescription drug management.   This patient presents to the ED for concern of fall with back and knee pain, this involves an extensive number of treatment options, and is a complaint that carries with it a high risk of complications and morbidity.  The differential diagnosis includes fx, strain   Co morbidities that complicate the patient evaluation  htn, dm, depression, ckd, ptsd, gerd, hld, chronic lbp, and arthritis   Additional history obtained:  Additional history obtained from epic chart review External records from outside source obtained and reviewed including EMS report/family   Lab Tests:  I Ordered, and personally interpreted labs.  The pertinent results include:  cbc nl, cmp with cr 1.82 (stable)   Imaging Studies ordered:  I ordered imaging studies including lumbar and r knee xr  I independently visualized and interpreted imaging which showed  Lumbar: Stable multilevel degenerative change in the lumbar spine. No acute  findings.  R knee: No acute fracture or dislocation.  2. Mild tricompartmental osteoarthritis.   I agree with the radiologist interpretation   Cardiac Monitoring:  The patient was maintained on a cardiac monitor.  I personally viewed and interpreted the cardiac monitored which showed an underlying rhythm of: sb   Medicines ordered and prescription drug management:  I ordered medication including morphine /dilaudid   for pain  Reevaluation of the patient after these medicines showed that the patient improved I have reviewed the patients home medicines and  have made adjustments  as needed   Consultations Obtained:  I requested consultation with TOC/PT,  and discussed lab and imaging findings as well as pertinent plan -pending   Problem List / ED Course:  Fall with acute on chronic back pain.  Pain is still severe.  Pt unable to get up and walk.  She is in agreement to get a pt eval and go to rehab if possible. Bradycardia:  on metoprolol .  I am going to hold her metoprolol  for now.   Reevaluation:  After the interventions noted above, I reevaluated the patient and found that they have :improved   Social Determinants of Health:  Lives at home   Dispostion:  After consideration of the diagnostic results and the patients response to treatment, I feel that the patent would benefit from rehab.       Final diagnoses:  Bradycardia  Acute on chronic low back pain  Contusion of right knee, initial encounter    ED Discharge Orders     None          Dean Clarity, MD 09/12/24 2228

## 2024-09-13 LAB — CBG MONITORING, ED
Glucose-Capillary: 107 mg/dL — ABNORMAL HIGH (ref 70–99)
Glucose-Capillary: 112 mg/dL — ABNORMAL HIGH (ref 70–99)
Glucose-Capillary: 128 mg/dL — ABNORMAL HIGH (ref 70–99)
Glucose-Capillary: 149 mg/dL — ABNORMAL HIGH (ref 70–99)
Glucose-Capillary: 166 mg/dL — ABNORMAL HIGH (ref 70–99)
Glucose-Capillary: 173 mg/dL — ABNORMAL HIGH (ref 70–99)
Glucose-Capillary: 93 mg/dL (ref 70–99)

## 2024-09-13 LAB — HEMOGLOBIN A1C
Hgb A1c MFr Bld: 7.8 % — ABNORMAL HIGH (ref 4.8–5.6)
Mean Plasma Glucose: 177.16 mg/dL

## 2024-09-13 NOTE — ED Notes (Signed)
 Patient repositioned in the bed.

## 2024-09-13 NOTE — ED Notes (Signed)
 Transition of Care [facility]) - Emergency Department Mini Assessment   Patient Details  Name: Hannah Schultz MRN: [id number] Date of Birth: [birth year]  [facility] CM/SW Contact:    Lucie Lunger, LCSWA Phone Number: 09/13/2024, 12:18 PM   Clinical Narrative: CSW notes TOC consult for SNF placement. CSW updated that PT is recommending SNF. CSW spoke to pt about this, she states she is agreeable to SNF placement. CSW to complete referral and send out for review. TOC to follow.   ED Mini Assessment:    Barriers to Discharge: Continued Medical Work up             Patient Contact and Communications        ,          Patient states their goals for this hospitalization and ongoing recovery are:: get better CMS Medicare.gov Compare Post Acute Care list provided to:: Patient Choice offered to / list presented to : Patient  Admission diagnosis:  back pain Patient Active Problem List   Diagnosis Date Noted   Lumbar disc disease with radiculopathy 07/11/2023   Opioid use, unspecified with unspecified opioid-induced disorder (HCC) 07/08/2023   Pulmonary HTN (HCC) 05/30/2021   Atypical chest pain 12/22/2020   Elevated d-dimer 12/22/2020   Acute kidney injury superimposed on CKD 12/22/2020   Hypoglycemia 12/22/2020   GERD (gastroesophageal reflux disease) 12/22/2020   Obesity, Class III, BMI 40-49.9 (morbid obesity) (HCC) 12/22/2020   Chronic low back pain with bilateral sciatica 12/22/2020   Swelling of left lower extremity 12/22/2020   OSA (obstructive sleep apnea) 07/11/2020   Nausea and vomiting 12/21/2019   Stage 3 chronic kidney disease (HCC) 12/21/2019   Lumbar spondylosis 10/13/2019   Type 2 diabetes mellitus with diabetic chronic kidney disease (HCC) 09/24/2019   Sarcoidosis 08/18/2019   Morbid obesity (HCC) 08/18/2019   Family history of sarcoidosis 08/18/2019   Vulvar abscess 02/07/2018   Hyperlipidemia 09/19/2013   Essential hypertension  09/19/2013   Dyslipidemia 09/19/2013   Type 2 diabetes mellitus without complication (HCC) 07/20/2013   Trigger thumb of right hand 11/27/2012   CTS (carpal tunnel syndrome) 11/27/2012   PCP:  Macarthur Elouise SQUIBB, DO Pharmacy:   [id number] - [place], [place] - [address] [place] [address] Phone: [phone] Fax: [phone]

## 2024-09-13 NOTE — ED Notes (Addendum)
 Pt's O2 level drops while asleep. Spoke to pt about sleep apnea and whether or not pt wears a CPAP. Pt states they wear a CPAP sometimes. Asked pt if they would like to wear a CPAP while at the hospital. Pt and pt's family replied yes. RT called to bring CPAP

## 2024-09-13 NOTE — Evaluation (Signed)
 Physical Therapy Evaluation Patient Details Name: Hannah Schultz MRN: [id number] DOB: 1961-02-10 Today's Date: 09/13/2024  History of Present Illness  GRICELDA FOLAND is a 63 y.o. female.        Pt is a 63 yo female with pmhx significant for htn, dm, depression, ckd, ptsd, gerd, hld, chronic lbp, and arthritis.  Pt said she received injections into her back on Thursday, 10/2.  Pt said her back has been hurting more and she's been feeling weak.  She fell today and landed on her right knee.  She has more pain in her back and in her right knee.  She is bradycardic with HR in the 40s.  Pt said that is not normal for her.   Clinical Impression  Pt was agreeable to completing today's PT evaluation. Her son and bother were present to assist with answering questions about he home environment and prior level of function. Her primary limitation with today's functional mobility as she required maxA with supine to sit and sit to supine transfers for RLE mobility and achieving upright stance. However, once sitting she was able to transfers to standing with modA. She was able to ambulate with a step to pattern utilizing a RW and modA. She was left in bed with her call bell within reach and family present. RN was notified of her mobility during PT. Patient will benefit from continued skilled physical therapy in hospital and recommended venue below to increase strength, balance, endurance for safe ADLs and gait.         If plan is discharge home, recommend the following: A lot of help with walking and/or transfers;A lot of help with bathing/dressing/bathroom;Assistance with cooking/housework;Assist for transportation;Help with stairs or ramp for entrance   Can travel by private vehicle   No    Equipment Recommendations None recommended by PT  Recommendations for Other Services       Functional Status Assessment Patient has had a recent decline in their functional status and demonstrates the ability to make  significant improvements in function in a reasonable and predictable amount of time.     Precautions / Restrictions Precautions Precautions: Fall Recall of Precautions/Restrictions: Intact Restrictions Weight Bearing Restrictions Per Provider Order: No      Mobility  Bed Mobility Overal bed mobility: Needs Assistance Bed Mobility: Supine to Sit, Sit to Supine     Supine to sit: Max assist, HOB elevated Sit to supine: Max assist, HOB elevated        Transfers Overall transfer level: Needs assistance Equipment used: Rolling walker (2 wheels) Transfers: Sit to/from Stand Sit to Stand: Mod assist                Ambulation/Gait Ambulation/Gait assistance: Mod assist Gait Distance (Feet): 4 Feet Assistive device: Rolling walker (2 wheels) Gait Pattern/deviations: Step-to pattern, Decreased stride length, Decreased stance time - right, Shuffle Gait velocity: slow     General Gait Details: slow and labored movement secondary to pain  Stairs            Wheelchair Mobility     Tilt Bed    Modified Rankin (Stroke Patients Only)       Balance Overall balance assessment: Needs assistance Sitting-balance support: No upper extremity supported, Feet supported Sitting balance-Leahy Scale: Poor Sitting balance - Comments: seated EOB   Standing balance support: Bilateral upper extremity supported, During functional activity, Reliant on assistive device for balance Standing balance-Leahy Scale: Poor Standing balance comment: with RW  Pertinent Vitals/Pain Pain Assessment Pain Assessment: 0-10 Pain Score: 10-Worst pain ever Pain Location: right knee Pain Descriptors / Indicators: Grimacing, Sharp, Moaning Pain Intervention(s): Limited activity within patient's tolerance, Monitored during session, Repositioned    Home Living Family/patient expects to be discharged to:: Private residence Living Arrangements:  Spouse/significant other Available Help at Discharge: Family;Available PRN/intermittently Type of Home: House Home Access: Stairs to enter Entrance Stairs-Rails: None Entrance Stairs-Number of Steps: 1   Home Layout: One level Home Equipment: Cane - single point      Prior Function Prior Level of Function : Needs assist;History of Falls (last six months)       Physical Assist : ADLs (physical)   ADLs (physical): IADLs Mobility Comments: Pt was community ambulator w/ SPC. ADLs Comments: Pt was independent with all ADL's     Extremity/Trunk Assessment   Upper Extremity Assessment Upper Extremity Assessment: Generalized weakness    Lower Extremity Assessment Lower Extremity Assessment: Generalized weakness    Cervical / Trunk Assessment Cervical / Trunk Assessment: Normal  Communication   Communication Communication: No apparent difficulties    Cognition Arousal: Alert Behavior During Therapy: WFL for tasks assessed/performed   PT - Cognitive impairments: No apparent impairments                         Following commands: Intact       Cueing Cueing Techniques: Verbal cues, Tactile cues, Gestural cues     General Comments      Exercises     Assessment/Plan    PT Assessment Patient needs continued PT services  PT Problem List Decreased strength;Decreased activity tolerance;Decreased balance;Decreased mobility;Decreased knowledge of use of DME;Pain       PT Treatment Interventions DME instruction;Gait training;Stair training;Therapeutic activities;Functional mobility training;Therapeutic exercise;Balance training;Patient/family education    PT Goals (Current goals can be found in the Care Plan section)  Acute Rehab PT Goals Patient Stated Goal: Pt would like to go to a rehab facilty to improve her mobility. PT Goal Formulation: With patient/family Time For Goal Achievement: 09/25/24 Potential to Achieve Goals: Good    Frequency Min  3X/week     Co-evaluation               AM-PAC PT 6 Clicks Mobility  Outcome Measure Help needed turning from your back to your side while in a flat bed without using bedrails?: A Lot Help needed moving from lying on your back to sitting on the side of a flat bed without using bedrails?: A Lot Help needed moving to and from a bed to a chair (including a wheelchair)?: A Lot Help needed standing up from a chair using your arms (e.g., wheelchair or bedside chair)?: A Lot Help needed to walk in hospital room?: A Lot Help needed climbing 3-5 steps with a railing? : Total 6 Click Score: 11    End of Session Equipment Utilized During Treatment: Gait belt Activity Tolerance: Patient limited by pain Patient left: in bed;with call bell/phone within reach;with family/visitor present Nurse Communication: Mobility status PT Visit Diagnosis: Unsteadiness on feet (R26.81);Muscle weakness (generalized) (M62.81);Difficulty in walking, not elsewhere classified (R26.2)    Time: 1133-1207 PT Time Calculation (min) (ACUTE ONLY): 34 min   Charges:   PT Evaluation $PT Eval Moderate Complexity: 1 Mod PT Treatments $Therapeutic Activity: 8-22 mins PT General Charges $$ ACUTE PT VISIT: 1 Visit         Lacinda Fass, PT, DPT  09/13/2024, 12:24 PM

## 2024-09-13 NOTE — ED Provider Notes (Signed)
 Emergency Medicine Observation Re-evaluation Note  Hannah Schultz is a 63 y.o. female, seen on rounds today.  Pt initially presented to the ED for complaints of Fall and Weakness Currently, the patient is resting .  Physical Exam  BP 114/64   Pulse (!) 51   Temp 98 F (36.7 C) (Oral)   Resp 13   SpO2 91%  Physical Exam General: NAD   ED Course / MDM  EKG:EKG Interpretation Date/Time:  Saturday September 12 2024 19:33:39 EDT Ventricular Rate:  46 PR Interval:  154 QRS Duration:  88 QT Interval:  461 QTC Calculation: 404 R Axis:   5  Text Interpretation: Sinus bradycardia Since last tracing rate slower Confirmed by Dean Clarity [phone]) on 09/12/2024 9:25:26 PM  I have reviewed the labs performed to date as well as medications administered while in observation.  Recent changes in the last 24 hours include none .  Plan  Current plan is for pending social work and physical therapy placement.  She has been medically clear.  She initially presented because of fall with acute on chronic back pain.  Unable to get up or walk.SABRA Simon Lavonia LOISE, MD 09/13/24 [phone]

## 2024-09-13 NOTE — Plan of Care (Signed)
  Problem: Acute Rehab PT Goals(only PT should resolve) Goal: Pt Will Go Supine/Side To Sit Outcome: Progressing Flowsheets (Taken 09/13/2024 1231) Pt will go Supine/Side to Sit: with moderate assist Goal: Pt Will Go Sit To Supine/Side Outcome: Progressing Flowsheets (Taken 09/13/2024 1231) Pt will go Sit to Supine/Side: with moderate assist Goal: Patient Will Transfer Sit To/From Stand Outcome: Progressing Flowsheets (Taken 09/13/2024 1231) Patient will transfer sit to/from stand: with minimal assist Goal: Pt Will Transfer Bed To Chair/Chair To Bed Outcome: Progressing Flowsheets (Taken 09/13/2024 1231) Pt will Transfer Bed to Chair/Chair to Bed: with min assist Goal: Pt Will Ambulate Outcome: Progressing Flowsheets (Taken 09/13/2024 1231) Pt will Ambulate:  15 feet  with minimal assist  with rolling walker   Lacinda Fass, PT, DPT

## 2024-09-13 NOTE — Progress Notes (Signed)
   09/13/24 0043  BiPAP/CPAP/SIPAP  $ Non-Invasive Ventilator  Non-Invasive Vent Set Up;Non-Invasive Vent Initial  $ Face Mask Small Yes  BiPAP/CPAP/SIPAP Pt Type Adult  BiPAP/CPAP/SIPAP DREAMSTATIOND  Mask Type Nasal mask  Dentures removed? Not applicable  Mask Size Small  EPAP 4 cmH2O  FiO2 (%) 21 %  Patient Home Machine No  Patient Home Mask No  Patient Home Tubing No  Auto Titrate No  BiPAP/CPAP /SiPAP Vitals  Pulse Rate (!) 46  Resp 12  SpO2 92 %  MEWS Score/Color  MEWS Score 2  MEWS Score Color Yellow

## 2024-09-13 NOTE — ED Notes (Signed)
 Ambulated pt to bedside commode. Pt was unsteady on the transition back.

## 2024-09-13 NOTE — Progress Notes (Signed)
 NAME: Hannah Schultz  DOB: November 03, 1961 Date: 09/13/2024  MUST ID: [id number]  To Whom It May Concern:   Please be advised that the above name patient will require a short-term nursing home stay- anticipated 30 days or less rehabilitation and strengthening. The plan is for return home.

## 2024-09-13 NOTE — NC FL2 (Signed)
 [place]  MEDICAID FL2 LEVEL OF CARE FORM     IDENTIFICATION  Patient Name: Hannah Schultz Birthdate: 19-Feb-1961 Sex: female Admission Date (Current Location): 09/12/2024  [facility] and [place] Number:  [employer] and Address:  [facility],  [address], [place] [address]      Provider Number: [id number]  Attending Physician Name and Address:  Simon Lavonia SAILOR, MD  Relative Name and Phone Number:       Current Level of Care: Hospital Recommended Level of Care: Skilled Nursing Facility Prior Approval Number:    Date Approved/Denied:   PASRR Number:    Discharge Plan: SNF    Current Diagnoses: Patient Active Problem List   Diagnosis Date Noted   Lumbar disc disease with radiculopathy 07/11/2023   Opioid use, unspecified with unspecified opioid-induced disorder (HCC) 07/08/2023   Pulmonary HTN (HCC) 05/30/2021   Atypical chest pain 12/22/2020   Elevated d-dimer 12/22/2020   Acute kidney injury superimposed on CKD 12/22/2020   Hypoglycemia 12/22/2020   GERD (gastroesophageal reflux disease) 12/22/2020   Obesity, Class III, BMI 40-49.9 (morbid obesity) (HCC) 12/22/2020   Chronic low back pain with bilateral sciatica 12/22/2020   Swelling of left lower extremity 12/22/2020   OSA (obstructive sleep apnea) 07/11/2020   Nausea and vomiting 12/21/2019   Stage 3 chronic kidney disease (HCC) 12/21/2019   Lumbar spondylosis 10/13/2019   Type 2 diabetes mellitus with diabetic chronic kidney disease (HCC) 09/24/2019   Sarcoidosis 08/18/2019   Morbid obesity (HCC) 08/18/2019   Family history of sarcoidosis 08/18/2019   Vulvar abscess 02/07/2018   Hyperlipidemia 09/19/2013   Essential hypertension 09/19/2013   Dyslipidemia 09/19/2013   Type 2 diabetes mellitus without complication (HCC) 07/20/2013   Trigger thumb of right hand 11/27/2012   CTS (carpal tunnel syndrome) 11/27/2012    Orientation RESPIRATION BLADDER Height & Weight     Self, Time,  Situation, Place  Normal Continent Weight:   Height:     BEHAVIORAL SYMPTOMS/MOOD NEUROLOGICAL BOWEL NUTRITION STATUS      Continent Diet (Regular)  AMBULATORY STATUS COMMUNICATION OF NEEDS Skin   Extensive Assist Verbally Normal                       Personal Care Assistance Level of Assistance  Bathing, Feeding, Dressing Bathing Assistance: Limited assistance Feeding assistance: Independent Dressing Assistance: Limited assistance     Functional Limitations Info  Sight, Hearing, Speech Sight Info: Impaired Hearing Info: Adequate Speech Info: Adequate    SPECIAL CARE FACTORS FREQUENCY  PT (By licensed PT), OT (By licensed OT)     PT Frequency: 5 times weekly OT Frequency: 5 times weekly            Contractures Contractures Info: Not present    Additional Factors Info  Code Status, Allergies Code Status Info: FULL Allergies Info: Adhesive           Current Medications (09/13/2024):  This is the current hospital active medication list Current Facility-Administered Medications  Medication Dose Route Frequency Provider Last Rate Last Admin   cholecalciferol (VITAMIN D3) 25 MCG (1000 UNIT) tablet 5,000 Units  5,000 Units Oral Daily Haviland, Julie, MD   5,000 Units at 09/13/24 1049   diclofenac  Sodium (VOLTAREN ) 1 % topical gel 4 g  4 g Topical QID PRN Haviland, Julie, MD       famotidine  (PEPCID ) tablet 20 mg  20 mg Oral BID Haviland, Julie, MD   20 mg at 09/13/24  1049   FLUoxetine  (PROZAC ) capsule 20 mg  20 mg Oral Daily Haviland, Julie, MD   20 mg at 09/13/24 1049   gabapentin (NEURONTIN) capsule 600 mg  600 mg Oral TID Haviland, Julie, MD   600 mg at 09/13/24 1049   HYDROcodone -acetaminophen  (NORCO/VICODIN) 5-325 MG per tablet 1 tablet  1 tablet Oral Q6H PRN Haviland, Julie, MD   1 tablet at 09/13/24 0900   insulin  aspart (novoLOG ) injection 0-20 Units  0-20 Units Subcutaneous TID WC Haviland, Julie, MD   3 Units at 09/13/24 1229   insulin  NPH Human (NOVOLIN  N) injection 50 Units  50 Units Subcutaneous TID AC Haviland, Julie, MD   50 Units at 09/13/24 1230   methocarbamol  (ROBAXIN ) tablet 500 mg  500 mg Oral BID Haviland, Julie, MD   500 mg at 09/13/24 1049   Current Outpatient Medications  Medication Sig Dispense Refill   acetaminophen  (TYLENOL ) 500 MG tablet Take 1,000 mg by mouth every 6 (six) hours as needed for moderate pain.     Cholecalciferol (VITAMIN D3) 5000 UNITS TABS Take 5,000 Units by mouth daily.      diclofenac  Sodium (VOLTAREN ) 1 % GEL Apply 4 g topically 4 (four) times daily. 100 g 0   famotidine  (PEPCID ) 20 MG tablet Take 20 mg by mouth 2 (two) times daily.     FLUoxetine  (PROZAC ) 20 MG capsule Take 1 capsule (20 mg total) by mouth daily. 30 capsule 3   gabapentin (NEURONTIN) 600 MG tablet Take 600 mg by mouth 3 (three) times daily.     glucose blood (ONE TOUCH ULTRA TEST) test strip 3 times per day 100 each 0   HYDROcodone -acetaminophen  (NORCO/VICODIN) 5-325 MG tablet Take 1 tablet by mouth every 6 (six) hours as needed for severe pain. 10 tablet 0   insulin  regular (NOVOLIN R,HUMULIN  R) 100 units/mL injection Inject 10-20 Units into the skin 3 (three) times daily before meals. Based on sliding scale     methocarbamol  (ROBAXIN ) 500 MG tablet Take 1 tablet (500 mg total) by mouth 2 (two) times daily. 10 tablet 0   methylPREDNISolone  (MEDROL  DOSEPAK) 4 MG TBPK tablet Day 1: 8mg  before breakfast, 4 mg after lunch, 4 mg after supper, and 8 mg at bedtime Day 2: 4 mg before breakfast, 4 mg after lunch, 4 mg  after supper, and 8 mg  at bedtime Day 3:  4 mg  before breakfast, 4 mg  after lunch, 4 mg after supper, and 4 mg  at bedtime Day 4: 4 mg  before breakfast, 4 mg  after lunch, and 4 mg at bedtime Day 5: 4 mg  before breakfast and 4 mg at bedtime Day 6: 4 mg  before breakfast 1 each 0   metoprolol  succinate (TOPROL  XL) 50 MG 24 hr tablet Take 1 tablet (50 mg total) by mouth daily. Take with or immediately following a meal. 30 tablet 11    NOVOLIN N RELION 100 UNIT/ML injection Inject 50-70 Units into the skin 3 (three) times daily before meals. 60units breakfast and40 units lunch, 10units at bedtime     pantoprazole  (PROTONIX ) 40 MG tablet Take 1 tablet (40 mg total) by mouth daily. 30 tablet 3   PROAIR  HFA 108 (90 Base) MCG/ACT inhaler Inhale 1-2 puffs into the lungs every 6 (six) hours as needed for wheezing or shortness of breath. 18 g 4   simvastatin  (ZOCOR ) 40 MG tablet Take 1 tablet (40 mg total) by mouth every evening. 30 tablet 5  Discharge Medications: Please see discharge summary for a list of discharge medications.  Relevant Imaging Results:  Relevant Lab Results:   Additional Information SSN: [id number], [place]

## 2024-09-14 LAB — CBG MONITORING, ED
Glucose-Capillary: 69 mg/dL — ABNORMAL LOW (ref 70–99)
Glucose-Capillary: 110 mg/dL — ABNORMAL HIGH (ref 70–99)
Glucose-Capillary: 90 mg/dL (ref 70–99)
Glucose-Capillary: 153 mg/dL — ABNORMAL HIGH (ref 70–99)
Glucose-Capillary: 139 mg/dL — ABNORMAL HIGH (ref 70–99)

## 2024-09-14 MED ORDER — INSULIN NPH (HUMAN) (ISOPHANE) 100 UNIT/ML ~~LOC~~ SUSP: 10.0000 [IU] | Freq: Every day | SUBCUTANEOUS | Status: DC

## 2024-09-14 MED ORDER — INSULIN NPH (HUMAN) (ISOPHANE) 100 UNIT/ML ~~LOC~~ SUSP: 50.0000 [IU] | Freq: Every day | SUBCUTANEOUS | Status: DC

## 2024-09-14 MED ORDER — INSULIN NPH (HUMAN) (ISOPHANE) 100 UNIT/ML ~~LOC~~ SUSP: 20.0000 [IU] | Freq: Every day | SUBCUTANEOUS | Status: DC

## 2024-09-14 NOTE — Discharge Instructions (Signed)
 Hannah Schultz  Thank you for allowing us  to take care of you today.  You came to the Emergency Department today because you had a fall.  Here in the emergency department your trauma workup was negative, but you are unable to walk so PT saw you, and feel that you are appropriate to go to a skilled nursing facility.  To-Do: 1. Please follow-up with your primary doctor within 1 to 2 weeks/ as soon as possible.   Please return to the Emergency Department or call 911 if you experience have worsening of your symptoms, or do not get better, chest pain, shortness of breath, severe or significantly worsening pain, high fever, severe confusion, pass out or have any reason to think that you need emergency medical care.   We hope you feel better soon.   Mitzie Later, MD Department of Emergency Medicine [facility] [place]

## 2024-09-14 NOTE — ED Notes (Signed)
 Per Dr. Rogelia, hold 50 units Novolin dose for now. Discussed current sugar of 153 and ordered to just give sliding scale for now.

## 2024-09-14 NOTE — ED Notes (Signed)
 CSW stated we're waiting on insurance to be approved as of now. MD aware.

## 2024-09-14 NOTE — ED Provider Notes (Signed)
 Emergency Medicine Observation Re-evaluation Note  Hannah Schultz is a 63 y.o. female, seen on rounds today.  Pt initially presented to the ED for complaints of Fall and Weakness Currently, the patient is sleeping quietly.  Physical Exam  BP (!) 102/55   Pulse (!) 56   Temp 98 F (36.7 C) (Oral)   Resp 15   SpO2 95%  Physical Exam General: NAD Lungs: Normal effort Psych: Currently calm  ED Course / MDM  EKG:EKG Interpretation Date/Time:  Saturday September 12 2024 19:33:39 EDT Ventricular Rate:  46 PR Interval:  154 QRS Duration:  88 QT Interval:  461 QTC Calculation: 404 R Axis:   5  Text Interpretation: Sinus bradycardia Since last tracing rate slower Confirmed by Dean Clarity [phone]) on 09/12/2024 9:25:26 PM  I have reviewed the labs performed to date as well as medications administered while in observation.  Recent changes in the last 24 hours include no new results or interventions.  Was seen by PT/OT and was recommended for further intervention 5X/week.  Plan  Current plan is for SNF placement, awaiting further TOC recommendations.    Rogelia Jerilynn RAMAN, MD 09/14/24 [phone]

## 2024-09-14 NOTE — ED Notes (Signed)
 Pt repositioned in bed.

## 2024-09-14 NOTE — Inpatient Diabetes Management (Signed)
 Inpatient Diabetes Program Recommendations  AACE/ADA: New Consensus Statement on Inpatient Glycemic Control (2015)  Target Ranges:  Prepandial:   less than 140 mg/dL      Peak postprandial:   less than 180 mg/dL (1-2 hours)      Critically ill patients:  140 - 180 mg/dL   Lab Results  Component Value Date   GLUCAP 153 (H) 09/14/2024   HGBA1C 7.8 (H) 09/12/2024    Review of Glycemic Control  Diabetes history: DM2 Outpatient Diabetes medications: Novolin N 50-20-10 TID Current orders for Inpatient glycemic control: Novolin N 50 TID, Novolog  20 units TID  CBGs 110, 90, 153 mg/dL today [id number] - 2.1%   Inpatient Diabetes Program Recommendations:   May need to reduce Novolin to 40 units TID  Continue to follow.  Thank you. Shona Brandy, RD, LDN, CDCES Inpatient Diabetes Coordinator [phone]

## 2024-09-14 NOTE — ED Notes (Signed)
 Pt given juice

## 2024-09-14 NOTE — ED Notes (Addendum)
 CSW spoke with patient about bed offers. The only offer as of now is [facility]. Patient refers [facility] and if [facility], [facility], or [facility] cannot offer her a bed, she will go home with [facility]. CSW is awaiting on response from the three facilities above before next steps.  Addendum 2:06 pm  [facility] was able to offer [facility] a bed. CSW spoke with patient at bedside about bed offer, which she accepted. Insurance shara has been started- pending approve for DC. CSW will continue to follow.    Addendum 3:32 pm   Patient insurance auth came back approved. [facility] was able to accept today. Room and report number was given to nurse. CSW signing off.

## 2024-09-14 NOTE — ED Provider Notes (Addendum)
 [place] [facility] AT [facility] Provider Note   CSN: [id number] Arrival date & time: 09/12/24  8078     Patient presents with: Fall and Weakness   Hannah Schultz is a 63 y.o. female.  Per initial ED note, history:  Pt is a 63 yo female with pmhx significant for htn, dm, depression, ckd, ptsd, gerd, hld, chronic lbp, and arthritis.  Pt said she received injections into her back on Thursday, 10/2.  Pt said her back has been hurting more and she's been feeling weak.  She fell today and landed on her right knee.  She has more pain in her back and in her right knee.  She is bradycardic with HR in the 40s.  Pt said that is not normal for her.  Per initial ED note, MDM:  EKG: EKG Interpretation Date/Time:                  Saturday September 12 2024 19:33:39 EDT Ventricular Rate:         46 PR Interval:                 154 QRS Duration:             88 QT Interval:                 461 QTC Calculation:404 R Axis:                         5   Text Interpretation:Sinus bradycardia Since last tracing rate slower Confirmed by Dean Clarity [phone]) on 09/12/2024 9:25:26 PM   Radiology:  Imaging Results (Last 48 hours)  DG Lumbar Spine Complete Result Date: 09/12/2024 CLINICAL DATA:  Low back pain.  Pain radiates down the legs. EXAM: LUMBAR SPINE - COMPLETE 4+ VIEW COMPARISON:  Radiograph 08/21/2024 FINDINGS: Five non-rib-bearing lumbar vertebra. Normal lumbar alignment. Normal vertebral body heights. No fracture. No visible pars defects. Stable multilevel anterior spurring. Stable L4-L5 and L5-S1 facet hypertrophy. Sacroiliac joints are congruent. IMPRESSION: Stable multilevel degenerative change in the lumbar spine. No acute findings. Electronically Signed   By: Andrea Gasman M.D.   On: 09/12/2024 21:09    DG Knee Complete 4 Views Right Result Date: 09/12/2024 CLINICAL DATA:  Injury.  Weakness, unwitnessed fall. EXAM: RIGHT KNEE - COMPLETE 4+ VIEW COMPARISON:   None Available. FINDINGS: No acute fracture or dislocation. Tricompartmental peripheral spurring and mild diffuse joint space narrowing. No significant joint effusion. No erosive change or focal bone abnormality. Mild soft tissue edema. IMPRESSION: 1. No acute fracture or dislocation. 2. Mild tricompartmental osteoarthritis. Electronically Signed   By: Andrea Gasman M.D.   On: 09/12/2024 20:31         Procedures    Medications Ordered in the ED  Vitamin D3 TABS 5,000 Units (has no administration in time range)  diclofenac  Sodium (VOLTAREN ) 1 % topical gel 4 g (has no administration in time range)  famotidine  (PEPCID ) tablet 20 mg (has no administration in time range)  FLUoxetine  (PROZAC ) capsule 20 mg (has no administration in time range)  gabapentin (NEURONTIN) tablet 600 mg (has no administration in time range)  HYDROcodone -acetaminophen  (NORCO/VICODIN) 5-325 MG per tablet 1 tablet (has no administration in time range)  methocarbamol  (ROBAXIN ) tablet 500 mg (has no administration in time range)  insulin  NPH Human (NOVOLIN N) injection 50-70 Units (has no administration in time range)  insulin  aspart (novoLOG ) injection 0-20 Units (has no administration  in time range)  morphine  (PF) 4 MG/ML injection 4 mg (4 mg Intravenous Given 09/12/24 2032)  ondansetron  (ZOFRAN ) injection 4 mg (4 mg Intravenous Given 09/12/24 2032)  sodium chloride  0.9 % bolus 1,000 mL (1,000 mLs Intravenous New Bag/Given 09/12/24 2032)  HYDROmorphone  (DILAUDID ) injection 1 mg (1 mg Intravenous Given 09/12/24 2125)                                    Medical Decision Making Amount and/or Complexity of Data Reviewed Labs: ordered. Radiology: ordered.  Risk OTC drugs. Prescription drug management.    This patient presents to the ED for concern of fall with back and knee pain, this involves an extensive number of treatment options, and is a complaint that carries with it a high risk of complications and morbidity.   The differential diagnosis includes fx, strain     Co morbidities that complicate the patient evaluation   htn, dm, depression, ckd, ptsd, gerd, hld, chronic lbp, and arthritis     Additional history obtained:   Additional history obtained from epic chart review External records from outside source obtained and reviewed including EMS report/family     Lab Tests:   I Ordered, and personally interpreted labs.  The pertinent results include:  cbc nl, cmp with cr 1.82 (stable)     Imaging Studies ordered:   I ordered imaging studies including lumbar and r knee xr  I independently visualized and interpreted imaging which showed  Lumbar: Stable multilevel degenerative change in the lumbar spine. No acute  findings.  R knee: No acute fracture or dislocation.  2. Mild tricompartmental osteoarthritis.    I agree with the radiologist interpretation     Cardiac Monitoring:   The patient was maintained on a cardiac monitor.  I personally viewed and interpreted the cardiac monitored which showed an underlying rhythm of: sb     Medicines ordered and prescription drug management:   I ordered medication including morphine /dilaudid   for pain  Reevaluation of the patient after these medicines showed that the patient improved I have reviewed the patients home medicines and have made adjustments as needed     Consultations Obtained:   I requested consultation with TOC/PT,  and discussed lab and imaging findings as well as pertinent plan -pending     Problem List / ED Course:   Fall with acute on chronic back pain.  Pain is still severe.  Pt unable to get up and walk.  She is in agreement to get a pt eval and go to rehab if possible. Bradycardia:  on metoprolol .  I am going to hold her metoprolol  for now.     Reevaluation:   After the interventions noted above, I reevaluated the patient and found that they have :improved     Social Determinants of Health:   Lives at home      Dispostion:   After consideration of the diagnostic results and the patients response to treatment, I feel that the patent would benefit from rehab.      Prior to Admission medications   Medication Sig Start Date End Date Taking? Authorizing Provider  acetaminophen  (TYLENOL ) 500 MG tablet Take 1,000 mg by mouth every 6 (six) hours as needed for moderate pain.   Yes [provider]  chlorthalidone (HYGROTON) 25 MG tablet Take 12.5 mg by mouth every morning. 08/14/24  Yes [provider]  Cholecalciferol (VITAMIN D3)  5000 UNITS TABS Take 5,000 Units by mouth daily.    Yes [provider]  FLUoxetine  (PROZAC ) 20 MG capsule Take 1 capsule (20 mg total) by mouth daily. 12/23/20  Yes Emokpae, Courage, MD  fluticasone-salmeterol (ADVAIR) 250-50 MCG/ACT AEPB Inhale 1 puff into the lungs 2 (two) times daily.   Yes [provider]  gabapentin (NEURONTIN) 600 MG tablet Take 600 mg by mouth 3 (three) times daily. 03/08/20  Yes [provider]  isosorbide mononitrate (IMDUR) 30 MG 24 hr tablet Take 30 mg by mouth daily. 08/28/24  Yes [provider]  lisinopril (ZESTRIL) 20 MG tablet Take 20 mg by mouth daily.   Yes [provider]  metoprolol  succinate (TOPROL -XL) 25 MG 24 hr tablet Take 25 mg by mouth daily.   Yes [provider]  NOVOLIN N RELION 100 UNIT/ML injection Inject 10-50 Units into the skin 3 (three) times daily before meals. Take 50 units in the morning, 20 units midday, and 10 untis at bedtime 12/13/16  Yes [provider]  PROAIR  HFA 108 (90 Base) MCG/ACT inhaler Inhale 1-2 puffs into the lungs every 6 (six) hours as needed for wheezing or shortness of breath. 12/23/20  Yes Emokpae, Courage, MD  rosuvastatin (CRESTOR) 20 MG tablet Take 20 mg by mouth daily. 09/09/24  Yes [provider]  diclofenac  Sodium (VOLTAREN ) 1 % GEL Apply 4 g topically 4 (four) times daily. 05/29/24   Emil Share, DO    Allergies:  Adhesive [tape], Oxycodone , Semaglutide, and Tirzepatide    Review of Systems  Neurological:  Positive for weakness.    Updated Vital Signs BP 118/60   Pulse 63   Temp 98 F (36.7 C) (Oral)   Resp 20   SpO2 94%   Physical Exam Vitals and nursing note reviewed.  Constitutional:      General: She is not in acute distress.    Appearance: Normal appearance.  HENT:     Head: Normocephalic and atraumatic.  Pulmonary:     Effort: Pulmonary effort is normal.  Skin:    General: Skin is dry.  Neurological:     Mental Status: She is alert.     (all labs ordered are listed, but only abnormal results are displayed) Labs Reviewed  CBC WITH DIFFERENTIAL/PLATELET - Abnormal; Notable for the following components:      Result Value   MCH 25.6 (*)    All other components within normal limits  COMPREHENSIVE METABOLIC PANEL WITH GFR - Abnormal; Notable for the following components:   BUN 26 (*)    Creatinine, Ser 1.82 (*)    GFR, Estimated 31 (*)    All other components within normal limits  HEMOGLOBIN A1C - Abnormal; Notable for the following components:   Hgb A1c MFr Bld 7.8 (*)    All other components within normal limits  CBG MONITORING, ED - Abnormal; Notable for the following components:   Glucose-Capillary 107 (*)    All other components within normal limits  CBG MONITORING, ED - Abnormal; Notable for the following components:   Glucose-Capillary 112 (*)    All other components within normal limits  CBG MONITORING, ED - Abnormal; Notable for the following components:   Glucose-Capillary 128 (*)    All other components within normal limits  CBG MONITORING, ED - Abnormal; Notable for the following components:   Glucose-Capillary 149 (*)    All other components within normal limits  CBG MONITORING, ED - Abnormal; Notable for the following components:  Glucose-Capillary 173 (*)    All other components within normal limits  CBG MONITORING, ED - Abnormal; Notable for the  following components:   Glucose-Capillary 166 (*)    All other components within normal limits  CBG MONITORING, ED - Abnormal; Notable for the following components:   Glucose-Capillary 69 (*)    All other components within normal limits  CBG MONITORING, ED - Abnormal; Notable for the following components:   Glucose-Capillary 110 (*)    All other components within normal limits  CBG MONITORING, ED - Abnormal; Notable for the following components:   Glucose-Capillary 153 (*)    All other components within normal limits  CBG MONITORING, ED  CBG MONITORING, ED  CBG MONITORING, ED  CBG MONITORING, ED    EKG: EKG Interpretation Date/Time:  Saturday September 12 2024 19:33:39 EDT Ventricular Rate:  46 PR Interval:  154 QRS Duration:  88 QT Interval:  461 QTC Calculation: 404 R Axis:   5  Text Interpretation: Sinus bradycardia Since last tracing rate slower Confirmed by Dean Clarity [phone]) on 09/12/2024 9:25:26 PM  Radiology: CT Knee Right Wo Contrast Result Date: 09/12/2024 CLINICAL DATA:  Knee trauma, occult fracture suspected, xray done EXAM: CT OF THE RIGHT KNEE WITHOUT CONTRAST TECHNIQUE: Multidetector CT imaging of the right knee was performed according to the standard protocol. Multiplanar CT image reconstructions were also generated. RADIATION DOSE REDUCTION: This exam was performed according to the departmental dose-optimization program which includes automated exposure control, adjustment of the mA and/or kV according to patient size and/or use of iterative reconstruction technique. COMPARISON:  Radiograph earlier today FINDINGS: Bones/Joint/Cartilage No acute fracture. Tricompartmental osteoarthritis with joint space narrowing and peripheral osteophytes. Chronic spurring posteriorly in the proximal tibia at the PCL insertion. Small joint effusion but no lipohemarthrosis. Ligaments Suboptimally assessed by CT. Muscles and Tendons Intact quadriceps and patellar tendons.  No  intramuscular hematoma. Soft tissues Chondrocalcinosis.  Mild soft tissue edema anteriorly. IMPRESSION: 1. No acute fracture of the right knee. 2. Tricompartmental osteoarthritis. 3. Small joint effusion but no lipohemarthrosis. Electronically Signed   By: Andrea Gasman M.D.   On: 09/12/2024 22:58   DG Lumbar Spine Complete Result Date: 09/12/2024 CLINICAL DATA:  Low back pain.  Pain radiates down the legs. EXAM: LUMBAR SPINE - COMPLETE 4+ VIEW COMPARISON:  Radiograph 08/21/2024 FINDINGS: Five non-rib-bearing lumbar vertebra. Normal lumbar alignment. Normal vertebral body heights. No fracture. No visible pars defects. Stable multilevel anterior spurring. Stable L4-L5 and L5-S1 facet hypertrophy. Sacroiliac joints are congruent. IMPRESSION: Stable multilevel degenerative change in the lumbar spine. No acute findings. Electronically Signed   By: Andrea Gasman M.D.   On: 09/12/2024 21:09   DG Knee Complete 4 Views Right Result Date: 09/12/2024 CLINICAL DATA:  Injury.  Weakness, unwitnessed fall. EXAM: RIGHT KNEE - COMPLETE 4+ VIEW COMPARISON:  None Available. FINDINGS: No acute fracture or dislocation. Tricompartmental peripheral spurring and mild diffuse joint space narrowing. No significant joint effusion. No erosive change or focal bone abnormality. Mild soft tissue edema. IMPRESSION: 1. No acute fracture or dislocation. 2. Mild tricompartmental osteoarthritis. Electronically Signed   By: Andrea Gasman M.D.   On: 09/12/2024 20:31     Procedures   Medications Ordered in the ED  cholecalciferol (VITAMIN D3) 25 MCG (1000 UNIT) tablet 5,000 Units (5,000 Units Oral Given 09/14/24 0929)  diclofenac  Sodium (VOLTAREN ) 1 % topical gel 4 g (has no administration in time range)  famotidine  (PEPCID ) tablet 20 mg (20 mg Oral Given 09/14/24 0929)  FLUoxetine  (  PROZAC ) capsule 20 mg (20 mg Oral Given 09/14/24 0929)  gabapentin (NEURONTIN) capsule 600 mg (600 mg Oral Given 09/14/24 0930)   HYDROcodone -acetaminophen  (NORCO/VICODIN) 5-325 MG per tablet 1 tablet (1 tablet Oral Given 09/13/24 0900)  methocarbamol  (ROBAXIN ) tablet 500 mg (500 mg Oral Given 09/14/24 0928)  insulin  aspart (novoLOG ) injection 0-20 Units (4 Units Subcutaneous Given 09/14/24 1224)  insulin  NPH Human (NOVOLIN N) injection 50 Units (has no administration in time range)    And  insulin  NPH Human (NOVOLIN N) injection 20 Units (has no administration in time range)    And  insulin  NPH Human (NOVOLIN N) injection 10 Units (has no administration in time range)  morphine  (PF) 4 MG/ML injection 4 mg (4 mg Intravenous Given 09/12/24 2032)  ondansetron  (ZOFRAN ) injection 4 mg (4 mg Intravenous Given 09/12/24 2032)  sodium chloride  0.9 % bolus 1,000 mL (0 mLs Intravenous Stopped 09/12/24 2231)  HYDROmorphone  (DILAUDID ) injection 1 mg (1 mg Intravenous Given 09/12/24 2125)   Medical decision making: TOC and social work consulted.  PT evaluated and feels that patient would benefit from [facility].  [place] 2 form was completed by prior provider.  Patient did have diminished blood sugars while in the emergency department on 10/6, on review of patient's medications noted that her home Novolin was ordered 50 units 3 times daily rather than 50 units with breakfast, 20 units with lunch, and 10 units at bedtime, therefore insulin  order corrected.  Per social work, there is a facility that potentially can accept the patient, and outside facility requested that a summary note be created, thus this note was written.  Patient was accepted to a SNF on 10/6, pending discharge and transportation to SNF at the time of signout.     Final diagnoses:  Bradycardia  Acute on chronic low back pain  Contusion of right knee, initial encounter    ED Discharge Orders     None          Rogelia Jerilynn RAMAN, MD 09/14/24 1404    Rogelia Jerilynn RAMAN, MD 09/14/24 [phone]

## 2024-09-15 ENCOUNTER — Encounter: Payer: Self-pay | Admitting: Adult Health

## 2024-09-15 ENCOUNTER — Non-Acute Institutional Stay (SKILLED_NURSING_FACILITY): Admitting: Adult Health

## 2024-09-15 DIAGNOSIS — E1169 Type 2 diabetes mellitus with other specified complication: Secondary | ICD-10-CM | POA: Diagnosis not present

## 2024-09-15 DIAGNOSIS — M1711 Unilateral primary osteoarthritis, right knee: Secondary | ICD-10-CM | POA: Insufficient documentation

## 2024-09-15 DIAGNOSIS — N1831 Chronic kidney disease, stage 3a: Secondary | ICD-10-CM

## 2024-09-15 DIAGNOSIS — F4310 Post-traumatic stress disorder, unspecified: Secondary | ICD-10-CM | POA: Insufficient documentation

## 2024-09-15 DIAGNOSIS — G4733 Obstructive sleep apnea (adult) (pediatric): Secondary | ICD-10-CM

## 2024-09-15 DIAGNOSIS — I129 Hypertensive chronic kidney disease with stage 1 through stage 4 chronic kidney disease, or unspecified chronic kidney disease: Secondary | ICD-10-CM

## 2024-09-15 DIAGNOSIS — R5381 Other malaise: Secondary | ICD-10-CM | POA: Diagnosis not present

## 2024-09-15 DIAGNOSIS — E1122 Type 2 diabetes mellitus with diabetic chronic kidney disease: Secondary | ICD-10-CM | POA: Diagnosis not present

## 2024-09-15 DIAGNOSIS — E66813 Obesity, class 3: Secondary | ICD-10-CM

## 2024-09-15 DIAGNOSIS — E785 Hyperlipidemia, unspecified: Secondary | ICD-10-CM

## 2024-09-15 DIAGNOSIS — N1832 Chronic kidney disease, stage 3b: Secondary | ICD-10-CM

## 2024-09-15 DIAGNOSIS — D869 Sarcoidosis, unspecified: Secondary | ICD-10-CM

## 2024-09-15 DIAGNOSIS — Z794 Long term (current) use of insulin: Secondary | ICD-10-CM

## 2024-09-15 DIAGNOSIS — M5116 Intervertebral disc disorders with radiculopathy, lumbar region: Secondary | ICD-10-CM

## 2024-09-15 NOTE — Progress Notes (Signed)
 Location:  [facility] Room Number: 132 Place of Service:  SNF (31)   CODE STATUS: full   Allergies  Allergen Reactions   Adhesive [Tape] Other (See Comments)    Adhesive tape tears skin   Oxycodone      hallucinating   Semaglutide     GI issues   Tirzepatide     GI issues    Chief Complaint  Patient presents with   Hospitalization Follow-up    HPI:  She is a 63 year old woman who has had several ED visits with the last one on 09-14-24. Her past medical history includes: type 2 diabetes; hypertension; CKD; hyperlipidemia; ptsd; depression. Is status post back injections on 09-10-24. She has been to the ED due to falls and weakness. She is here for short term rehab with her goal to return back home. She will continue to be followed for her chronic illnesses including:  Type 2 diabetes mellitus with stage 3b chronic kidney disease with long term current use of insulin :  Hyperlipidemia associated with type 2 diabetes mellitus:   Hypertension associated with stage 3a chronic kidney disease due to type 2 diabetes  Past Medical History:  Diagnosis Date   Acid reflux    Arthritis    Bulging lumbar disc    Cataract    Depression    Diabetes mellitus    Diabetic neuropathy (HCC)    High cholesterol    Hypertension    Post traumatic stress disorder (PTSD)    Sleep apnea     Past Surgical History:  Procedure Laterality Date   ABDOMINAL HYSTERECTOMY     BREAST BIOPSY Left 10/22/2022   MM LT BREAST BX W LOC DEV 1ST LESION IMAGE BX SPEC STEREO GUIDE 10/22/2022 [facility]   CHOLECYSTECTOMY     COLONOSCOPY     EXTRACORPOREAL SHOCK WAVE LITHOTRIPSY Right 02/04/2020   Procedure: EXTRACORPOREAL SHOCK WAVE LITHOTRIPSY (ESWL);  Surgeon: Elisabeth Valli BIRCH, MD;  Location: [facility];  Service: Urology;  Laterality: Right;   INCISION AND DRAINAGE ABSCESS N/A 02/06/2018   Procedure: INCISION AND DRAINAGE vulvar ABSCESS;  Surgeon: Latisha Medford, MD;  Location: [facility];  Service: Gynecology;  Laterality: N/A;   MEDIASTINOSCOPY N/A 04/25/2017   Procedure: MEDIASTINOSCOPY;  Surgeon: Kerrin Elspeth BROCKS, MD;  Location: [facility] OR;  Service: Thoracic;  Laterality: N/A;   TONSILLECTOMY  04/02/2012   Procedure: TONSILLECTOMY;  Surgeon: Ana LELON Moccasin, MD;  Location: [facility] OR;  Service: ENT;  Laterality: Bilateral;    Social History   Socioeconomic History   Marital status: Married    Spouse name: Not on file   Number of children: Not on file   Years of education: Not on file   Highest education level: Not on file  Occupational History   Not on file  Tobacco Use   Smoking status: Never   Smokeless tobacco: Never  Vaping Use   Vaping status: Never Used  Substance and Sexual Activity   Alcohol use: No   Drug use: No   Sexual activity: Yes    Birth control/protection: Surgical  Other Topics Concern   Not on file  Social History Narrative   Not on file   Social Drivers of Health   Financial Resource Strain: Not on file  Food Insecurity: Low Risk  (04/03/2024)   Received from [facility]   Hunger Vital Sign    Within the past 12 months, you worried that your food would run out  before you got money to buy more: Never true    Within the past 12 months, the food you bought just didn't last and you didn't have money to get more. : Never true  Transportation Needs: No Transportation Needs (04/03/2024)   Received from [facility]    In the past 12 months, has lack of reliable transportation kept you from medical appointments, meetings, work or from getting things needed for daily living? : No  Physical Activity: Not on file  Stress: Not on file  Social Connections: Not on file  Intimate Partner Violence: Not on file   Family History  Problem Relation Age of Onset   Heart disease Other    Cancer Other    Diabetes Other    Stroke Mother    Heart attack Mother    Heart attack Father    Leukemia Father     Heart attack Sister    Stroke Sister    Kidney cancer Brother    Aneurysm Brother    Diabetes Brother    Diabetes Brother       VITAL SIGNS BP 114/64   Pulse 68   Temp 98 F (36.7 C)   Resp 18   Ht 5' 4 (1.626 m)   Wt 290 lb (131.5 kg)   SpO2 93%   BMI 49.78 kg/m   Outpatient Encounter Medications as of 09/15/2024  Medication Sig   acetaminophen  (TYLENOL ) 325 MG tablet Take 650 mg by mouth every 6 (six) hours as needed for mild pain (pain score 1-3) or moderate pain (pain score 4-6).   chlorthalidone (HYGROTON) 25 MG tablet Take 12.5 mg by mouth every morning.   Cholecalciferol (VITAMIN D3) 5000 UNITS TABS Take 5,000 Units by mouth daily.    diclofenac  Sodium (VOLTAREN ) 1 % GEL Apply 4 g topically 4 (four) times daily.   FLUoxetine  (PROZAC ) 20 MG capsule Take 1 capsule (20 mg total) by mouth daily.   fluticasone-salmeterol (ADVAIR) 250-50 MCG/ACT AEPB Inhale 1 puff into the lungs 2 (two) times daily.   gabapentin (NEURONTIN) 600 MG tablet Take 600 mg by mouth 3 (three) times daily.   isosorbide mononitrate (IMDUR) 30 MG 24 hr tablet Take 30 mg by mouth daily.   lisinopril (ZESTRIL) 20 MG tablet Take 20 mg by mouth daily.   metoprolol  succinate (TOPROL -XL) 25 MG 24 hr tablet Take 25 mg by mouth daily.   NOVOLIN N RELION 100 UNIT/ML injection Inject 10-50 Units into the skin 3 (three) times daily before meals. Take 50 units in the morning, 20 units midday, and 10 untis at bedtime   PROAIR  HFA 108 (90 Base) MCG/ACT inhaler Inhale 1-2 puffs into the lungs every 6 (six) hours as needed for wheezing or shortness of breath.   rosuvastatin (CRESTOR) 20 MG tablet Take 20 mg by mouth daily.   [DISCONTINUED] acetaminophen  (TYLENOL ) 500 MG tablet Take 1,000 mg by mouth every 6 (six) hours as needed for moderate pain.   No facility-administered encounter medications on file as of 09/15/2024.     SIGNIFICANT DIAGNOSTIC EXAMS  LABS:   07-04-24: tsh 1.411; chol 136; ldl 68; trig 127; hdl  47 89-5-74: wbc 4.7; hgb 12.2; hct 82.4 plt 221; glucose 98; bun 26; creat 1.82; k+ 4.7; na++ 141; ca 9.5; gfr 31; protein 6.9 albumin  4.0   Review of Systems  Constitutional:  Negative for malaise/fatigue.  Respiratory:  Negative for cough and shortness of breath.   Cardiovascular:  Negative for chest pain, palpitations  and leg swelling.  Gastrointestinal:  Negative for abdominal pain, constipation and heartburn.  Musculoskeletal:  Negative for back pain, joint pain and myalgias.  Skin: Negative.   Neurological:  Positive for weakness. Negative for dizziness.  Psychiatric/Behavioral:  The patient is not nervous/anxious.     Physical Exam Constitutional:      General: She is not in acute distress.    Appearance: She is well-developed. She is morbidly obese. She is not diaphoretic.  Neck:     Thyroid : No thyromegaly.  Cardiovascular:     Rate and Rhythm: Normal rate and regular rhythm.     Pulses: Normal pulses.     Heart sounds: Normal heart sounds.  Pulmonary:     Effort: Pulmonary effort is normal. No respiratory distress.     Breath sounds: Normal breath sounds.  Chest:     Chest wall: Tenderness present.  Abdominal:     General: Bowel sounds are normal. There is no distension.     Palpations: Abdomen is soft.     Tenderness: There is no abdominal tenderness.  Musculoskeletal:        General: Normal range of motion.     Cervical back: Neck supple.     Right lower leg: No edema.     Left lower leg: No edema.  Lymphadenopathy:     Cervical: No cervical adenopathy.  Skin:    General: Skin is warm and dry.  Neurological:     Mental Status: She is alert and oriented to person, place, and time.  Psychiatric:        Mood and Affect: Mood normal.       ASSESSMENT/ PLAN:  TODAY  Physical deconditioning: will continue therapy as directed to improve upon strength; gait balance adl training. Goal is to return back home.   2. 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 Type 2 diabetes mellitus with stage 3b chronic kidney disease with long term current use of insulin : hgb A1c 7.8; will continue novolin N 50 units in AM and 20 units with lunch; is on statin and ace   4. Hyperlipidemia associated with type 2 diabetes mellitus: ldl 68; will continue crestor 20 mg daily   5. Hypertension associated with stage 3a chronic kidney disease due to type 2 diabetes: b/p 114/64; will continue lisinopril 20 mg daily; chlorthalidone 12.5 mg daily; imdur 30 mg daily toprol  xl 25 mg nightly   6. OSA/sarcoidosis: will continue advair 250/50 twice daily has albuterol  2 puffs every 6 hours as needed  7. Lumbar disc disease with radiculopathy: will continue gabapentin 600 mg three times daily  8. PTSD (post traumatic stress disorder) will continue prozac  20 mg daily   9. Primary osteoarthritis right knee: will continue voltaren  1 gm four times daily    Barnie Seip NP [facility]  call [phone]

## 2024-09-17 ENCOUNTER — Encounter: Payer: Self-pay | Admitting: Internal Medicine

## 2024-09-17 ENCOUNTER — Non-Acute Institutional Stay (SKILLED_NURSING_FACILITY): Admitting: Internal Medicine

## 2024-09-17 DIAGNOSIS — Z794 Long term (current) use of insulin: Secondary | ICD-10-CM

## 2024-09-17 DIAGNOSIS — E1122 Type 2 diabetes mellitus with diabetic chronic kidney disease: Secondary | ICD-10-CM | POA: Diagnosis not present

## 2024-09-17 DIAGNOSIS — G4733 Obstructive sleep apnea (adult) (pediatric): Secondary | ICD-10-CM

## 2024-09-17 DIAGNOSIS — F1199 Opioid use, unspecified with unspecified opioid-induced disorder: Secondary | ICD-10-CM

## 2024-09-17 DIAGNOSIS — G8929 Other chronic pain: Secondary | ICD-10-CM

## 2024-09-17 DIAGNOSIS — M5441 Lumbago with sciatica, right side: Secondary | ICD-10-CM

## 2024-09-17 DIAGNOSIS — M5442 Lumbago with sciatica, left side: Secondary | ICD-10-CM

## 2024-09-17 DIAGNOSIS — N1832 Chronic kidney disease, stage 3b: Secondary | ICD-10-CM

## 2024-09-17 NOTE — Progress Notes (Signed)
 NURSING HOME LOCATION:  [facility] ROOM NUMBER: 132P  CODE STATUS: Full code  PCP: Shawn Lazoff DO  This is a comprehensive admission note to this SNFperformed on this date less than 30 days from date of admission. Included are preadmission medical/surgical history; reconciled medication list; family history; social history and comprehensive review of systems.  Corrections and additions to the records were documented. Comprehensive physical exam was also performed. Additionally a clinical summary was entered for each active diagnosis pertinent to this admission in the Problem List to enhance continuity of care.  HPI: The patient presented to the ED 09/12/2024 with increasing low back pain associated with weakness.  This was associated with a fall in which she landed on her right knee.  The patient has chronic low back pain and received injections on 09/10/2024.  Imaging revealed no evidence of lumbar fracture and stable multilevel degenerative change.  There was no acute fracture or dislocation of the knee.  Mild tricompartmental osteoarthritis was documented.  Pain was treated with morphine /Dilaudid  with subjective improvement. Creatinine was 1.82 and GFR 31 indicating low stage IIIb CKD.  BUN was minimally elevated at 26.  CBC and DIF was normal except for an [facility] of 25.6.  A1c was 7.8%.  In the ED she was noted to be bradycardic with heart rate in the 40s.  While in the ED cardiac monitor was continued; the baseline rhythm was sinus bradycardia.  Metoprolol  was held.  At discharge pulse rate was in the low 60s.  In reference to the bradycardia; the last TSH on record was 1.100 on 05/03/2017. OT/PT consultation was completed and SNF placement for rehab was recommended. Discharge medications included acetaminophen  to 1000 mg every 6 hours as needed.  Upon admission to the SNF for rehab; acetaminophen  was decreased to 650 mg every 6 hours as needed to decrease hepatotoxic risk.   Glucose monitor was continued; glucoses ranged from a low of 106 up to a high of 273 from an average of 184. OT/PT report the patient is nonambulatory with upper body ADLs with CGA and lower body ADLs with moderate assistance.  Transfer and toileting also required moderate assistance.  Past medical and surgical history: Vitamin D deficiency; essential hypertension; dyslipidemia; GERD; history of depression; PTSD; OSA; diabetes with peripheral neuropathy & CKD.  Surgeries and procedures include breast biopsy; abdominal hysterectomy; cholecystectomy; extracorporeal shockwave lithotripsy; colonoscopy; and mediastinoscopy.  Family history: reviewed, significant history of heart attack, stroke, and diabetes.  Social history:nondrinker; nonsmoker  Review of systems: She states that she had bilateral steroid injections into her back.  She does have burning in her feet in addition to the pain. 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 She stated that she was using the CPAP 2-3 times per week as there was some equipment issues described as the air not coming out.  [facility] has been contacted with follow-up after discharge from SNF. She states that glucoses at home range from a low of 118 up to 159 and are rarely greater than 200.  Constitutional: No fever, significant weight change  Eyes: No redness, discharge, pain, vision change ENT/mouth: No nasal congestion, purulent discharge, earache, change in hearing, sore throat  Cardiovascular: No chest pain, palpitations, paroxysmal nocturnal dyspnea, claudication, edema  Respiratory: No cough, sputum production, hemoptysis, DOE  Gastrointestinal: No heartburn, dysphagia, abdominal pain, nausea /vomiting, rectal bleeding, melena, change in bowels Genitourinary: No dysuria, hematuria, pyuria, incontinence,  nocturia Dermatologic: No rash, pruritus, change in appearance of  skin Neurologic: No dizziness, headache, syncope, seizures Psychiatric: No significant insomnia, anorexia Endocrine: No change in hair/skin/nails, excessive thirst, excessive hunger, excessive urination  Hematologic/lymphatic: No significant bruising, lymphadenopathy, abnormal bleeding Allergy/immunology: No itchy/watery eyes, significant sneezing, urticaria, angioedema  Physical exam:  Pertinent or positive findings: Morbid obesity is present.  She is intelligent and very polite & engaged.  The oropharynx is not visualized due to crowding.  Heart sounds are markedly distant.  Breath sounds are decreased.  Abdomen is protuberant.  Dorsalis pedis pulses are stronger than posterior tibial pulses.  DTRs are 1/2+ in upper EXTR and 0+ at the knees.  General appearance: Adequately nourished; no acute distress, increased work of breathing is present.   Lymphatic: No lymphadenopathy about the head, neck, axilla. Eyes: No conjunctival inflammation or lid edema is present. There is no scleral icterus. Ears:  External ear exam shows no significant lesions or deformities.   Nose:  External nasal examination shows no deformity or inflammation. Nasal mucosa are pink and moist without lesions, exudates Oral exam: Lips and gums are healthy appearing. Neck:  No thyromegaly, masses, tenderness noted.    Heart:  No gallop, murmur, click, rub.  Lungs:  without wheezes, rhonchi, rales, rubs. Abdomen: Bowel sounds are normal.  Abdomen is soft and nontender with no organomegaly, hernias, masses. GU: Deferred  Extremities:  No cyanosis, clubbing, edema. Neurologic exam: Balance, Rhomberg, finger to nose testing could not be completed due to clinical state Skin: Warm & dry w/o tenting. No significant lesions or rash.  See clinical summary under each active problem in the Problem List with associated updated therapeutic plan:  Type 2 diabetes mellitus with diabetic chronic kidney disease (HCC) Current creatinine  1.82 & eGFR indicating CKD low Stage 3b. Gabapentin dose will be decreased to 300 mg tid. A1c is 7.8%. Glucoses @ SNF range from 106 up to 273 for average of 184. Read all labels ; avoid foods & drinks with  High Fructose Corn Syrup as #1, 2 , 3 or # 4 on label.Note : dividing the grams of sugar on label by 4 gives teaspoons of sugar content of food or drink. For example a 22 oz Coke has 68 grams of sugar or 17 tsp of sugar. Pathophysiology of insulin  resistance discussed.      OSA (obstructive sleep apnea) Narcotics contraindicated with severe OSA especially in combination with Gabapentin. Risk discussed with her. [facility] to assess CPAP equipment @ discharge.  Chronic low back pain with bilateral sciatica Because of CKD stage IIIb; gabapentin dose will be decreased to 300 mg 3 times daily.  This precludes the use of an agent such as meloxicam or NSAIDs.  Duloxetine 20 mg twice daily will be substituted for the Prozac  as it has shown benefit with neuropathic pain.

## 2024-09-17 NOTE — Patient Instructions (Signed)
 See assessment and plan under each diagnosis in the problem list and acutely for this visit

## 2024-09-17 NOTE — Assessment & Plan Note (Addendum)
 Current creatinine 1.82 & eGFR indicating CKD low Stage 3b. Gabapentin dose will be decreased to 300 mg tid. A1c is 7.8%. Glucoses @ SNF range from 106 up to 273 for average of 184. Read all labels ; avoid foods & drinks with  High Fructose Corn Syrup as #1, 2 , 3 or # 4 on label.Note : dividing the grams of sugar on label by 4 gives teaspoons of sugar content of food or drink. For example a 22 oz Coke has 68 grams of sugar or 17 tsp of sugar. Pathophysiology of insulin  resistance discussed.

## 2024-09-17 NOTE — Assessment & Plan Note (Addendum)
 Because of CKD stage IIIb; gabapentin dose will be decreased to 300 mg 3 times daily.  This precludes the use of an agent such as meloxicam or NSAIDs.  Duloxetine 20 mg twice daily will be substituted for the Prozac  as it has shown benefit with neuropathic pain.

## 2024-09-17 NOTE — Assessment & Plan Note (Addendum)
 Narcotics contraindicated with severe OSA especially in combination with Gabapentin. Risk discussed with her. [facility] to assess CPAP equipment @ discharge.

## 2024-09-23 ENCOUNTER — Non-Acute Institutional Stay (SKILLED_NURSING_FACILITY): Admitting: Adult Health

## 2024-09-23 ENCOUNTER — Ambulatory Visit: Payer: Self-pay | Admitting: Family Medicine

## 2024-09-23 ENCOUNTER — Other Ambulatory Visit: Payer: Self-pay | Admitting: Adult Health

## 2024-09-23 ENCOUNTER — Encounter: Payer: Self-pay | Admitting: Adult Health

## 2024-09-23 DIAGNOSIS — E1122 Type 2 diabetes mellitus with diabetic chronic kidney disease: Secondary | ICD-10-CM | POA: Diagnosis not present

## 2024-09-23 DIAGNOSIS — N1831 Chronic kidney disease, stage 3a: Secondary | ICD-10-CM

## 2024-09-23 DIAGNOSIS — R5381 Other malaise: Secondary | ICD-10-CM | POA: Diagnosis not present

## 2024-09-23 DIAGNOSIS — I129 Hypertensive chronic kidney disease with stage 1 through stage 4 chronic kidney disease, or unspecified chronic kidney disease: Secondary | ICD-10-CM

## 2024-09-23 DIAGNOSIS — G8929 Other chronic pain: Secondary | ICD-10-CM

## 2024-09-23 DIAGNOSIS — M5441 Lumbago with sciatica, right side: Secondary | ICD-10-CM | POA: Diagnosis not present

## 2024-09-23 DIAGNOSIS — M5442 Lumbago with sciatica, left side: Secondary | ICD-10-CM

## 2024-09-23 MED ORDER — DULOXETINE HCL 20 MG PO CPEP: 20.0000 mg | ORAL_CAPSULE | Freq: Two times a day (BID) | ORAL | 0 refills | Status: AC

## 2024-09-23 MED ORDER — FLUTICASONE-SALMETEROL 250-50 MCG/ACT IN AEPB: 1.0000 | INHALATION_SPRAY | Freq: Two times a day (BID) | RESPIRATORY_TRACT | 0 refills | Status: AC

## 2024-09-23 MED ORDER — LISINOPRIL 20 MG PO TABS: 20.0000 mg | ORAL_TABLET | Freq: Every day | ORAL | 0 refills | Status: AC

## 2024-09-23 MED ORDER — CHLORTHALIDONE 25 MG PO TABS: 12.5000 mg | ORAL_TABLET | Freq: Every morning | ORAL | 0 refills | Status: AC

## 2024-09-23 MED ORDER — PROAIR HFA 108 (90 BASE) MCG/ACT IN AERS: 1.0000 | INHALATION_SPRAY | Freq: Four times a day (QID) | RESPIRATORY_TRACT | 0 refills | Status: AC | PRN

## 2024-09-23 MED ORDER — ROSUVASTATIN CALCIUM 20 MG PO TABS: 20.0000 mg | ORAL_TABLET | Freq: Every day | ORAL | 0 refills | Status: AC

## 2024-09-23 MED ORDER — FLUOXETINE HCL 20 MG PO CAPS: 20.0000 mg | ORAL_CAPSULE | Freq: Every day | ORAL | 0 refills | Status: AC

## 2024-09-23 MED ORDER — ISOSORBIDE MONONITRATE ER 30 MG PO TB24: 30.0000 mg | ORAL_TABLET | Freq: Every day | ORAL | 0 refills | Status: AC

## 2024-09-23 MED ORDER — ONDANSETRON 8 MG PO TBDP: 8.0000 mg | ORAL_TABLET | Freq: Four times a day (QID) | ORAL | 0 refills | Status: AC | PRN

## 2024-09-23 MED ORDER — NOVOLIN N RELION 100 UNIT/ML ~~LOC~~ SUSP: 10.0000 [IU] | Freq: Three times a day (TID) | SUBCUTANEOUS | 0 refills | Status: AC

## 2024-09-23 MED ORDER — METOPROLOL SUCCINATE ER 25 MG PO TB24: 25.0000 mg | ORAL_TABLET | Freq: Every day | ORAL | 0 refills | Status: AC

## 2024-09-23 MED ORDER — GABAPENTIN 600 MG PO TABS: 600.0000 mg | ORAL_TABLET | Freq: Three times a day (TID) | ORAL | 0 refills | Status: AC

## 2024-09-23 MED ORDER — DICLOFENAC SODIUM 1 % EX GEL: 4.0000 g | Freq: Four times a day (QID) | CUTANEOUS | 0 refills | Status: AC

## 2024-09-23 NOTE — Telephone Encounter (Signed)
 Copied from CRM #[id number]. Topic: Clinical - Prescription Issue >> Sep 23, 2024 11:46 AM DeAngela L wrote: Reason for CRM: Alan Pharmacist with [facility] calling to ask if they can change the prescription for PROAIR  HFA 108 (90 Base) MCG/ACT inhaler Alan the pharmacist states they dont make name brand proair  and would like to ask if the is could be changed to generic   [id number] - [place], [place] - [address] [place] [address] Phone: [phone] Fax: [phone]

## 2024-09-23 NOTE — Telephone Encounter (Signed)
 This is a SNF resident and all calls should be redirected to call the facility- [facility]

## 2024-09-23 NOTE — Progress Notes (Signed)
 Location:  [facility] Room Number: 132 Place of Service:  SNF (31)   CODE STATUS: full code   Allergies  Allergen Reactions   Adhesive [Tape] Other (See Comments)    Adhesive tape tears skin   Oxycodone      hallucinating   Semaglutide     GI issues   Tirzepatide     GI issues    Chief Complaint  Patient presents with   Discharge Note    HPI:  She is being discharged to home with home health for pt/ot. She will need a front wheel walker. She will need her prescriptions written and will need to follow up with her medical provider. She had been hospitalized for chronic back pain with falls and weakness. She was admitted to this facility for short term rehab. Therapy: ambulate 300 feet with walker; upper and lower body mod I; she is ready for discharge to home.   Past Medical History:  Diagnosis Date   Acid reflux    Arthritis    Bulging lumbar disc    Cataract    Depression    Diabetes mellitus    Diabetic neuropathy (HCC)    High cholesterol    Hypertension    Post traumatic stress disorder (PTSD)    Sleep apnea     Past Surgical History:  Procedure Laterality Date   ABDOMINAL HYSTERECTOMY     BREAST BIOPSY Left 10/22/2022   MM LT BREAST BX W LOC DEV 1ST LESION IMAGE BX SPEC STEREO GUIDE 10/22/2022 [facility]   CHOLECYSTECTOMY     COLONOSCOPY     EXTRACORPOREAL SHOCK WAVE LITHOTRIPSY Right 02/04/2020   Procedure: EXTRACORPOREAL SHOCK WAVE LITHOTRIPSY (ESWL);  Surgeon: Elisabeth Valli BIRCH, MD;  Location: [facility];  Service: Urology;  Laterality: Right;   INCISION AND DRAINAGE ABSCESS N/A 02/06/2018   Procedure: INCISION AND DRAINAGE vulvar ABSCESS;  Surgeon: Latisha Medford, MD;  Location: [facility];  Service: Gynecology;  Laterality: N/A;   MEDIASTINOSCOPY N/A 04/25/2017   Procedure: MEDIASTINOSCOPY;  Surgeon: Kerrin Elspeth BROCKS, MD;  Location: [facility] OR;  Service: Thoracic;  Laterality: N/A;   TONSILLECTOMY  04/02/2012    Procedure: TONSILLECTOMY;  Surgeon: Ana LELON Moccasin, MD;  Location: [facility] OR;  Service: ENT;  Laterality: Bilateral;    Social History   Socioeconomic History   Marital status: Married    Spouse name: Not on file   Number of children: Not on file   Years of education: Not on file   Highest education level: Not on file  Occupational History   Not on file  Tobacco Use   Smoking status: Never   Smokeless tobacco: Never  Vaping Use   Vaping status: Never Used  Substance and Sexual Activity   Alcohol use: No   Drug use: No   Sexual activity: Yes    Birth control/protection: Surgical  Other Topics Concern   Not on file  Social History Narrative   Not on file   Social Drivers of Health   Financial Resource Strain: Not on file  Food Insecurity: Low Risk  (04/03/2024)   Received from [facility]   Hunger Vital Sign    Within the past 12 months, you worried that your food would run out before you got money to buy more: Never true    Within the past 12 months, the food you bought just didn't last and you didn't have money to get more. : Never true  Transportation Needs: No Transportation  Needs (04/03/2024)   Received from [facility]    In the past 12 months, has lack of reliable transportation kept you from medical appointments, meetings, work or from getting things needed for daily living? : No  Physical Activity: Not on file  Stress: Not on file  Social Connections: Not on file  Intimate Partner Violence: Not on file   Family History  Problem Relation Age of Onset   Heart disease Other    Cancer Other    Diabetes Other    Stroke Mother    Heart attack Mother    Heart attack Father    Leukemia Father    Heart attack Sister    Stroke Sister    Kidney cancer Brother    Aneurysm Brother    Diabetes Brother    Diabetes Brother       VITAL SIGNS BP (!) 126/49   Pulse 60   Temp (!) 97.5 F (36.4 C)   Resp 20   Ht 5' 4 (1.626 m)   Wt 288 lb (130.6  kg)   SpO2 98%   BMI 49.44 kg/m   Outpatient Encounter Medications as of 09/23/2024  Medication Sig   ondansetron  (ZOFRAN -ODT) 8 MG disintegrating tablet Take 1 tablet (8 mg total) by mouth every 6 (six) hours as needed for nausea or vomiting.   acetaminophen  (TYLENOL ) 325 MG tablet Take 650 mg by mouth every 6 (six) hours as needed for mild pain (pain score 1-3) or moderate pain (pain score 4-6).   chlorthalidone (HYGROTON) 25 MG tablet Take 0.5 tablets (12.5 mg total) by mouth every morning.   Cholecalciferol (VITAMIN D3) 5000 UNITS TABS Take 5,000 Units by mouth daily.    diclofenac  Sodium (VOLTAREN ) 1 % GEL Apply 4 g topically 4 (four) times daily.   DULoxetine (CYMBALTA) 20 MG capsule Take 1 capsule (20 mg total) by mouth 2 (two) times daily.   FLUoxetine  (PROZAC ) 20 MG capsule Take 1 capsule (20 mg total) by mouth daily.   fluticasone-salmeterol (ADVAIR) 250-50 MCG/ACT AEPB Inhale 1 puff into the lungs 2 (two) times daily.   gabapentin (NEURONTIN) 600 MG tablet Take 1 tablet (600 mg total) by mouth 3 (three) times daily.   isosorbide mononitrate (IMDUR) 30 MG 24 hr tablet Take 1 tablet (30 mg total) by mouth daily.   lisinopril (ZESTRIL) 20 MG tablet Take 1 tablet (20 mg total) by mouth daily.   metoprolol  succinate (TOPROL -XL) 25 MG 24 hr tablet Take 1 tablet (25 mg total) by mouth daily.   NOVOLIN N RELION 100 UNIT/ML injection Inject 0.1-0.5 mLs (10-50 Units total) into the skin 3 (three) times daily before meals. Take 50 units in the morning, 20 units midday, and 10 untis at bedtime   PROAIR  HFA 108 (90 Base) MCG/ACT inhaler Inhale 1-2 puffs into the lungs every 6 (six) hours as needed for wheezing or shortness of breath.   rosuvastatin (CRESTOR) 20 MG tablet Take 1 tablet (20 mg total) by mouth daily.   No facility-administered encounter medications on file as of 09/23/2024.     SIGNIFICANT DIAGNOSTIC EXAMS  LABS:   07-04-24: tsh 1.411; chol 136; ldl 68; trig 127; hdl  47 89-5-74: wbc 4.7; hgb 12.2; hct 82.4 plt 221; glucose 98; bun 26; creat 1.82; k+ 4.7; na++ 141; ca 9.5; gfr 31; protein 6.9 albumin  4.0   Review of Systems  Constitutional:  Negative for malaise/fatigue.  Respiratory:  Negative for cough and shortness of breath.   Cardiovascular:  Negative for chest pain, palpitations and leg swelling.  Gastrointestinal:  Negative for abdominal pain, constipation and heartburn.  Musculoskeletal:  Negative for back pain, joint pain and myalgias.  Skin: Negative.   Neurological:  Negative for dizziness.  Psychiatric/Behavioral:  The patient is not nervous/anxious.     Physical Exam Constitutional:      General: She is not in acute distress.    Appearance: She is well-developed. She is morbidly obese. She is not diaphoretic.  Neck:     Thyroid : No thyromegaly.  Cardiovascular:     Rate and Rhythm: Normal rate and regular rhythm.     Heart sounds: Normal heart sounds.  Pulmonary:     Effort: Pulmonary effort is normal. No respiratory distress.     Breath sounds: Normal breath sounds.  Abdominal:     General: Bowel sounds are normal. There is no distension.     Palpations: Abdomen is soft.     Tenderness: There is no abdominal tenderness.  Musculoskeletal:        General: Normal range of motion.     Cervical back: Neck supple.     Right lower leg: No edema.     Left lower leg: No edema.  Lymphadenopathy:     Cervical: No cervical adenopathy.  Skin:    General: Skin is warm and dry.  Neurological:     Mental Status: She is alert and oriented to person, place, and time.  Psychiatric:        Mood and Affect: Mood normal.       ASSESSMENT/ PLAN:  Patient is being discharged with the following home health services:  pt/ot to evaluate and treat as indicated for gait balance strength adl training.   Patient is being discharged with the following durable medical equipment:  front wheel walker   Patient has been advised to f/u with their  PCP in 1-2 weeks to for a transitions of care visit.  Social services at their facility was responsible for arranging this appointment.  Pt was provided with adequate prescriptions of noncontrolled medications to reach the scheduled appointment .  For controlled substances, a limited supply was provided as appropriate for the individual patient.  If the pt normally receives these medications from a pain clinic or has a contract with another physician, these medications should be received from that clinic or physician only).    A 30 day supply of her prescription medications [facility] [place]  Time spent with patient: 40 minutes: home health; dme; medications.    Barnie Seip NP [facility]  call [phone]

## 2024-10-21 ENCOUNTER — Other Ambulatory Visit: Payer: Self-pay | Admitting: Adult Health

## 2024-10-26 ENCOUNTER — Encounter: Payer: Self-pay | Admitting: Orthopedic Surgery

## 2024-10-26 ENCOUNTER — Ambulatory Visit: Admitting: Orthopedic Surgery

## 2024-10-26 VITALS — BP 114/64 | Ht 64.0 in | Wt 280.0 lb

## 2024-10-26 DIAGNOSIS — M25561 Pain in right knee: Secondary | ICD-10-CM | POA: Diagnosis not present

## 2024-10-26 DIAGNOSIS — W19XXXA Unspecified fall, initial encounter: Secondary | ICD-10-CM

## 2024-10-26 DIAGNOSIS — M1711 Unilateral primary osteoarthritis, right knee: Secondary | ICD-10-CM | POA: Diagnosis not present

## 2024-10-26 NOTE — Progress Notes (Signed)
  Intake history:  Chief Complaint  Patient presents with   Knee Pain    Right      BP 114/64 Comment: 09/15/24  Ht 5' 4 (1.626 m)   Wt 280 lb (127 kg)   BMI 48.06 kg/m  Body mass index is 48.06 kg/m.  Pharmacy? __[facility] 14____________________________________  WHAT ARE WE SEEING YOU FOR TODAY?   Right knee  How long has this bothered you? (DOI?DOS?WS?)  51m.  Was there an injury? Yes/ fell   Anticoag.  No   Any ALLERGIES _________ Allergies  Allergen Reactions   Adhesive [Tape] Other (See Comments)    Adhesive tape tears skin   Oxycodone      hallucinating   Semaglutide     GI issues   Tirzepatide     GI issues   _____________________________________   Treatment:  Have you taken:  Tylenol  Yes  Advil No/ can't take  ( kidney function/ GFR 20-30)  Had PT yes  Had injection No  Other  _________________________

## 2024-10-26 NOTE — Progress Notes (Signed)
 Office Visit Note   Patient: Hannah Schultz           Date of Birth: 1961/07/17           MRN: [id number] Visit Date: 10/26/2024 Requested by: Macarthur Elouise SQUIBB, DO [address] [place],  [place] [address] PCP: Lazoff, Shawn P, DO   Assessment & Plan:   Encounter Diagnoses  Name Primary?   Acute pain of right knee Yes   Primary osteoarthritis of right knee     No orders of the defined types were placed in this encounter.   63 year old female injury right knee had physical therapy still having trouble walking still having pain weakness and giving way  Recommend MRI call patient with results  Physical therapy to continue rehab as an outpatient   Subjective: Chief Complaint  Patient presents with   Knee Pain    Right     HPI: 63 year old female History of diabetes chronic kidney disease COPD high BMI pulmonary hypertension lumbar disc disease presents after falling at home when she became dizzy landed on a flexed knee  She was sent to rehab for 2 weeks where she had some physical therapy.  She is now using a cane more frequently although she did use 1 previously  Complains of a burning sensation inside the knee and giving way.  She cannot take NSAIDs because of her kidney disease             ROS: Noncontributory   Images personally read and my interpretation : Outside imaging shows no fracture but does show some arthritis, CT scan was also done which also shows arthritis no fracture x-rays and CT were done on September 12, 2024  Visit Diagnoses:  1. Acute pain of right knee   2. Primary osteoarthritis of right knee      Follow-Up Instructions: No follow-ups on file.    Objective: Vital Signs: BP 114/64 Comment: 09/15/24  Ht 5' 4 (1.626 m)   Wt 280 lb (127 kg)   BMI 48.06 kg/m   Physical Exam Nursing note reviewed.  Constitutional:      Appearance: Normal appearance.  Skin:    General: Skin is warm.     Capillary Refill: Capillary refill takes less  than 2 seconds.     Findings: No bruising, erythema, lesion or rash.  Neurological:     Mental Status: She is alert and oriented to person, place, and time.     Gait: Gait abnormal.  Psychiatric:        Mood and Affect: Mood normal.        Behavior: Behavior normal.        Thought Content: Thought content normal.        Judgment: Judgment normal.      Right Knee Exam   Comments:  Right knee there is some tenderness over the front of the knee near the tendon of the patella and the tibial tubercle  No joint effusion  Weak but intact extension  Flexion of approximately 120 degrees  AP stability normal no collateral ligament instability       Specialty Comments:  No specialty comments available.  Imaging: No results found.   PMFS History: Patient Active Problem List   Diagnosis Date Noted   Physical deconditioning 09/15/2024   Hyperlipidemia associated with type 2 diabetes mellitus (HCC) 09/15/2024   Hypertension associated with stage 3a chronic kidney disease due to type 2 diabetes mellitus (HCC) 09/15/2024   PTSD (post-traumatic  stress disorder) 09/15/2024   Osteoarthritis of right knee 09/15/2024   Lumbar disc disease with radiculopathy 07/11/2023   Opioid use, unspecified with unspecified opioid-induced disorder (HCC) 07/08/2023   Pulmonary HTN (HCC) 05/30/2021   Atypical chest pain 12/22/2020   Elevated d-dimer 12/22/2020   GERD (gastroesophageal reflux disease) 12/22/2020   Obesity, Class III, BMI 40-49.9 (morbid obesity) (HCC) 12/22/2020   Chronic low back pain with bilateral sciatica 12/22/2020   OSA (obstructive sleep apnea) 07/11/2020   Nausea and vomiting 12/21/2019   Stage 3 chronic kidney disease (HCC) 12/21/2019   Lumbar spondylosis 10/13/2019   Type 2 diabetes mellitus with diabetic chronic kidney disease (HCC) 09/24/2019   Sarcoidosis 08/18/2019   Morbid obesity (HCC) 08/18/2019   Family history of sarcoidosis 08/18/2019   Hyperlipidemia  09/19/2013   Essential hypertension 09/19/2013   Dyslipidemia 09/19/2013   CTS (carpal tunnel syndrome) 11/27/2012   Past Medical History:  Diagnosis Date   Acid reflux    Arthritis    Bulging lumbar disc    Cataract    Depression    Diabetes mellitus    Diabetic neuropathy (HCC)    High cholesterol    Hypertension    Post traumatic stress disorder (PTSD)    Sleep apnea     Family History  Problem Relation Age of Onset   Heart disease Other    Cancer Other    Diabetes Other    Stroke Mother    Heart attack Mother    Heart attack Father    Leukemia Father    Heart attack Sister    Stroke Sister    Kidney cancer Brother    Aneurysm Brother    Diabetes Brother    Diabetes Brother     Past Surgical History:  Procedure Laterality Date   ABDOMINAL HYSTERECTOMY     BREAST BIOPSY Left 10/22/2022   MM LT BREAST BX W LOC DEV 1ST LESION IMAGE BX SPEC STEREO GUIDE 10/22/2022 [facility]   CHOLECYSTECTOMY     COLONOSCOPY     EXTRACORPOREAL SHOCK WAVE LITHOTRIPSY Right 02/04/2020   Procedure: EXTRACORPOREAL SHOCK WAVE LITHOTRIPSY (ESWL);  Surgeon: Elisabeth Valli BIRCH, MD;  Location: [facility];  Service: Urology;  Laterality: Right;   INCISION AND DRAINAGE ABSCESS N/A 02/06/2018   Procedure: INCISION AND DRAINAGE vulvar ABSCESS;  Surgeon: Latisha Medford, MD;  Location: [facility];  Service: Gynecology;  Laterality: N/A;   MEDIASTINOSCOPY N/A 04/25/2017   Procedure: MEDIASTINOSCOPY;  Surgeon: Kerrin Elspeth BROCKS, MD;  Location: [facility] OR;  Service: Thoracic;  Laterality: N/A;   TONSILLECTOMY  04/02/2012   Procedure: TONSILLECTOMY;  Surgeon: Ana LELON Moccasin, MD;  Location: [facility] OR;  Service: ENT;  Laterality: Bilateral;   Social History   Occupational History   Not on file  Tobacco Use   Smoking status: Never   Smokeless tobacco: Never  Vaping Use   Vaping status: Never Used  Substance and Sexual Activity   Alcohol use: No   Drug use: No   Sexual activity: Yes     Birth control/protection: Surgical

## 2024-10-26 NOTE — Patient Instructions (Addendum)
 Physical therapy has been ordered for you at [facility] They should call you to schedule, [phone] is the phone number to call, if you want to call to schedule.   Your insurance does not need approval forMRI please go ahead and call to schedule your appointment with [facility] within at least one (1) week.   Central Scheduling [phone]  You have arthritis of a major joint  The recommended treatment for pain is:  Weight loss   Tylenol  500 mg every 6 hours  Plus or minus an anti-inflammatory such as Advil, Aleve or prescription Celebrex or meloxicam (you can't take these)  You can use topical medication such as Bengay, Aspercreme, Biofreeze, Voltaren  gel  You can apply ice or heat for 20 minutes depending on which one feels better  Oral supplements : chondroitin sulfate and glucosamine can be helpful as well.  Exercise is very important for 15 to 20-minute walk daily will increase your mobility and strength. If you have arthritis of your knee quadricep strengthening exercises will help please follow the instructions on the sheet you were given If you have arthritis of your hip simply walking for exercise will help  Steroid Injections can be helpful for short term pain relief   Opioids such as codeine, hydrocodone , oxycodone  are not indicated for arthritis pain

## 2024-10-31 ENCOUNTER — Ambulatory Visit (HOSPITAL_COMMUNITY)
Admission: RE | Admit: 2024-10-31 | Discharge: 2024-10-31 | Disposition: A | Discharge status: Home / Self Care | Source: Ambulatory Visit | Attending: Orthopedic Surgery | Admitting: Orthopedic Surgery

## 2024-10-31 DIAGNOSIS — M25561 Pain in right knee: Secondary | ICD-10-CM | POA: Diagnosis present

## 2024-11-10 ENCOUNTER — Telehealth: Payer: Self-pay | Admitting: Orthopedic Surgery

## 2024-11-10 NOTE — Telephone Encounter (Signed)
 I told her we have results, he will call her  Hope will you please cancel the appointment ?

## 2024-11-10 NOTE — Telephone Encounter (Signed)
 Dr. Areatha pt - spoke w/the pt, she has an appt 11/19/24 for MRI results.  Per the pt, her spouse is having a very serious surgery on 11/16/24 and she will be w/him that week.  She wants to know if Dr. VEAR will give her the results over the phone.  [phone]

## 2024-11-19 ENCOUNTER — Other Ambulatory Visit: Payer: Self-pay | Admitting: Adult Health

## 2024-11-19 ENCOUNTER — Ambulatory Visit: Admitting: Orthopedic Surgery

## 2024-11-19 ENCOUNTER — Telehealth: Payer: Self-pay | Admitting: Orthopedic Surgery

## 2024-11-19 NOTE — Telephone Encounter (Signed)
 Patient wanted you to call with MRI report, you sent message on 11/10/24 you would call her / [phone]  Can you please call her? She came in today thinking appointment was today but it was cancelled

## 2024-11-19 NOTE — Telephone Encounter (Signed)
 Needs appt for knee injection [facility] knee PT   At her convenience work her in

## 2024-11-27 ENCOUNTER — Telehealth: Payer: Self-pay

## 2024-11-27 NOTE — Telephone Encounter (Signed)
 Return patient's call, she said that she would give us  a call back to schedule an appointment, due to her husband is in the hospital now.

## 2024-12-07 NOTE — Therapy (Signed)
 " OUTPATIENT PHYSICAL THERAPY LOWER EXTREMITY EVALUATION   Patient Name: Hannah Schultz MRN: [id number] DOB:[birth year], 63 y.o., female Today's Date: 12/07/2024  END OF SESSION:   Past Medical History:  Diagnosis Date   Acid reflux    Arthritis    Bulging lumbar disc    Cataract    Depression    Diabetes mellitus    Diabetic neuropathy (HCC)    High cholesterol    Hypertension    Post traumatic stress disorder (PTSD)    Sleep apnea    Past Surgical History:  Procedure Laterality Date   ABDOMINAL HYSTERECTOMY     BREAST BIOPSY Left 10/22/2022   MM LT BREAST BX W LOC DEV 1ST LESION IMAGE BX SPEC STEREO GUIDE 10/22/2022 [facility]   CHOLECYSTECTOMY     COLONOSCOPY     EXTRACORPOREAL SHOCK WAVE LITHOTRIPSY Right 02/04/2020   Procedure: EXTRACORPOREAL SHOCK WAVE LITHOTRIPSY (ESWL);  Surgeon: Elisabeth Valli BIRCH, MD;  Location: [facility];  Service: Urology;  Laterality: Right;   INCISION AND DRAINAGE ABSCESS N/A 02/06/2018   Procedure: INCISION AND DRAINAGE vulvar ABSCESS;  Surgeon: Latisha Medford, MD;  Location: [facility];  Service: Gynecology;  Laterality: N/A;   MEDIASTINOSCOPY N/A 04/25/2017   Procedure: MEDIASTINOSCOPY;  Surgeon: Kerrin Elspeth BROCKS, MD;  Location: [facility] OR;  Service: Thoracic;  Laterality: N/A;   TONSILLECTOMY  04/02/2012   Procedure: TONSILLECTOMY;  Surgeon: Ana LELON Moccasin, MD;  Location: [facility] OR;  Service: ENT;  Laterality: Bilateral;   Patient Active Problem List   Diagnosis Date Noted   Physical deconditioning 09/15/2024   Hyperlipidemia associated with type 2 diabetes mellitus (HCC) 09/15/2024   Hypertension associated with stage 3a chronic kidney disease due to type 2 diabetes mellitus (HCC) 09/15/2024   PTSD (post-traumatic stress disorder) 09/15/2024   Osteoarthritis of right knee 09/15/2024   Lumbar disc disease with radiculopathy 07/11/2023   Opioid use, unspecified with unspecified opioid-induced disorder (HCC) 07/08/2023    Pulmonary HTN (HCC) 05/30/2021   Atypical chest pain 12/22/2020   Elevated d-dimer 12/22/2020   GERD (gastroesophageal reflux disease) 12/22/2020   Obesity, Class III, BMI 40-49.9 (morbid obesity) (HCC) 12/22/2020   Chronic low back pain with bilateral sciatica 12/22/2020   OSA (obstructive sleep apnea) 07/11/2020   Nausea and vomiting 12/21/2019   Stage 3 chronic kidney disease (HCC) 12/21/2019   Lumbar spondylosis 10/13/2019   Type 2 diabetes mellitus with diabetic chronic kidney disease (HCC) 09/24/2019   Sarcoidosis 08/18/2019   Morbid obesity (HCC) 08/18/2019   Family history of sarcoidosis 08/18/2019   Hyperlipidemia 09/19/2013   Essential hypertension 09/19/2013   Dyslipidemia 09/19/2013   CTS (carpal tunnel syndrome) 11/27/2012    PCP: Macarthur Elouise SQUIBB, DO   REFERRING PROVIDER: Margrette Taft BRAVO, MD  REFERRING DIAG: [phone] (ICD-10-CM) - Chronic pain of right knee  THERAPY DIAG:  No diagnosis found.  Rationale for Evaluation and Treatment: Rehabilitation  ONSET DATE: Fall on Right knee in November  SUBJECTIVE:   SUBJECTIVE STATEMENT: Pt states she had a fall in November and landed on the right knee, went to the hospital and then to [facility]. Pt states she feels she is losing her balance and has been using a cane since returning home from the hospital in November. Pt states the right knee just gives out when she is walking. Pt has not had any falls since fall in November.  PERTINENT HISTORY: Diabetics HTN, controled with medication Back trouble, right side PAIN:  Are you having pain? Yes:  NPRS scale: 4/10 this date, worst in last week, 10/10 at night Pain location: medial and lateral right knee Pain description: stabbing pain Aggravating factors: walking, squatting and bending Relieving factors: ice, sitting  PRECAUTIONS: None  RED FLAGS: None   WEIGHT BEARING RESTRICTIONS: No  FALLS:  Has patient fallen in last 6 months? Yes. Number of falls  1  LIVING ENVIRONMENT: Lives with: lives with their spouse Lives in: House/apartment Stairs: Yes: External: 1 steps; none Has following equipment at home: Single point cane and Walker - 2 wheeled  OCCUPATION: retired, neurosurgeon  PLOF: Independent and Independent with basic ADLs  PATIENT GOALS: decrease right knee pain, improve balance, and get back into gym (over 6 months ago, 24/7 fitness)  NEXT MD VISIT: none at this time  OBJECTIVE:  Note: Objective measures were completed at Evaluation unless otherwise noted.  DIAGNOSTIC FINDINGS: CLINICAL DATA:  Knee pain since falling 3 weeks ago. Meniscal injury.   EXAM: MRI OF THE RIGHT KNEE WITHOUT CONTRAST   TECHNIQUE: Multiplanar, multisequence MR imaging of the knee was performed. No intravenous contrast was administered.   COMPARISON:  Radiographs and CT of the right knee 09/12/2024.   FINDINGS: MENISCI   Medial meniscus:  Intact with normal morphology.   Lateral meniscus:  Intact with normal morphology.   LIGAMENTS   Cruciates: The anterior and posterior cruciate ligaments are intact.   Collaterals: Partial tear of the medial collateral ligament proximally with mild surrounding soft tissue edema. No ligament retraction identified. The components of the lateral collateral ligament complex appear intact.   CARTILAGE   Patellofemoral: Patellofemoral chondral thinning, surface irregularity and osteophyte formation. Prominent subcortical cyst formation laterally in the femoral trochlea.   Medial: Moderate chondral thinning, surface irregularity and peripheral osteophyte formation. There is a prominent osteophyte projecting proximally from the posterolateral aspect of the medial tibial plateau.   Lateral: Mild chondral thinning with peripheral osteophyte formation.   MISCELLANEOUS   Joint: Moderate-sized joint effusion. No intra-articular loose bodies identified.   Popliteal Fossa: The popliteus muscle  and tendon are intact. No significant Baker's cyst.   Extensor Mechanism: The visualized quadriceps and patellar tendons are intact.The patellar retinacula and medial patellofemoral ligament are intact.   Bones:  No acute or significant extra-articular osseous findings.   Other: Mild prepatellar subcutaneous edema. No evidence of focal fluid collection.   IMPRESSION: 1. Partial tear of the medial collateral ligament proximally. 2. The menisci, cruciate and lateral collateral ligaments are intact. 3. Moderate tricompartmental degenerative changes. No acute osseous findings. 4. Moderate-sized joint effusion.  PATIENT SURVEYS:  LEFS : 35 / 80 = 43.8 %   SENSATION: Light touch: Impaired , pt is on gabapentin  for burning of hands and feet  EDEMA:  Pt reports swelling has decreased since the fall.   PALPATION: Tenderness noted on R knee medial(worse) and lateral joint lines and medial and lateral poles of R patella  LOWER EXTREMITY ROM:  Active ROM Right eval Left eval  Hip flexion    Hip extension    Hip abduction    Hip adduction    Hip internal rotation    Hip external rotation    Knee flexion 102 AROM 110 PROM  painful 118  Knee extension 11 from neutral in sitting, pain 11 from neutral in sitting  Ankle dorsiflexion    Ankle plantarflexion    Ankle inversion    Ankle eversion     (Blank rows = not tested)  LOWER EXTREMITY MMT:  MMT Right  eval Left eval  Hip flexion 3 3+  Hip extension    Hip abduction 3+ 3+  Hip adduction 3+ 3+  Hip internal rotation    Hip external rotation    Knee flexion 3+ 5  Knee extension 3+ 5  Ankle dorsiflexion 4 4  Ankle plantarflexion    Ankle inversion    Ankle eversion     (Blank rows = not tested)  LOWER EXTREMITY SPECIAL TESTS:  Knee special tests: varus and valgus test, positive for MCL pain  FUNCTIONAL TESTS:  5 times sit to stand: 21.99 seconds, no increase in right knee pain 2 minute walk test: 238 feet,  no increased in right knee pian, SPC used in left SLS 12/09/24: R: 5.75 s, slight pain in right knee L: 6.3 s   GAIT: Distance walked: 300 feet Assistive device utilized: Single point cane Level of assistance: Modified independence Comments: pt demonstrates                                                                                                                                 TREATMENT DATE:  12/07/2024   Evaluation: -ROM measured, Strength assessed, HEP prescribed, pt educated on prognosis, findings, and importance of HEP compliance if given.         PATIENT EDUCATION:  Education details: Pt was educated on findings of PT evaluation, prognosis, frequency of therapy visits and rationale, attendance policy, and HEP if given.   Person educated: Patient Education method: Explanation, Verbal cues, and Handouts Education comprehension: verbalized understanding and needs further education  HOME EXERCISE PROGRAM: Access Code: [email or web address] URL: [email or web address] Date: 12/09/2024 Prepared by: Lang Ada  Exercises - Seated Long Arc Quad  - 1 x daily - 7 x weekly - 3 sets - 10 reps - Sit to Stand  - 1 x daily - 7 x weekly - 3 sets - 10 reps  ASSESSMENT:  CLINICAL IMPRESSION: Patient is a 63 y.o. female who was seen today for physical therapy evaluation and treatment for M25.561,G89.29 (ICD-10-CM) - Chronic pain of right knee.   Patient demonstrates increased right knee pain, decreased RLE strength, abnormal pain rating with functional mobility, and impaired balance. Patient also demonstrates difficulty with ambulation during today's session with SPC decreased stride length and velocity noted. Pt educated on LUE recommendation for maximal offloading of R knee during ambulation. Patient also demonstrates tenderness on medial and lateral joint lines of R knee, will consider manual therapy for improved ROM and pain management. Patient requires education on POC,  HEP recommendations and importance of compliance. Patient would benefit from skilled physical therapy for decreased pain in R knee, increased endurance with ambulation, increased RLE strength/ROM, and balance for improved gait quality, return to higher level of function with ADLs, and progress towards therapy goals.   OBJECTIVE IMPAIRMENTS: Abnormal gait, decreased activity tolerance, decreased balance, decreased endurance, decreased knowledge of condition, decreased knowledge of use of DME, decreased mobility, difficulty  walking, decreased ROM, decreased strength, hypomobility, and pain.   ACTIVITY LIMITATIONS: carrying, lifting, bending, sitting, standing, squatting, stairs, transfers, and bed mobility  PARTICIPATION LIMITATIONS: meal prep, cleaning, laundry, driving, shopping, community activity, and yard work  PERSONAL FACTORS: Age, Fitness, Past/current experiences, Time since onset of injury/illness/exacerbation, and 1 comorbidity: history of chronic right knee pain are also affecting patient's functional outcome.   REHAB POTENTIAL: Fair chronic in nature  CLINICAL DECISION MAKING: Stable/uncomplicated  EVALUATION COMPLEXITY: Low   GOALS: Goals reviewed with patient? No  SHORT TERM GOALS: Target date: 12/30/24  Patient will demonstrate evidence of independence with individualized HEP and will report compliance for at least 3 days per week for optimized progression towards remaining therapy goals. Baseline:  Goal status: INITIAL  2.  Patient will report a decrease in pain level during community ambulation by at least 2 points for improved quality of life. Baseline: 4/10 Goal status: INITIAL     LONG TERM GOALS: Target date: 01/20/25  Pt will demonstrate a an increase of at least 9 points on the LEFS for improved performance of community ambulation and ADL. Baseline: see objective Goal status: INITIAL  2.  Pt will improve 2 MWT by 40 feet with LRAD in order to demonstrate  improved functional ambulatory capacity in community setting.  Baseline: see objective Goal status: INITIAL  3.  Pt will demonstrate increase of 7 degrees in ROM (flexion and extension) in right knee, for increased mobility and maximal efficiency of gait cycle during ambulation. Baseline: see objective Goal status: INITIAL  4.  Pt will demonstrate at least 4+/5 MMT for right lower extremity for increased strength during ADL and community ambulation. Baseline: see objective Goal status: INITIAL  5.  Pt will improve SLS by 7 seconds bilaterally in order to improve balance during functional activities. Baseline: see objective Goal status: INITIAL    PLAN:  PT FREQUENCY: 2x/week  PT DURATION: 6 weeks  PLANNED INTERVENTIONS: 97110-Therapeutic exercises, 97530- Therapeutic activity, 97112- Neuromuscular re-education, 97535- Self Care, 02859- Manual therapy, [phone]- Gait training, [phone] (1-2 muscles), 20561 (3+ muscles)- Dry Needling, Patient/Family education, Balance training, Stair training, Taping, Joint mobilization, Joint manipulation, DME instructions, Cryotherapy, and Moist heat  PLAN FOR NEXT SESSION: review goals, review and progress HEP, progress quad and RLE strengthening, incorporate balance   Lang Ada, PT, DPT [facility] Office: [phone] 12:34 PM, 12/07/2024  [facility] Medicare Auth Request Information Treatment Start Date: 12/09/24  Date of referral: 10/26/24 Referring provider: Margrette Taft BRAVO, MD Referring diagnosis (ICD 10)? M25.561,G89.29 (ICD-10-CM) - Chronic pain of right knee Treatment diagnosis (ICD 10)? (if different than referring diagnosis) M25.561; Z74.09; Z91.81  What was this (referring dx) caused by? Fel[place] Hawks of Condition: Initial Onset (within last 3 months)   Laterality: Rt  Current Functional Measure Score: LEFS LEFS : 35 / 80 = 43.8 %  Objective measurements identify impairments when they are compared to  normal values, the uninvolved extremity, and prior level of function.  [x]  Yes  []  No  Objective assessment of functional ability: Moderate functional limitations   Briefly describe symptoms: knee pain, decreased functional mobility due to R knee ROM and strength deficits  How did symptoms start: fall on R knee  Average pain intensity:  Last 24 hours: 5/10  Past week: 8/10  How often does the pt experience symptoms? Frequently  How much have the symptoms interfered with usual daily activities? Moderately  How has condition changed since care began at this facility? NA -  initial visit  In general, how is the patients overall health? Fair   BACK PAIN (STarT Back Screening Tool) Has pain spread down the leg(s) at some time in the last 2 weeks? TBA Has there been pain in the shoulder or neck at some time in the last 2 weeks? TBA Has the pt only walked short distances because of back pain? TBA Has patient dressed more slowly because of back pain in the past 2 weeks? TBA Does patient think it's not safe for a person with this condition to be physically active? TBA Does patient have worrying thoughts a lot of the time? TBA Does patient feel back pain is terrible and will never get any better? TBA Has patient stopped enjoying things they usually enjoy? TBA   "

## 2024-12-09 ENCOUNTER — Other Ambulatory Visit: Payer: Self-pay

## 2024-12-09 ENCOUNTER — Encounter (HOSPITAL_COMMUNITY): Payer: Self-pay

## 2024-12-09 ENCOUNTER — Ambulatory Visit (HOSPITAL_COMMUNITY)

## 2024-12-09 DIAGNOSIS — Z7409 Other reduced mobility: Secondary | ICD-10-CM | POA: Diagnosis present

## 2024-12-09 DIAGNOSIS — M25561 Pain in right knee: Secondary | ICD-10-CM | POA: Diagnosis present

## 2024-12-09 DIAGNOSIS — Z9181 History of falling: Secondary | ICD-10-CM | POA: Insufficient documentation

## 2024-12-18 ENCOUNTER — Ambulatory Visit (HOSPITAL_COMMUNITY)

## 2024-12-21 ENCOUNTER — Ambulatory Visit (HOSPITAL_COMMUNITY): Attending: Orthopedic Surgery

## 2024-12-21 DIAGNOSIS — Z7409 Other reduced mobility: Secondary | ICD-10-CM | POA: Insufficient documentation

## 2024-12-21 DIAGNOSIS — M25561 Pain in right knee: Secondary | ICD-10-CM | POA: Insufficient documentation

## 2024-12-21 DIAGNOSIS — Z9181 History of falling: Secondary | ICD-10-CM | POA: Insufficient documentation

## 2024-12-21 NOTE — Therapy (Signed)
 " OUTPATIENT PHYSICAL THERAPY LOWER EXTREMITY EVALUATION   Patient Name: Hannah Schultz MRN: [id number] DOB:1961-02-27, 64 y.o., female Today's Date: 12/21/2024  END OF SESSION:  PT End of Session - 12/21/24 1634     Visit Number 2    Date for Recertification  02/05/25    Authorization Type [employer] [facility] [place]    Authorization Time Period no auth required    Progress Note Due on Visit 10    PT Start Time 1634    PT Stop Time 1717    PT Time Calculation (min) 43 min    Activity Tolerance Patient tolerated treatment well;Patient limited by pain    Behavior During Therapy WFL for tasks assessed/performed          Past Medical History:  Diagnosis Date   Acid reflux    Arthritis    Bulging lumbar disc    Cataract    Depression    Diabetes mellitus    Diabetic neuropathy (HCC)    High cholesterol    Hypertension    Post traumatic stress disorder (PTSD)    Sleep apnea    Past Surgical History:  Procedure Laterality Date   ABDOMINAL HYSTERECTOMY     BREAST BIOPSY Left 10/22/2022   MM LT BREAST BX W LOC DEV 1ST LESION IMAGE BX SPEC STEREO GUIDE 10/22/2022 [facility]   CHOLECYSTECTOMY     COLONOSCOPY     EXTRACORPOREAL SHOCK WAVE LITHOTRIPSY Right 02/04/2020   Procedure: EXTRACORPOREAL SHOCK WAVE LITHOTRIPSY (ESWL);  Surgeon: Elisabeth Valli BIRCH, MD;  Location: [facility];  Service: Urology;  Laterality: Right;   INCISION AND DRAINAGE ABSCESS N/A 02/06/2018   Procedure: INCISION AND DRAINAGE vulvar ABSCESS;  Surgeon: Latisha Medford, MD;  Location: [facility];  Service: Gynecology;  Laterality: N/A;   MEDIASTINOSCOPY N/A 04/25/2017   Procedure: MEDIASTINOSCOPY;  Surgeon: Kerrin Elspeth BROCKS, MD;  Location: [facility] OR;  Service: Thoracic;  Laterality: N/A;   TONSILLECTOMY  04/02/2012   Procedure: TONSILLECTOMY;  Surgeon: Ana LELON Moccasin, MD;  Location: [facility] OR;  Service: ENT;  Laterality: Bilateral;   Patient Active Problem List   Diagnosis Date Noted   Physical  deconditioning 09/15/2024   Hyperlipidemia associated with type 2 diabetes mellitus (HCC) 09/15/2024   Hypertension associated with stage 3a chronic kidney disease due to type 2 diabetes mellitus (HCC) 09/15/2024   PTSD (post-traumatic stress disorder) 09/15/2024   Osteoarthritis of right knee 09/15/2024   Lumbar disc disease with radiculopathy 07/11/2023   Opioid use, unspecified with unspecified opioid-induced disorder (HCC) 07/08/2023   Pulmonary HTN (HCC) 05/30/2021   Atypical chest pain 12/22/2020   Elevated d-dimer 12/22/2020   GERD (gastroesophageal reflux disease) 12/22/2020   Obesity, Class III, BMI 40-49.9 (morbid obesity) (HCC) 12/22/2020   Chronic low back pain with bilateral sciatica 12/22/2020   OSA (obstructive sleep apnea) 07/11/2020   Nausea and vomiting 12/21/2019   Stage 3 chronic kidney disease (HCC) 12/21/2019   Lumbar spondylosis 10/13/2019   Type 2 diabetes mellitus with diabetic chronic kidney disease (HCC) 09/24/2019   Sarcoidosis 08/18/2019   Morbid obesity (HCC) 08/18/2019   Family history of sarcoidosis 08/18/2019   Hyperlipidemia 09/19/2013   Essential hypertension 09/19/2013   Dyslipidemia 09/19/2013   CTS (carpal tunnel syndrome) 11/27/2012    PCP: Macarthur Elouise SQUIBB, DO   REFERRING PROVIDER: Margrette Taft BRAVO, MD  REFERRING DIAG: [phone] (ICD-10-CM) - Chronic pain of right knee  THERAPY DIAG:  Acute pain of right knee  Impaired functional mobility, balance,  gait, and endurance  History of fall  Rationale for Evaluation and Treatment: Rehabilitation  ONSET DATE: Fall on Right knee in November  SUBJECTIVE:   SUBJECTIVE STATEMENT: Pt states she has been HEP compliant but right knee continues to be bothersome, 5/10 pain reported this date. Pt presents with SPC to session today. Pt reports she has had about 7 deaths in the past few weeks.   Eval: Pt states she had a fall in November and landed on the right knee, went to the hospital  and then to [facility]. Pt states she feels she is losing her balance and has been using a cane since returning home from the hospital in November. Pt states the right knee just gives out when she is walking. Pt has not had any falls since fall in November.  PERTINENT HISTORY: Diabetics HTN, controled with medication Back trouble, right side PAIN:  Are you having pain? Yes: NPRS scale: 4/10 this date, worst in last week, 10/10 at night Pain location: medial and lateral right knee Pain description: stabbing pain Aggravating factors: walking, squatting and bending Relieving factors: ice, sitting  PRECAUTIONS: None  RED FLAGS: None   WEIGHT BEARING RESTRICTIONS: No  FALLS:  Has patient fallen in last 6 months? Yes. Number of falls 1  LIVING ENVIRONMENT: Lives with: lives with their spouse Lives in: House/apartment Stairs: Yes: External: 1 steps; none Has following equipment at home: Single point cane and Walker - 2 wheeled  OCCUPATION: retired, neurosurgeon  PLOF: Independent and Independent with basic ADLs  PATIENT GOALS: decrease right knee pain, improve balance, and get back into gym (over 6 months ago, 24/7 fitness)  NEXT MD VISIT: none at this time  OBJECTIVE:  Note: Objective measures were completed at Evaluation unless otherwise noted.  DIAGNOSTIC FINDINGS: CLINICAL DATA:  Knee pain since falling 3 weeks ago. Meniscal injury.   EXAM: MRI OF THE RIGHT KNEE WITHOUT CONTRAST   TECHNIQUE: Multiplanar, multisequence MR imaging of the knee was performed. No intravenous contrast was administered.   COMPARISON:  Radiographs and CT of the right knee 09/12/2024.   FINDINGS: MENISCI   Medial meniscus:  Intact with normal morphology.   Lateral meniscus:  Intact with normal morphology.   LIGAMENTS   Cruciates: The anterior and posterior cruciate ligaments are intact.   Collaterals: Partial tear of the medial collateral ligament proximally with mild  surrounding soft tissue edema. No ligament retraction identified. The components of the lateral collateral ligament complex appear intact.   CARTILAGE   Patellofemoral: Patellofemoral chondral thinning, surface irregularity and osteophyte formation. Prominent subcortical cyst formation laterally in the femoral trochlea.   Medial: Moderate chondral thinning, surface irregularity and peripheral osteophyte formation. There is a prominent osteophyte projecting proximally from the posterolateral aspect of the medial tibial plateau.   Lateral: Mild chondral thinning with peripheral osteophyte formation.   MISCELLANEOUS   Joint: Moderate-sized joint effusion. No intra-articular loose bodies identified.   Popliteal Fossa: The popliteus muscle and tendon are intact. No significant Baker's cyst.   Extensor Mechanism: The visualized quadriceps and patellar tendons are intact.The patellar retinacula and medial patellofemoral ligament are intact.   Bones:  No acute or significant extra-articular osseous findings.   Other: Mild prepatellar subcutaneous edema. No evidence of focal fluid collection.   IMPRESSION: 1. Partial tear of the medial collateral ligament proximally. 2. The menisci, cruciate and lateral collateral ligaments are intact. 3. Moderate tricompartmental degenerative changes. No acute osseous findings. 4. Moderate-sized joint effusion.  PATIENT SURVEYS:  LEFS : 35 / 80 = 43.8 %   SENSATION: Light touch: Impaired , pt is on gabapentin  for burning of hands and feet  EDEMA:  Pt reports swelling has decreased since the fall.   PALPATION: Tenderness noted on R knee medial(worse) and lateral joint lines and medial and lateral poles of R patella  LOWER EXTREMITY ROM:  Active ROM Right eval Left eval  Hip flexion    Hip extension    Hip abduction    Hip adduction    Hip internal rotation    Hip external rotation    Knee flexion 102 AROM 110 PROM  painful  118  Knee extension 11 from neutral in sitting, pain 11 from neutral in sitting  Ankle dorsiflexion    Ankle plantarflexion    Ankle inversion    Ankle eversion     (Blank rows = not tested)  LOWER EXTREMITY MMT:  MMT Right eval Left eval  Hip flexion 3 3+  Hip extension    Hip abduction 3+ 3+  Hip adduction 3+ 3+  Hip internal rotation    Hip external rotation    Knee flexion 3+ 5  Knee extension 3+ 5  Ankle dorsiflexion 4 4  Ankle plantarflexion    Ankle inversion    Ankle eversion     (Blank rows = not tested)  LOWER EXTREMITY SPECIAL TESTS:  Knee special tests: varus and valgus test, positive for MCL pain  FUNCTIONAL TESTS:  5 times sit to stand: 21.99 seconds, no increase in right knee pain 2 minute walk test: 238 feet, no increased in right knee pian, SPC used in left SLS 12/09/24: R: 5.75 s, slight pain in right knee L: 6.3 s   GAIT: Distance walked: 300 feet Assistive device utilized: Single point cane Level of assistance: Modified independence Comments: pt demonstrates                                                                                                                                 TREATMENT DATE:  12/21/2024  Ice pack, 5 minutes, in supine Manual Therapy: -L Patellar mobs in all planes, pt demonstrates most tenderness to lateral and inferior planes -STM of LLE from ankle to knee joint, increased swelling noted bilaterally, pt continues to present with no compression and sleeping in recliner Therapeutic Exercise: -Nustep, 5 minutes, seat 14, pt cued for 75 SPM -Hamstring curl machine, 2 sets of 10 reps, plate 4 and 5 for last two sets, pt cued for eccentric control and RLE activation  -Heel raises, 2 sets of 10 reps, bilaterally, pt cued for increased ROM and decreased pushing through Ues, in parallel bars  -TKE, on 4 inch step, 1 sets of 10 reps RLE only, pt cued for max R knee extension, lateral tracking of patella noted, therapist assist  with medial hold during las rep, unsuccessful for reliving symptoms -Knee drive and extension stretch on 12 inch step, 1  set of 10 reps, 1 second holds each direction, pt cued for OP with UE for extension and bodyweight for flexion   12/07/2024  Evaluation: -ROM measured, Strength assessed, HEP prescribed, pt educated on prognosis, findings, and importance of HEP compliance if given.         PATIENT EDUCATION:  Education details: Pt was educated on findings of PT evaluation, prognosis, frequency of therapy visits and rationale, attendance policy, and HEP if given.   Person educated: Patient Education method: Explanation, Verbal cues, and Handouts Education comprehension: verbalized understanding and needs further education  HOME EXERCISE PROGRAM: Access Code: [email or web address] URL: [email or web address] Date: 12/09/2024 Prepared by: Lang Ada  Exercises - Seated Long Arc Quad  - 1 x daily - 7 x weekly - 3 sets - 10 reps - Sit to Stand  - 1 x daily - 7 x weekly - 3 sets - 10 reps  ASSESSMENT:  CLINICAL IMPRESSION: Patient continues to demonstrate increased R knee pain, decreased RLE strength/ROM, decreased gait quality and balance. Patient also demonstrates fair endurance with aerobic based exercise during today's session on nustep. Patient able to progress dynamic balance and R knee activation exercises today with total knee extensions and hamstring curls, good performance with verbal cueing. Patient would continue to benefit from skilled physical therapy for decreased knee pain, increased endurance with ambulation, increased RLE strength/ROM, and improved balance for improved quality of life, improved independence with gait training and continued progress towards therapy goals.   Eval: Patient is a 64 y.o. female who was seen today for physical therapy evaluation and treatment for M25.561,G89.29 (ICD-10-CM) - Chronic pain of right knee. Patient demonstrates increased right  knee pain, decreased RLE strength, abnormal pain rating with functional mobility, and impaired balance. Patient also demonstrates difficulty with ambulation during today's session with SPC decreased stride length and velocity noted. Pt educated on LUE recommendation for maximal offloading of R knee during ambulation. Patient also demonstrates tenderness on medial and lateral joint lines of R knee, will consider manual therapy for improved ROM and pain management. Patient requires education on POC, HEP recommendations and importance of compliance. Patient would benefit from skilled physical therapy for decreased pain in R knee, increased endurance with ambulation, increased RLE strength/ROM, and balance for improved gait quality, return to higher level of function with ADLs, and progress towards therapy goals.   OBJECTIVE IMPAIRMENTS: Abnormal gait, decreased activity tolerance, decreased balance, decreased endurance, decreased knowledge of condition, decreased knowledge of use of DME, decreased mobility, difficulty walking, decreased ROM, decreased strength, hypomobility, and pain.   ACTIVITY LIMITATIONS: carrying, lifting, bending, sitting, standing, squatting, stairs, transfers, and bed mobility  PARTICIPATION LIMITATIONS: meal prep, cleaning, laundry, driving, shopping, community activity, and yard work  PERSONAL FACTORS: Age, Fitness, Past/current experiences, Time since onset of injury/illness/exacerbation, and 1 comorbidity: history of chronic right knee pain are also affecting patient's functional outcome.   REHAB POTENTIAL: Fair chronic in nature  CLINICAL DECISION MAKING: Stable/uncomplicated  EVALUATION COMPLEXITY: Low   GOALS: Goals reviewed with patient? No  SHORT TERM GOALS: Target date: 12/30/24  Patient will demonstrate evidence of independence with individualized HEP and will report compliance for at least 3 days per week for optimized progression towards remaining therapy  goals. Baseline:  Goal status: INITIAL  2.  Patient will report a decrease in pain level during community ambulation by at least 2 points for improved quality of life. Baseline: 4/10 Goal status: INITIAL     LONG TERM GOALS: Target date: 01/20/25  Pt will  demonstrate a an increase of at least 9 points on the LEFS for improved performance of community ambulation and ADL. Baseline: see objective Goal status: INITIAL  2.  Pt will improve 2 MWT by 40 feet with LRAD in order to demonstrate improved functional ambulatory capacity in community setting.  Baseline: see objective Goal status: INITIAL  3.  Pt will demonstrate increase of 7 degrees in ROM (flexion and extension) in right knee, for increased mobility and maximal efficiency of gait cycle during ambulation. Baseline: see objective Goal status: INITIAL  4.  Pt will demonstrate at least 4+/5 MMT for right lower extremity for increased strength during ADL and community ambulation. Baseline: see objective Goal status: INITIAL  5.  Pt will improve SLS by 7 seconds bilaterally in order to improve balance during functional activities. Baseline: see objective Goal status: INITIAL    PLAN:  PT FREQUENCY: 2x/week  PT DURATION: 6 weeks  PLANNED INTERVENTIONS: 97110-Therapeutic exercises, 97530- Therapeutic activity, 97112- Neuromuscular re-education, 97535- Self Care, [id number]- Manual therapy, [phone]- Gait training, [phone] (1-2 muscles), [phone] (3+ muscles)- Dry Needling, Patient/Family education, Balance training, Stair training, Taping, Joint mobilization, Joint manipulation, DME instructions, Cryotherapy, and Moist heat  PLAN FOR NEXT SESSION: review goals, review and progress HEP, progress quad and RLE strengthening, incorporate balance   Lang Ada, PT, DPT [facility] Office: [phone] 5:34 PM, 12/21/2024   "

## 2024-12-28 ENCOUNTER — Other Ambulatory Visit: Payer: Self-pay | Admitting: Adult Health

## 2024-12-30 ENCOUNTER — Ambulatory Visit (HOSPITAL_COMMUNITY): Admitting: Physical Therapy

## 2024-12-30 DIAGNOSIS — Z7409 Other reduced mobility: Secondary | ICD-10-CM

## 2024-12-30 DIAGNOSIS — Z9181 History of falling: Secondary | ICD-10-CM

## 2024-12-30 DIAGNOSIS — M25561 Pain in right knee: Secondary | ICD-10-CM

## 2024-12-30 NOTE — Therapy (Signed)
 " OUTPATIENT PHYSICAL THERAPY LOWER EXTREMITY EVALUATION   Patient Name: Hannah Schultz MRN: [id number] DOB:August 05, 1961, 64 y.o., female Today's Date: 12/30/2024  END OF SESSION:  PT End of Session - 12/30/24 1430     Visit Number 3    Date for Recertification  02/05/25    Authorization Type [employer] [facility] [place]    Authorization Time Period no auth required    Progress Note Due on Visit 10    PT Start Time 1423    PT Stop Time 1501    PT Time Calculation (min) 38 min    Activity Tolerance Patient tolerated treatment well;Patient limited by pain    Behavior During Therapy WFL for tasks assessed/performed          Past Medical History:  Diagnosis Date   Acid reflux    Arthritis    Bulging lumbar disc    Cataract    Depression    Diabetes mellitus    Diabetic neuropathy (HCC)    High cholesterol    Hypertension    Post traumatic stress disorder (PTSD)    Sleep apnea    Past Surgical History:  Procedure Laterality Date   ABDOMINAL HYSTERECTOMY     BREAST BIOPSY Left 10/22/2022   MM LT BREAST BX W LOC DEV 1ST LESION IMAGE BX SPEC STEREO GUIDE 10/22/2022 [facility]   CHOLECYSTECTOMY     COLONOSCOPY     EXTRACORPOREAL SHOCK WAVE LITHOTRIPSY Right 02/04/2020   Procedure: EXTRACORPOREAL SHOCK WAVE LITHOTRIPSY (ESWL);  Surgeon: Elisabeth Valli BIRCH, MD;  Location: [facility];  Service: Urology;  Laterality: Right;   INCISION AND DRAINAGE ABSCESS N/A 02/06/2018   Procedure: INCISION AND DRAINAGE vulvar ABSCESS;  Surgeon: Latisha Medford, MD;  Location: [facility];  Service: Gynecology;  Laterality: N/A;   MEDIASTINOSCOPY N/A 04/25/2017   Procedure: MEDIASTINOSCOPY;  Surgeon: Kerrin Elspeth BROCKS, MD;  Location: [facility];  Service: Thoracic;  Laterality: N/A;   TONSILLECTOMY  04/02/2012   Procedure: TONSILLECTOMY;  Surgeon: Ana LELON Moccasin, MD;  Location: [facility] OR;  Service: ENT;  Laterality: Bilateral;   Patient Active Problem List   Diagnosis Date Noted   Physical  deconditioning 09/15/2024   Hyperlipidemia associated with type 2 diabetes mellitus (HCC) 09/15/2024   Hypertension associated with stage 3a chronic kidney disease due to type 2 diabetes mellitus (HCC) 09/15/2024   PTSD (post-traumatic stress disorder) 09/15/2024   Osteoarthritis of right knee 09/15/2024   Lumbar disc disease with radiculopathy 07/11/2023   Opioid use, unspecified with unspecified opioid-induced disorder (HCC) 07/08/2023   Pulmonary HTN (HCC) 05/30/2021   Atypical chest pain 12/22/2020   Elevated d-dimer 12/22/2020   GERD (gastroesophageal reflux disease) 12/22/2020   Obesity, Class III, BMI 40-49.9 (morbid obesity) (HCC) 12/22/2020   Chronic low back pain with bilateral sciatica 12/22/2020   OSA (obstructive sleep apnea) 07/11/2020   Nausea and vomiting 12/21/2019   Stage 3 chronic kidney disease (HCC) 12/21/2019   Lumbar spondylosis 10/13/2019   Type 2 diabetes mellitus with diabetic chronic kidney disease (HCC) 09/24/2019   Sarcoidosis 08/18/2019   Morbid obesity (HCC) 08/18/2019   Family history of sarcoidosis 08/18/2019   Hyperlipidemia 09/19/2013   Essential hypertension 09/19/2013   Dyslipidemia 09/19/2013   CTS (carpal tunnel syndrome) 11/27/2012    PCP: Macarthur Elouise SQUIBB, DO   REFERRING PROVIDER: Margrette Taft BRAVO, MD  REFERRING DIAG: [phone] (ICD-10-CM) - Chronic pain of right knee  THERAPY DIAG:  Acute pain of right knee  Impaired functional mobility, balance,  gait, and endurance  History of fall  Rationale for Evaluation and Treatment: Rehabilitation  ONSET DATE: Fall on Right knee in November  SUBJECTIVE:   SUBJECTIVE STATEMENT: Pt states she has been compliant with HEP. Still having Rt knee pain and also some pain into her quad. Continues to use [facility] and reports walking around her house as much as she can.  States she's been taking care of her husband that just had spinal surgery and he actually starts therapy next week.    Eval:  Pt states she had a fall in November and landed on the right knee, went to the hospital and then to [facility]. Pt states she feels she is losing her balance and has been using a cane since returning home from the hospital in November. Pt states the right knee just gives out when she is walking. Pt has not had any falls since fall in November.  PERTINENT HISTORY: Diabetics HTN, controled with medication Back trouble, right side PAIN:  Are you having pain? Yes: NPRS scale: 4/10 this date, worst in last week, 10/10 at night Pain location: medial and lateral right knee Pain description: stabbing pain Aggravating factors: walking, squatting and bending Relieving factors: ice, sitting  PRECAUTIONS: None  RED FLAGS: None   WEIGHT BEARING RESTRICTIONS: No  FALLS:  Has patient fallen in last 6 months? Yes. Number of falls 1  LIVING ENVIRONMENT: Lives with: lives with their spouse Lives in: House/apartment Stairs: Yes: External: 1 steps; none Has following equipment at home: Single point cane and Walker - 2 wheeled  OCCUPATION: retired, neurosurgeon  PLOF: Independent and Independent with basic ADLs  PATIENT GOALS: decrease right knee pain, improve balance, and get back into gym (over 6 months ago, 24/7 fitness)  NEXT MD VISIT: none at this time  OBJECTIVE:  Note: Objective measures were completed at Evaluation unless otherwise noted.  DIAGNOSTIC FINDINGS: CLINICAL DATA:  Knee pain since falling 3 weeks ago. Meniscal injury.   EXAM: MRI OF THE RIGHT KNEE WITHOUT CONTRAST   TECHNIQUE: Multiplanar, multisequence MR imaging of the knee was performed. No intravenous contrast was administered.   COMPARISON:  Radiographs and CT of the right knee 09/12/2024.   FINDINGS: MENISCI   Medial meniscus:  Intact with normal morphology.   Lateral meniscus:  Intact with normal morphology.   LIGAMENTS   Cruciates: The anterior and posterior cruciate ligaments are intact.    Collaterals: Partial tear of the medial collateral ligament proximally with mild surrounding soft tissue edema. No ligament retraction identified. The components of the lateral collateral ligament complex appear intact.   CARTILAGE   Patellofemoral: Patellofemoral chondral thinning, surface irregularity and osteophyte formation. Prominent subcortical cyst formation laterally in the femoral trochlea.   Medial: Moderate chondral thinning, surface irregularity and peripheral osteophyte formation. There is a prominent osteophyte projecting proximally from the posterolateral aspect of the medial tibial plateau.   Lateral: Mild chondral thinning with peripheral osteophyte formation.   MISCELLANEOUS   Joint: Moderate-sized joint effusion. No intra-articular loose bodies identified.   Popliteal Fossa: The popliteus muscle and tendon are intact. No significant Baker's cyst.   Extensor Mechanism: The visualized quadriceps and patellar tendons are intact.The patellar retinacula and medial patellofemoral ligament are intact.   Bones:  No acute or significant extra-articular osseous findings.   Other: Mild prepatellar subcutaneous edema. No evidence of focal fluid collection.   IMPRESSION: 1. Partial tear of the medial collateral ligament proximally. 2. The menisci, cruciate and lateral collateral ligaments are  intact. 3. Moderate tricompartmental degenerative changes. No acute osseous findings. 4. Moderate-sized joint effusion.  PATIENT SURVEYS:  LEFS : 35 / 80 = 43.8 %   SENSATION: Light touch: Impaired , pt is on gabapentin  for burning of hands and feet  EDEMA:  Pt reports swelling has decreased since the fall.   PALPATION: Tenderness noted on R knee medial(worse) and lateral joint lines and medial and lateral poles of R patella  LOWER EXTREMITY ROM:  Active ROM Right eval Left eval  Hip flexion    Hip extension    Hip abduction    Hip adduction    Hip  internal rotation    Hip external rotation    Knee flexion 102 AROM 110 PROM  painful 118  Knee extension 11 from neutral in sitting, pain 11 from neutral in sitting  Ankle dorsiflexion    Ankle plantarflexion    Ankle inversion    Ankle eversion     (Blank rows = not tested)  LOWER EXTREMITY MMT:  MMT Right eval Left eval  Hip flexion 3 3+  Hip extension    Hip abduction 3+ 3+  Hip adduction 3+ 3+  Hip internal rotation    Hip external rotation    Knee flexion 3+ 5  Knee extension 3+ 5  Ankle dorsiflexion 4 4  Ankle plantarflexion    Ankle inversion    Ankle eversion     (Blank rows = not tested)  LOWER EXTREMITY SPECIAL TESTS:  Knee special tests: varus and valgus test, positive for MCL pain  FUNCTIONAL TESTS:  5 times sit to stand: 21.99 seconds, no increase in right knee pain 2 minute walk test: 238 feet, no increased in right knee pian, SPC used in left SLS 12/09/24: R: 5.75 s, slight pain in right knee L: 6.3 s   GAIT: Distance walked: 300 feet Assistive device utilized: Single point cane Level of assistance: Modified independence Comments: pt demonstrates                                                                                                                                 TREATMENT DATE:  12/30/2024  Nustep seat 10 UE/LE level 4, 5 minutes Education on recruting core mm Supine:  TrA isometrics 10X5  Bridge (glute sets) 10X5  SLR 2X5 each LE  Seated: LAQ 10X each  Sit to stand standard chair with 2 foam in seat, no UE assist 5X   12/21/2024  Ice pack, 5 minutes, in supine Manual Therapy: -L Patellar mobs in all planes, pt demonstrates most tenderness to lateral and inferior planes -STM of LLE from ankle to knee joint, increased swelling noted bilaterally, pt continues to present with no compression and sleeping in recliner Therapeutic Exercise: -Nustep, 5 minutes, seat 14, pt cued for 75 SPM -Hamstring curl machine, 2 sets of 10 reps,  plate 4 and 5 for last two sets, pt cued for eccentric control and RLE  activation  -Heel raises, 2 sets of 10 reps, bilaterally, pt cued for increased ROM and decreased pushing through Ues, in parallel bars  -TKE, on 4 inch step, 1 sets of 10 reps RLE only, pt cued for max R knee extension, lateral tracking of patella noted, therapist assist with medial hold during las rep, unsuccessful for reliving symptoms -Knee drive and extension stretch on 12 inch step, 1 set of 10 reps, 1 second holds each direction, pt cued for OP with UE for extension and bodyweight for flexion   12/07/2024  Evaluation: -ROM measured, Strength assessed, HEP prescribed, pt educated on prognosis, findings, and importance of HEP compliance if given.      PATIENT EDUCATION:  Education details: Pt was educated on findings of PT evaluation, prognosis, frequency of therapy visits and rationale, attendance policy, and HEP if given.   Person educated: Patient Education method: Explanation, Verbal cues, and Handouts Education comprehension: verbalized understanding and needs further education  HOME EXERCISE PROGRAM: Access Code: [email or web address] URL: [email or web address] Date: 12/09/2024 Prepared by: Lang Ada  Exercises - Seated Long Arc Quad  - 1 x daily - 7 x weekly - 3 sets - 10 reps - Sit to Stand  - 1 x daily - 7 x weekly - 3 sets - 10 reps  ASSESSMENT:  CLINICAL IMPRESSION: Today's session focused on improving core, LE strength and stabilization.  Educated on importance of core recruitment with all activities.  Pt able to produce isometric contraction and utilize with all other LE exercises completed during session.   Patient unable to stand from standard chair height without using UE, however with added 2 cushion was able to complete.  SLR completed with good form.  Discussed importance of cardio and encouraged to increase her walking.  Patient will continue to benefit from skilled physical therapy  for decreased knee pain, increased endurance with ambulation, increased RLE strength/ROM, and improved balance for improved quality of life, improved independence with gait training and continued progress towards therapy goals.   Eval: Patient is a 64 y.o. female who was seen today for physical therapy evaluation and treatment for M25.561,G89.29 (ICD-10-CM) - Chronic pain of right knee. Patient demonstrates increased right knee pain, decreased RLE strength, abnormal pain rating with functional mobility, and impaired balance. Patient also demonstrates difficulty with ambulation during today's session with SPC decreased stride length and velocity noted. Pt educated on LUE recommendation for maximal offloading of R knee during ambulation. Patient also demonstrates tenderness on medial and lateral joint lines of R knee, will consider manual therapy for improved ROM and pain management. Patient requires education on POC, HEP recommendations and importance of compliance. Patient would benefit from skilled physical therapy for decreased pain in R knee, increased endurance with ambulation, increased RLE strength/ROM, and balance for improved gait quality, return to higher level of function with ADLs, and progress towards therapy goals.   OBJECTIVE IMPAIRMENTS: Abnormal gait, decreased activity tolerance, decreased balance, decreased endurance, decreased knowledge of condition, decreased knowledge of use of DME, decreased mobility, difficulty walking, decreased ROM, decreased strength, hypomobility, and pain.   ACTIVITY LIMITATIONS: carrying, lifting, bending, sitting, standing, squatting, stairs, transfers, and bed mobility  PARTICIPATION LIMITATIONS: meal prep, cleaning, laundry, driving, shopping, community activity, and yard work  PERSONAL FACTORS: Age, Fitness, Past/current experiences, Time since onset of injury/illness/exacerbation, and 1 comorbidity: history of chronic right knee pain are also affecting  patient's functional outcome.   REHAB POTENTIAL: Fair chronic in nature  CLINICAL DECISION MAKING: Stable/uncomplicated  EVALUATION COMPLEXITY: Low  GOALS: Goals reviewed with patient? No  SHORT TERM GOALS: Target date: 12/30/24  Patient will demonstrate evidence of independence with individualized HEP and will report compliance for at least 3 days per week for optimized progression towards remaining therapy goals. Baseline:  Goal status: INITIAL  2.  Patient will report a decrease in pain level during community ambulation by at least 2 points for improved quality of life. Baseline: 4/10 Goal status: INITIAL     LONG TERM GOALS: Target date: 01/20/25  Pt will demonstrate a an increase of at least 9 points on the LEFS for improved performance of community ambulation and ADL. Baseline: see objective Goal status: INITIAL  2.  Pt will improve 2 MWT by 40 feet with LRAD in order to demonstrate improved functional ambulatory capacity in community setting.  Baseline: see objective Goal status: INITIAL  3.  Pt will demonstrate increase of 7 degrees in ROM (flexion and extension) in right knee, for increased mobility and maximal efficiency of gait cycle during ambulation. Baseline: see objective Goal status: INITIAL  4.  Pt will demonstrate at least 4+/5 MMT for right lower extremity for increased strength during ADL and community ambulation. Baseline: see objective Goal status: INITIAL  5.  Pt will improve SLS by 7 seconds bilaterally in order to improve balance during functional activities. Baseline: see objective Goal status: INITIAL    PLAN:  PT FREQUENCY: 2x/week  PT DURATION: 6 weeks  PLANNED INTERVENTIONS: 97110-Therapeutic exercises, 97530- Therapeutic activity, 97112- Neuromuscular re-education, 97535- Self Care, [id number]- Manual therapy, [phone]- Gait training, [phone] (1-2 muscles), [phone] (3+ muscles)- Dry Needling, Patient/Family education, Balance training, Stair  training, Taping, Joint mobilization, Joint manipulation, DME instructions, Cryotherapy, and Moist heat  PLAN FOR NEXT SESSION: Progress quad and RLE strengthening, incorporate balance   Greig KATHEE Fuse, PTA/CLT [facility] Ph: [phone] 3:04 PM, 12/30/24   "

## 2025-01-01 ENCOUNTER — Telehealth (HOSPITAL_COMMUNITY): Payer: Self-pay

## 2025-01-01 ENCOUNTER — Ambulatory Visit (HOSPITAL_COMMUNITY)

## 2025-01-01 NOTE — Telephone Encounter (Signed)
 Patient was called concerning her missed appointment. No answer but left message with details about no show policy, next treatment time, and office number should they need to reschedule or have any questions. No show 1.  Hannah Schultz, PT, DPT [facility] Office: [phone] 4:42 PM, 01/01/25

## 2025-01-05 ENCOUNTER — Encounter (HOSPITAL_COMMUNITY): Payer: Self-pay

## 2025-01-05 ENCOUNTER — Ambulatory Visit (HOSPITAL_COMMUNITY)

## 2025-01-05 DIAGNOSIS — Z7409 Other reduced mobility: Secondary | ICD-10-CM

## 2025-01-05 DIAGNOSIS — Z9181 History of falling: Secondary | ICD-10-CM

## 2025-01-05 DIAGNOSIS — M25561 Pain in right knee: Secondary | ICD-10-CM

## 2025-01-05 NOTE — Therapy (Signed)
 " OUTPATIENT PHYSICAL THERAPY LOWER EXTREMITY EVALUATION   Patient Name: Hannah Schultz MRN: [id number] DOB:[birth year], 64 y.o., female Today's Date: 01/05/2025  END OF SESSION:  PT End of Session - 01/05/25 1502     Visit Number 4    Number of Visits 12    Date for Recertification  02/05/25    Authorization Type [employer] - [place]    Authorization Time Period no auth required    Progress Note Due on Visit 10    PT Start Time 1504    PT Stop Time 1547    PT Time Calculation (min) 43 min    Activity Tolerance Patient tolerated treatment well;Patient limited by pain;No increased pain    Behavior During Therapy WFL for tasks assessed/performed          Past Medical History:  Diagnosis Date   Acid reflux    Arthritis    Bulging lumbar disc    Cataract    Depression    Diabetes mellitus    Diabetic neuropathy (HCC)    High cholesterol    Hypertension    Post traumatic stress disorder (PTSD)    Sleep apnea    Past Surgical History:  Procedure Laterality Date   ABDOMINAL HYSTERECTOMY     BREAST BIOPSY Left 10/22/2022   MM LT BREAST BX W LOC DEV 1ST LESION IMAGE BX SPEC STEREO GUIDE 10/22/2022 [facility]   CHOLECYSTECTOMY     COLONOSCOPY     EXTRACORPOREAL SHOCK WAVE LITHOTRIPSY Right 02/04/2020   Procedure: EXTRACORPOREAL SHOCK WAVE LITHOTRIPSY (ESWL);  Surgeon: Elisabeth Valli BIRCH, MD;  Location: [facility];  Service: Urology;  Laterality: Right;   INCISION AND DRAINAGE ABSCESS N/A 02/06/2018   Procedure: INCISION AND DRAINAGE vulvar ABSCESS;  Surgeon: Latisha Medford, MD;  Location: [facility];  Service: Gynecology;  Laterality: N/A;   MEDIASTINOSCOPY N/A 04/25/2017   Procedure: MEDIASTINOSCOPY;  Surgeon: Kerrin Elspeth BROCKS, MD;  Location: [facility] OR;  Service: Thoracic;  Laterality: N/A;   TONSILLECTOMY  04/02/2012   Procedure: TONSILLECTOMY;  Surgeon: Ana LELON Moccasin, MD;  Location: [facility] OR;  Service: ENT;  Laterality: Bilateral;   Patient Active Problem  List   Diagnosis Date Noted   Physical deconditioning 09/15/2024   Hyperlipidemia associated with type 2 diabetes mellitus (HCC) 09/15/2024   Hypertension associated with stage 3a chronic kidney disease due to type 2 diabetes mellitus (HCC) 09/15/2024   PTSD (post-traumatic stress disorder) 09/15/2024   Osteoarthritis of right knee 09/15/2024   Lumbar disc disease with radiculopathy 07/11/2023   Opioid use, unspecified with unspecified opioid-induced disorder (HCC) 07/08/2023   Pulmonary HTN (HCC) 05/30/2021   Atypical chest pain 12/22/2020   Elevated d-dimer 12/22/2020   GERD (gastroesophageal reflux disease) 12/22/2020   Obesity, Class III, BMI 40-49.9 (morbid obesity) (HCC) 12/22/2020   Chronic low back pain with bilateral sciatica 12/22/2020   OSA (obstructive sleep apnea) 07/11/2020   Nausea and vomiting 12/21/2019   Stage 3 chronic kidney disease (HCC) 12/21/2019   Lumbar spondylosis 10/13/2019   Type 2 diabetes mellitus with diabetic chronic kidney disease (HCC) 09/24/2019   Sarcoidosis 08/18/2019   Morbid obesity (HCC) 08/18/2019   Family history of sarcoidosis 08/18/2019   Hyperlipidemia 09/19/2013   Essential hypertension 09/19/2013   Dyslipidemia 09/19/2013   CTS (carpal tunnel syndrome) 11/27/2012    PCP: Macarthur Elouise SQUIBB, DO   REFERRING PROVIDER: Margrette Taft BRAVO, MD  REFERRING DIAG: [phone] (ICD-10-CM) - Chronic pain of right knee  THERAPY DIAG:  Acute  pain of right knee  Impaired functional mobility, balance, gait, and endurance  History of fall  Rationale for Evaluation and Treatment: Rehabilitation  ONSET DATE: Fall on Right knee in November  SUBJECTIVE:   SUBJECTIVE STATEMENT: Reports of intermittent burning on medial aspect of Rt knee, pain scale 5/10.  Pt ambulating with SPC.  Reports it is icy around home, no reports of recent fall.  Reports she has sciatic pain just past her Rt knee, difficulty putting on socks and shoes due to mobility  and pain in positions.  Eval: Pt states she had a fall in November and landed on the right knee, went to the hospital and then to [facility]. Pt states she feels she is losing her balance and has been using a cane since returning home from the hospital in November. Pt states the right knee just gives out when she is walking. Pt has not had any falls since fall in November.  PERTINENT HISTORY: Diabetics HTN, controled with medication Back trouble, right side PAIN:  Are you having pain? Yes: NPRS scale: 4/10 this date, worst in last week, 10/10 at night Pain location: medial and lateral right knee Pain description: stabbing pain Aggravating factors: walking, squatting and bending Relieving factors: ice, sitting  PRECAUTIONS: None  RED FLAGS: None   WEIGHT BEARING RESTRICTIONS: No  FALLS:  Has patient fallen in last 6 months? Yes. Number of falls 1  LIVING ENVIRONMENT: Lives with: lives with their spouse Lives in: House/apartment Stairs: Yes: External: 1 steps; none Has following equipment at home: Single point cane and Walker - 2 wheeled  OCCUPATION: retired, neurosurgeon  PLOF: Independent and Independent with basic ADLs  PATIENT GOALS: decrease right knee pain, improve balance, and get back into gym (over 6 months ago, 24/7 fitness)  NEXT MD VISIT: none at this time  OBJECTIVE:  Note: Objective measures were completed at Evaluation unless otherwise noted.  DIAGNOSTIC FINDINGS: CLINICAL DATA:  Knee pain since falling 3 weeks ago. Meniscal injury.   EXAM: MRI OF THE RIGHT KNEE WITHOUT CONTRAST   TECHNIQUE: Multiplanar, multisequence MR imaging of the knee was performed. No intravenous contrast was administered.   COMPARISON:  Radiographs and CT of the right knee 09/12/2024.   FINDINGS: MENISCI   Medial meniscus:  Intact with normal morphology.   Lateral meniscus:  Intact with normal morphology.   LIGAMENTS   Cruciates: The anterior and posterior  cruciate ligaments are intact.   Collaterals: Partial tear of the medial collateral ligament proximally with mild surrounding soft tissue edema. No ligament retraction identified. The components of the lateral collateral ligament complex appear intact.   CARTILAGE   Patellofemoral: Patellofemoral chondral thinning, surface irregularity and osteophyte formation. Prominent subcortical cyst formation laterally in the femoral trochlea.   Medial: Moderate chondral thinning, surface irregularity and peripheral osteophyte formation. There is a prominent osteophyte projecting proximally from the posterolateral aspect of the medial tibial plateau.   Lateral: Mild chondral thinning with peripheral osteophyte formation.   MISCELLANEOUS   Joint: Moderate-sized joint effusion. No intra-articular loose bodies identified.   Popliteal Fossa: The popliteus muscle and tendon are intact. No significant Baker's cyst.   Extensor Mechanism: The visualized quadriceps and patellar tendons are intact.The patellar retinacula and medial patellofemoral ligament are intact.   Bones:  No acute or significant extra-articular osseous findings.   Other: Mild prepatellar subcutaneous edema. No evidence of focal fluid collection.   IMPRESSION: 1. Partial tear of the medial collateral ligament proximally. 2. The menisci, cruciate  and lateral collateral ligaments are intact. 3. Moderate tricompartmental degenerative changes. No acute osseous findings. 4. Moderate-sized joint effusion.  PATIENT SURVEYS:  LEFS : 35 / 80 = 43.8 %   SENSATION: Light touch: Impaired , pt is on gabapentin  for burning of hands and feet  EDEMA:  Pt reports swelling has decreased since the fall.   PALPATION: Tenderness noted on R knee medial(worse) and lateral joint lines and medial and lateral poles of R patella  LOWER EXTREMITY ROM:  Active ROM Right eval Left eval Left 01/05/25:  Hip flexion     Hip extension      Hip abduction     Hip adduction     Hip internal rotation     Hip external rotation     Knee flexion 102 AROM 110 PROM  painful 118 118  Knee extension 11 from neutral in sitting, pain 11 from neutral in sitting 4  Ankle dorsiflexion     Ankle plantarflexion     Ankle inversion     Ankle eversion      (Blank rows = not tested)  LOWER EXTREMITY MMT:  MMT Right eval Left eval  Hip flexion 3 3+  Hip extension    Hip abduction 3+ 3+  Hip adduction 3+ 3+  Hip internal rotation    Hip external rotation    Knee flexion 3+ 5  Knee extension 3+ 5  Ankle dorsiflexion 4 4  Ankle plantarflexion    Ankle inversion    Ankle eversion     (Blank rows = not tested)  LOWER EXTREMITY SPECIAL TESTS:  Knee special tests: varus and valgus test, positive for MCL pain  FUNCTIONAL TESTS:  5 times sit to stand: 21.99 seconds, no increase in right knee pain 2 minute walk test: 238 feet, no increased in right knee pian, SPC used in left SLS 12/09/24: R: 5.75 s, slight pain in right knee L: 6.3 s   GAIT: Distance walked: 300 feet Assistive device utilized: Single point cane Level of assistance: Modified independence Comments: pt demonstrates                                                                                                                                 TREATMENT DATE:  01/05/25: Supine: Bridge with ball squeeze (partial raise) 10x 5 LTR 5x 10 AROM 4-118 TKE with half bolster 10x 5 Sidelying:  Clam with RTB around thigh 10x 5 Sit to stand, elevated height to 24in, no HHA 10x, eccentric control Sidestep down hallway with UE support on wall 1RT with cueing for posture and to reduce ER  12/30/2024  Nustep seat 10 UE/LE level 4, 5 minutes Education on recruting core mm Supine:  TrA isometrics 10X5  Bridge (glute sets) 10X5  SLR 2X5 each LE  Seated: LAQ 10X each  Sit to stand standard chair with 2 foam in seat, no UE assist 5X   12/21/2024  Ice pack, 5  minutes, in supine Manual Therapy: -L Patellar mobs in all planes, pt demonstrates most tenderness to lateral and inferior planes -STM of LLE from ankle to knee joint, increased swelling noted bilaterally, pt continues to present with no compression and sleeping in recliner Therapeutic Exercise: -Nustep, 5 minutes, seat 14, pt cued for 75 SPM -Hamstring curl machine, 2 sets of 10 reps, plate 4 and 5 for last two sets, pt cued for eccentric control and RLE activation  -Heel raises, 2 sets of 10 reps, bilaterally, pt cued for increased ROM and decreased pushing through Ues, in parallel bars  -TKE, on 4 inch step, 1 sets of 10 reps RLE only, pt cued for max R knee extension, lateral tracking of patella noted, therapist assist with medial hold during las rep, unsuccessful for reliving symptoms -Knee drive and extension stretch on 12 inch step, 1 set of 10 reps, 1 second holds each direction, pt cued for OP with UE for extension and bodyweight for flexion   12/07/2024  Evaluation: -ROM measured, Strength assessed, HEP prescribed, pt educated on prognosis, findings, and importance of HEP compliance if given.      PATIENT EDUCATION:  Education details: Pt was educated on findings of PT evaluation, prognosis, frequency of therapy visits and rationale, attendance policy, and HEP if given.   Person educated: Patient Education method: Explanation, Verbal cues, and Handouts Education comprehension: verbalized understanding and needs further education  HOME EXERCISE PROGRAM: Access Code: [email or web address] URL: [email or web address] Date: 12/09/2024 Prepared by: Lang Ada  Exercises - Seated Long Arc Quad  - 1 x daily - 7 x weekly - 3 sets - 10 reps - Sit to Stand  - 1 x daily - 7 x weekly - 3 sets - 10 reps  01/05/25:  - Supine Bridge  - 2 x daily - 7 x weekly - 2 sets - 10 reps - 5 hold - Clam with Resistance  - 2 x daily - 7 x weekly - 2 sets - 10 reps - 5  hold  ASSESSMENT:  CLINICAL IMPRESSION: Session focus with knee/hip mobility and proximal LE strengthening.  Pt with sciatic like symptoms during gait, added mobility exercises for pain control with improved mechanics following.  Educated importance of gluteal strength to assist with gait and balance.  Added bridges and clams to HEP with good tolerance, printout given with verbalized understanding.  Pt required elevated height with STS due to weakness and cueing during sidestep to improve posture and reduce ER during sidestep exercises. EOS pain reduced to 3/10.  Eval: Patient is a 64 y.o. female who was seen today for physical therapy evaluation and treatment for M25.561,G89.29 (ICD-10-CM) - Chronic pain of right knee. Patient demonstrates increased right knee pain, decreased RLE strength, abnormal pain rating with functional mobility, and impaired balance. Patient also demonstrates difficulty with ambulation during today's session with SPC decreased stride length and velocity noted. Pt educated on LUE recommendation for maximal offloading of R knee during ambulation. Patient also demonstrates tenderness on medial and lateral joint lines of R knee, will consider manual therapy for improved ROM and pain management. Patient requires education on POC, HEP recommendations and importance of compliance. Patient would benefit from skilled physical therapy for decreased pain in R knee, increased endurance with ambulation, increased RLE strength/ROM, and balance for improved gait quality, return to higher level of function with ADLs, and progress towards therapy goals.   OBJECTIVE IMPAIRMENTS: Abnormal gait, decreased activity tolerance, decreased balance, decreased endurance, decreased knowledge of condition, decreased knowledge  of use of DME, decreased mobility, difficulty walking, decreased ROM, decreased strength, hypomobility, and pain.   ACTIVITY LIMITATIONS: carrying, lifting, bending, sitting, standing,  squatting, stairs, transfers, and bed mobility  PARTICIPATION LIMITATIONS: meal prep, cleaning, laundry, driving, shopping, community activity, and yard work  PERSONAL FACTORS: Age, Fitness, Past/current experiences, Time since onset of injury/illness/exacerbation, and 1 comorbidity: history of chronic right knee pain are also affecting patient's functional outcome.   REHAB POTENTIAL: Fair chronic in nature  CLINICAL DECISION MAKING: Stable/uncomplicated  EVALUATION COMPLEXITY: Low   GOALS: Goals reviewed with patient? No  SHORT TERM GOALS: Target date: 12/30/24  Patient will demonstrate evidence of independence with individualized HEP and will report compliance for at least 3 days per week for optimized progression towards remaining therapy goals. Baseline:  Goal status: INITIAL  2.  Patient will report a decrease in pain level during community ambulation by at least 2 points for improved quality of life. Baseline: 4/10 Goal status: INITIAL     LONG TERM GOALS: Target date: 01/20/25  Pt will demonstrate a an increase of at least 9 points on the LEFS for improved performance of community ambulation and ADL. Baseline: see objective Goal status: INITIAL  2.  Pt will improve 2 MWT by 40 feet with LRAD in order to demonstrate improved functional ambulatory capacity in community setting.  Baseline: see objective Goal status: INITIAL  3.  Pt will demonstrate increase of 7 degrees in ROM (flexion and extension) in right knee, for increased mobility and maximal efficiency of gait cycle during ambulation. Baseline: see objective Goal status: INITIAL  4.  Pt will demonstrate at least 4+/5 MMT for right lower extremity for increased strength during ADL and community ambulation. Baseline: see objective Goal status: INITIAL  5.  Pt will improve SLS by 7 seconds bilaterally in order to improve balance during functional activities. Baseline: see objective Goal status:  INITIAL    PLAN:  PT FREQUENCY: 2x/week  PT DURATION: 6 weeks  PLANNED INTERVENTIONS: 97110-Therapeutic exercises, 97530- Therapeutic activity, 97112- Neuromuscular re-education, 97535- Self Care, [id number]- Manual therapy, [phone]- Gait training, [phone] (1-2 muscles), [id number] (3+ muscles)- Dry Needling, Patient/Family education, Balance training, Stair training, Taping, Joint mobilization, Joint manipulation, DME instructions, Cryotherapy, and Moist heat  PLAN FOR NEXT SESSION: Progress quad and RLE strengthening, incorporate balance  Augustin Mclean, LPTA/CLT; CBIS [phone]  3:57 PM, 01/05/25   "

## 2025-01-07 ENCOUNTER — Encounter (HOSPITAL_COMMUNITY): Payer: Self-pay

## 2025-01-07 ENCOUNTER — Ambulatory Visit (HOSPITAL_COMMUNITY)

## 2025-01-07 DIAGNOSIS — Z7409 Other reduced mobility: Secondary | ICD-10-CM

## 2025-01-07 DIAGNOSIS — Z9181 History of falling: Secondary | ICD-10-CM

## 2025-01-07 DIAGNOSIS — M25561 Pain in right knee: Secondary | ICD-10-CM

## 2025-01-07 NOTE — Therapy (Signed)
 " OUTPATIENT PHYSICAL THERAPY LOWER EXTREMITY EVALUATION   Patient Name: Hannah Schultz MRN: [id number] DOB:1960-12-27, 64 y.o., female Today's Date: 01/07/2025  END OF SESSION:  PT End of Session - 01/07/25 1633     Visit Number 5    Number of Visits 12    Date for Recertification  02/05/25    Authorization Type [employer] [facility] [place]    Authorization Time Period no auth required    Authorization - Visit Number 2    Progress Note Due on Visit 10    PT Start Time 1633    PT Stop Time 1711    PT Time Calculation (min) 38 min    Activity Tolerance Patient tolerated treatment well;Patient limited by pain;No increased pain    Behavior During Therapy WFL for tasks assessed/performed          Past Medical History:  Diagnosis Date   Acid reflux    Arthritis    Bulging lumbar disc    Cataract    Depression    Diabetes mellitus    Diabetic neuropathy (HCC)    High cholesterol    Hypertension    Post traumatic stress disorder (PTSD)    Sleep apnea    Past Surgical History:  Procedure Laterality Date   ABDOMINAL HYSTERECTOMY     BREAST BIOPSY Left 10/22/2022   MM LT BREAST BX W LOC DEV 1ST LESION IMAGE BX SPEC STEREO GUIDE 10/22/2022 [facility]   CHOLECYSTECTOMY     COLONOSCOPY     EXTRACORPOREAL SHOCK WAVE LITHOTRIPSY Right 02/04/2020   Procedure: EXTRACORPOREAL SHOCK WAVE LITHOTRIPSY (ESWL);  Surgeon: Elisabeth Valli BIRCH, MD;  Location: [facility];  Service: Urology;  Laterality: Right;   INCISION AND DRAINAGE ABSCESS N/A 02/06/2018   Procedure: INCISION AND DRAINAGE vulvar ABSCESS;  Surgeon: Latisha Medford, MD;  Location: [facility];  Service: Gynecology;  Laterality: N/A;   MEDIASTINOSCOPY N/A 04/25/2017   Procedure: MEDIASTINOSCOPY;  Surgeon: Kerrin Elspeth BROCKS, MD;  Location: [facility] OR;  Service: Thoracic;  Laterality: N/A;   TONSILLECTOMY  04/02/2012   Procedure: TONSILLECTOMY;  Surgeon: Ana LELON Moccasin, MD;  Location: [facility] OR;  Service: ENT;  Laterality:  Bilateral;   Patient Active Problem List   Diagnosis Date Noted   Physical deconditioning 09/15/2024   Hyperlipidemia associated with type 2 diabetes mellitus (HCC) 09/15/2024   Hypertension associated with stage 3a chronic kidney disease due to type 2 diabetes mellitus (HCC) 09/15/2024   PTSD (post-traumatic stress disorder) 09/15/2024   Osteoarthritis of right knee 09/15/2024   Lumbar disc disease with radiculopathy 07/11/2023   Opioid use, unspecified with unspecified opioid-induced disorder (HCC) 07/08/2023   Pulmonary HTN (HCC) 05/30/2021   Atypical chest pain 12/22/2020   Elevated d-dimer 12/22/2020   GERD (gastroesophageal reflux disease) 12/22/2020   Obesity, Class III, BMI 40-49.9 (morbid obesity) (HCC) 12/22/2020   Chronic low back pain with bilateral sciatica 12/22/2020   OSA (obstructive sleep apnea) 07/11/2020   Nausea and vomiting 12/21/2019   Stage 3 chronic kidney disease (HCC) 12/21/2019   Lumbar spondylosis 10/13/2019   Type 2 diabetes mellitus with diabetic chronic kidney disease (HCC) 09/24/2019   Sarcoidosis 08/18/2019   Morbid obesity (HCC) 08/18/2019   Family history of sarcoidosis 08/18/2019   Hyperlipidemia 09/19/2013   Essential hypertension 09/19/2013   Dyslipidemia 09/19/2013   CTS (carpal tunnel syndrome) 11/27/2012    PCP: Macarthur Elouise SQUIBB, DO   REFERRING PROVIDER: Margrette Taft BRAVO, MD  REFERRING DIAG: [phone] (ICD-10-CM) - Chronic pain  of right knee  THERAPY DIAG:  Acute pain of right knee  Impaired functional mobility, balance, gait, and endurance  History of fall  Rationale for Evaluation and Treatment: Rehabilitation  ONSET DATE: Fall on Right knee in November  SUBJECTIVE:   SUBJECTIVE STATEMENT: Pt states right knee is popping and burning the last 2 days, 4/10 pain report. Pt hopes she has not torn the meniscus any more.   Eval: Pt states she had a fall in November and landed on the right knee, went to the hospital and  then to [facility]. Pt states she feels she is losing her balance and has been using a cane since returning home from the hospital in November. Pt states the right knee just gives out when she is walking. Pt has not had any falls since fall in November.  PERTINENT HISTORY: Diabetics HTN, controled with medication Back trouble, right side PAIN:  Are you having pain? Yes: NPRS scale: 4/10 this date, worst in last week, 10/10 at night Pain location: medial and lateral right knee Pain description: stabbing pain Aggravating factors: walking, squatting and bending Relieving factors: ice, sitting  PRECAUTIONS: None  RED FLAGS: None   WEIGHT BEARING RESTRICTIONS: No  FALLS:  Has patient fallen in last 6 months? Yes. Number of falls 1  LIVING ENVIRONMENT: Lives with: lives with their spouse Lives in: House/apartment Stairs: Yes: External: 1 steps; none Has following equipment at home: Single point cane and Walker - 2 wheeled  OCCUPATION: retired, neurosurgeon  PLOF: Independent and Independent with basic ADLs  PATIENT GOALS: decrease right knee pain, improve balance, and get back into gym (over 6 months ago, 24/7 fitness)  NEXT MD VISIT: none at this time  OBJECTIVE:  Note: Objective measures were completed at Evaluation unless otherwise noted.  DIAGNOSTIC FINDINGS: CLINICAL DATA:  Knee pain since falling 3 weeks ago. Meniscal injury.   EXAM: MRI OF THE RIGHT KNEE WITHOUT CONTRAST   TECHNIQUE: Multiplanar, multisequence MR imaging of the knee was performed. No intravenous contrast was administered.   COMPARISON:  Radiographs and CT of the right knee 09/12/2024.   FINDINGS: MENISCI   Medial meniscus:  Intact with normal morphology.   Lateral meniscus:  Intact with normal morphology.   LIGAMENTS   Cruciates: The anterior and posterior cruciate ligaments are intact.   Collaterals: Partial tear of the medial collateral ligament proximally with mild surrounding  soft tissue edema. No ligament retraction identified. The components of the lateral collateral ligament complex appear intact.   CARTILAGE   Patellofemoral: Patellofemoral chondral thinning, surface irregularity and osteophyte formation. Prominent subcortical cyst formation laterally in the femoral trochlea.   Medial: Moderate chondral thinning, surface irregularity and peripheral osteophyte formation. There is a prominent osteophyte projecting proximally from the posterolateral aspect of the medial tibial plateau.   Lateral: Mild chondral thinning with peripheral osteophyte formation.   MISCELLANEOUS   Joint: Moderate-sized joint effusion. No intra-articular loose bodies identified.   Popliteal Fossa: The popliteus muscle and tendon are intact. No significant Baker's cyst.   Extensor Mechanism: The visualized quadriceps and patellar tendons are intact.The patellar retinacula and medial patellofemoral ligament are intact.   Bones:  No acute or significant extra-articular osseous findings.   Other: Mild prepatellar subcutaneous edema. No evidence of focal fluid collection.   IMPRESSION: 1. Partial tear of the medial collateral ligament proximally. 2. The menisci, cruciate and lateral collateral ligaments are intact. 3. Moderate tricompartmental degenerative changes. No acute osseous findings. 4. Moderate-sized joint effusion.  PATIENT SURVEYS:  LEFS : 35 / 80 = 43.8 %   SENSATION: Light touch: Impaired , pt is on gabapentin  for burning of hands and feet  EDEMA:  Pt reports swelling has decreased since the fall.   PALPATION: Tenderness noted on R knee medial(worse) and lateral joint lines and medial and lateral poles of R patella  LOWER EXTREMITY ROM:  Active ROM Right eval Left eval Left 01/05/25:  Hip flexion     Hip extension     Hip abduction     Hip adduction     Hip internal rotation     Hip external rotation     Knee flexion 102 AROM 110  PROM  painful 118 118  Knee extension 11 from neutral in sitting, pain 11 from neutral in sitting 4  Ankle dorsiflexion     Ankle plantarflexion     Ankle inversion     Ankle eversion      (Blank rows = not tested)  LOWER EXTREMITY MMT:  MMT Right eval Left eval  Hip flexion 3 3+  Hip extension    Hip abduction 3+ 3+  Hip adduction 3+ 3+  Hip internal rotation    Hip external rotation    Knee flexion 3+ 5  Knee extension 3+ 5  Ankle dorsiflexion 4 4  Ankle plantarflexion    Ankle inversion    Ankle eversion     (Blank rows = not tested)  LOWER EXTREMITY SPECIAL TESTS:  Knee special tests: varus and valgus test, positive for MCL pain  FUNCTIONAL TESTS:  5 times sit to stand: 21.99 seconds, no increase in right knee pain 2 minute walk test: 238 feet, no increased in right knee pian, SPC used in left SLS 12/09/24: R: 5.75 s, slight pain in right knee L: 6.3 s   GAIT: Distance walked: 300 feet Assistive device utilized: Single point cane Level of assistance: Modified independence Comments: pt demonstrates                                                                                                                                 TREATMENT DATE:  01/07/2025  Therapeutic Exercise: -Stationary bike, 5 minutes, seat 14, pt cued for 45-55 SPM -Hamstring curl machine, 2 sets of 10 reps, plate 5 and 6, pt cued for eccentric control and RLE activation (15 reps first set) -LAQ, 2 sets of 10 reps, 3 lb ankle weights, pt cued for 3 second holds and eccentric control -Heel raises, 2 sets of 10 reps, bilaterally, pt cued for increased ROM and decreased pushing through Ues, in parallel bars -Standing calf stretch on incline, 1 set of 2 reps, 30 second holds, UE support on parallel bars, pt cued for alignment of shoulders, hips, knees, and ankles Therapeutic Activity: -Walking marches/butt kicks, 1 lap on 10 foot line, 3 lb ankle weights on ankles, pt cued for max ROM -Aeromat  walks, tandem walks and lateral steps,  3lb ankle weights, 2 laps each variation, pt cued for decreased UE support second rep of each  01/05/25: Supine: Bridge with ball squeeze (partial raise) 10x 5 LTR 5x 10 AROM 4-118 TKE with half bolster 10x 5 Sidelying:  Clam with RTB around thigh 10x 5 Sit to stand, elevated height to 24in, no HHA 10x, eccentric control Sidestep down hallway with UE support on wall 1RT with cueing for posture and to reduce ER  12/30/2024  Nustep seat 10 UE/LE level 4, 5 minutes Education on recruting core mm Supine:  TrA isometrics 10X5  Bridge (glute sets) 10X5  SLR 2X5 each LE  Seated: LAQ 10X each  Sit to stand standard chair with 2 foam in seat, no UE assist 5X       PATIENT EDUCATION:  Education details: Pt was educated on findings of PT evaluation, prognosis, frequency of therapy visits and rationale, attendance policy, and HEP if given.   Person educated: Patient Education method: Explanation, Verbal cues, and Handouts Education comprehension: verbalized understanding and needs further education  HOME EXERCISE PROGRAM: Access Code: [email or web address] URL: [email or web address] Date: 12/09/2024 Prepared by: Lang Ada  Exercises - Seated Long Arc Quad  - 1 x daily - 7 x weekly - 3 sets - 10 reps - Sit to Stand  - 1 x daily - 7 x weekly - 3 sets - 10 reps  01/05/25:  - Supine Bridge  - 2 x daily - 7 x weekly - 2 sets - 10 reps - 5 hold - Clam with Resistance  - 2 x daily - 7 x weekly - 2 sets - 10 reps - 5 hold  ASSESSMENT:  CLINICAL IMPRESSION: Patient continues to demonstrate increased pain in Right knee (with crepitus), decreased RLE strength, decreased gait quality and balance. Patient also demonstrates increased endurance with aerobic based exercise during today's session with ability to power stationary bike. Patient able to progress dynamic balance and R knee activation exercises today with aeromat walks and LAQs, good  performance with verbal cueing. Patient educated on importance of consistent HEP compliance and decreased impact activities as meniscus heals. Patient would continue to benefit from skilled physical therapy for decreased RLE pain, increased endurance with ambulation, increased LE strength/ROM, and improved balance for improved quality of life, improved independence with community ambulation and continued progress towards therapy goals.   Eval: Patient is a 64 y.o. female who was seen today for physical therapy evaluation and treatment for M25.561,G89.29 (ICD-10-CM) - Chronic pain of right knee. Patient demonstrates increased right knee pain, decreased RLE strength, abnormal pain rating with functional mobility, and impaired balance. Patient also demonstrates difficulty with ambulation during today's session with SPC decreased stride length and velocity noted. Pt educated on LUE recommendation for maximal offloading of R knee during ambulation. Patient also demonstrates tenderness on medial and lateral joint lines of R knee, will consider manual therapy for improved ROM and pain management. Patient requires education on POC, HEP recommendations and importance of compliance. Patient would benefit from skilled physical therapy for decreased pain in R knee, increased endurance with ambulation, increased RLE strength/ROM, and balance for improved gait quality, return to higher level of function with ADLs, and progress towards therapy goals.   OBJECTIVE IMPAIRMENTS: Abnormal gait, decreased activity tolerance, decreased balance, decreased endurance, decreased knowledge of condition, decreased knowledge of use of DME, decreased mobility, difficulty walking, decreased ROM, decreased strength, hypomobility, and pain.   ACTIVITY LIMITATIONS: carrying, lifting, bending, sitting, standing, squatting, stairs, transfers, and bed mobility  PARTICIPATION LIMITATIONS: meal prep, cleaning, laundry, driving, shopping,  community activity, and yard work  PERSONAL FACTORS: Age, Fitness, Past/current experiences, Time since onset of injury/illness/exacerbation, and 1 comorbidity: history of chronic right knee pain are also affecting patient's functional outcome.   REHAB POTENTIAL: Fair chronic in nature  CLINICAL DECISION MAKING: Stable/uncomplicated  EVALUATION COMPLEXITY: Low   GOALS: Goals reviewed with patient? No  SHORT TERM GOALS: Target date: 12/30/24  Patient will demonstrate evidence of independence with individualized HEP and will report compliance for at least 3 days per week for optimized progression towards remaining therapy goals. Baseline:  Goal status: INITIAL  2.  Patient will report a decrease in pain level during community ambulation by at least 2 points for improved quality of life. Baseline: 4/10 Goal status: INITIAL     LONG TERM GOALS: Target date: 01/20/25  Pt will demonstrate a an increase of at least 9 points on the LEFS for improved performance of community ambulation and ADL. Baseline: see objective Goal status: INITIAL  2.  Pt will improve 2 MWT by 40 feet with LRAD in order to demonstrate improved functional ambulatory capacity in community setting.  Baseline: see objective Goal status: INITIAL  3.  Pt will demonstrate increase of 7 degrees in ROM (flexion and extension) in right knee, for increased mobility and maximal efficiency of gait cycle during ambulation. Baseline: see objective Goal status: INITIAL  4.  Pt will demonstrate at least 4+/5 MMT for right lower extremity for increased strength during ADL and community ambulation. Baseline: see objective Goal status: INITIAL  5.  Pt will improve SLS by 7 seconds bilaterally in order to improve balance during functional activities. Baseline: see objective Goal status: INITIAL    PLAN:  PT FREQUENCY: 2x/week  PT DURATION: 6 weeks  PLANNED INTERVENTIONS: 97110-Therapeutic exercises, 97530-  Therapeutic activity, 97112- Neuromuscular re-education, 97535- Self Care, [id number]- Manual therapy, [phone]- Gait training, [phone] (1-2 muscles), 20561 (3+ muscles)- Dry Needling, Patient/Family education, Balance training, Stair training, Taping, Joint mobilization, Joint manipulation, DME instructions, Cryotherapy, and Moist heat  PLAN FOR NEXT SESSION: Progress quad and RLE strengthening, incorporate balance  Lang Ada, PT, DPT [facility] Office: [phone] 5:18 PM, 01/07/25    "

## 2025-01-12 ENCOUNTER — Ambulatory Visit (HOSPITAL_COMMUNITY)

## 2025-01-14 ENCOUNTER — Ambulatory Visit (HOSPITAL_COMMUNITY): Admitting: Physical Therapy

## 2025-01-14 ENCOUNTER — Telehealth (HOSPITAL_COMMUNITY): Payer: Self-pay | Admitting: Physical Therapy

## 2025-01-14 NOTE — Telephone Encounter (Signed)
 Pt  did not show for scheduled appt (2nd NS).called and left VM regarding  missed appt and reminder for next scheduled one.   Greig KATHEE Fuse, PTA/CLT [facility] Ph: [phone]

## 2025-01-18 ENCOUNTER — Ambulatory Visit (HOSPITAL_COMMUNITY): Payer: Medicare (Managed Care)

## 2025-01-19 ENCOUNTER — Ambulatory Visit (HOSPITAL_COMMUNITY): Payer: Medicare (Managed Care)

## 2025-01-21 ENCOUNTER — Ambulatory Visit (HOSPITAL_COMMUNITY): Payer: Medicare (Managed Care)

## 2025-01-26 ENCOUNTER — Ambulatory Visit (HOSPITAL_COMMUNITY): Payer: Medicare (Managed Care)

## 2025-01-28 ENCOUNTER — Ambulatory Visit (HOSPITAL_COMMUNITY)

## 2025-01-29 ENCOUNTER — Ambulatory Visit (HOSPITAL_COMMUNITY)

## 2025-02-02 ENCOUNTER — Ambulatory Visit (HOSPITAL_COMMUNITY): Payer: Medicare (Managed Care)

## 2025-02-05 ENCOUNTER — Ambulatory Visit (HOSPITAL_COMMUNITY)
